# Patient Record
Sex: Female | Born: 1935 | Race: White | Hispanic: No | Marital: Married | Smoking: Never smoker
Health system: Southern US, Community
[De-identification: ages and names within clinical notes are randomized; demographics above are authoritative.]

## PROBLEM LIST (undated history)

## (undated) DIAGNOSIS — F0390 Unspecified dementia without behavioral disturbance: Secondary | ICD-10-CM

## (undated) DIAGNOSIS — I10 Essential (primary) hypertension: Secondary | ICD-10-CM

## (undated) DIAGNOSIS — M1990 Unspecified osteoarthritis, unspecified site: Secondary | ICD-10-CM

## (undated) DIAGNOSIS — C55 Malignant neoplasm of uterus, part unspecified: Secondary | ICD-10-CM

## (undated) DIAGNOSIS — E119 Type 2 diabetes mellitus without complications: Secondary | ICD-10-CM

## (undated) HISTORY — PX: ABDOMINAL HYSTERECTOMY: SHX81

## (undated) HISTORY — PX: FRACTURE SURGERY: SHX138

## (undated) HISTORY — PX: EYE SURGERY: SHX253

---

## 2004-10-23 ENCOUNTER — Ambulatory Visit: Payer: Self-pay | Admitting: Ophthalmology

## 2004-10-29 ENCOUNTER — Ambulatory Visit: Payer: Self-pay | Admitting: Ophthalmology

## 2005-04-26 ENCOUNTER — Emergency Department: Payer: Self-pay | Admitting: Emergency Medicine

## 2005-04-26 ENCOUNTER — Other Ambulatory Visit: Payer: Self-pay

## 2006-01-03 ENCOUNTER — Emergency Department: Payer: Self-pay | Admitting: Emergency Medicine

## 2008-06-13 ENCOUNTER — Emergency Department: Payer: Self-pay | Admitting: Emergency Medicine

## 2008-09-15 ENCOUNTER — Ambulatory Visit: Payer: Self-pay | Admitting: Unknown Physician Specialty

## 2009-12-09 ENCOUNTER — Emergency Department: Payer: Self-pay | Admitting: Emergency Medicine

## 2010-01-15 ENCOUNTER — Ambulatory Visit: Payer: Self-pay | Admitting: Gynecologic Oncology

## 2011-02-11 ENCOUNTER — Ambulatory Visit: Payer: Self-pay | Admitting: Ophthalmology

## 2011-02-11 DIAGNOSIS — Z01810 Encounter for preprocedural cardiovascular examination: Secondary | ICD-10-CM

## 2011-02-18 ENCOUNTER — Ambulatory Visit: Payer: Self-pay | Admitting: Ophthalmology

## 2011-03-30 ENCOUNTER — Emergency Department: Payer: Self-pay | Admitting: Emergency Medicine

## 2011-03-30 LAB — URINALYSIS, COMPLETE
Bilirubin,UR: NEGATIVE
Nitrite: NEGATIVE
Ph: 5 (ref 4.5–8.0)
Protein: NEGATIVE
Squamous Epithelial: 4
WBC UR: 101 /HPF (ref 0–5)

## 2011-03-30 LAB — COMPREHENSIVE METABOLIC PANEL
Alkaline Phosphatase: 64 U/L (ref 50–136)
BUN: 23 mg/dL — ABNORMAL HIGH (ref 7–18)
Bilirubin,Total: 0.4 mg/dL (ref 0.2–1.0)
Calcium, Total: 9.4 mg/dL (ref 8.5–10.1)
Chloride: 101 mmol/L (ref 98–107)
Co2: 29 mmol/L (ref 21–32)
Creatinine: 0.75 mg/dL (ref 0.60–1.30)
EGFR (African American): >60
EGFR (Non-African Amer.): >60
SGOT(AST): 21 U/L (ref 15–37)
SGPT (ALT): 17 U/L

## 2011-03-30 LAB — DRUG SCREEN, URINE
Amphetamines, Ur Screen: NEGATIVE (ref ?–1000)
Barbiturates, Ur Screen: NEGATIVE (ref ?–200)
Cocaine Metabolite,Ur ~~LOC~~: NEGATIVE (ref ?–300)
Opiate, Ur Screen: NEGATIVE (ref ?–300)
Phencyclidine (PCP) Ur S: NEGATIVE (ref ?–25)
Tricyclic, Ur Screen: NEGATIVE (ref ?–1000)

## 2011-03-30 LAB — CBC
HCT: 35.7 % (ref 35.0–47.0)
HGB: 11.9 g/dL — ABNORMAL LOW (ref 12.0–16.0)
MCH: 29.1 pg (ref 26.0–34.0)
MCHC: 33.3 g/dL (ref 32.0–36.0)
MCV: 87 fL (ref 80–100)
Platelet: 277 10*3/uL (ref 150–440)

## 2011-03-30 LAB — TSH: Thyroid Stimulating Horm: 0.52 u[IU]/mL

## 2011-03-30 LAB — ETHANOL: Ethanol: <3 mg/dL

## 2011-04-01 LAB — URINE CULTURE

## 2011-09-06 ENCOUNTER — Emergency Department: Payer: Self-pay | Admitting: Internal Medicine

## 2013-04-14 ENCOUNTER — Inpatient Hospital Stay: Payer: Self-pay | Admitting: Internal Medicine

## 2013-04-14 LAB — COMPREHENSIVE METABOLIC PANEL
ALK PHOS: 84 U/L
AST: 39 U/L — AB (ref 15–37)
Albumin: 4 g/dL (ref 3.4–5.0)
Anion Gap: 5 — ABNORMAL LOW (ref 7–16)
BUN: 13 mg/dL (ref 7–18)
Bilirubin,Total: 1.3 mg/dL — ABNORMAL HIGH (ref 0.2–1.0)
CO2: 30 mmol/L (ref 21–32)
Calcium, Total: 9.3 mg/dL (ref 8.5–10.1)
Chloride: 99 mmol/L (ref 98–107)
Creatinine: 0.75 mg/dL (ref 0.60–1.30)
EGFR (African American): >60
EGFR (Non-African Amer.): >60
Glucose: 147 mg/dL — ABNORMAL HIGH (ref 65–99)
Osmolality: 271 (ref 275–301)
POTASSIUM: 3.4 mmol/L — AB (ref 3.5–5.1)
SGPT (ALT): 29 U/L (ref 12–78)
Sodium: 134 mmol/L — ABNORMAL LOW (ref 136–145)
Total Protein: 7.9 g/dL (ref 6.4–8.2)

## 2013-04-14 LAB — URINALYSIS, COMPLETE
Bilirubin,UR: NEGATIVE
Glucose,UR: 50 mg/dL (ref 0–75)
KETONE: NEGATIVE
Leukocyte Esterase: NEGATIVE
Nitrite: NEGATIVE
Ph: 7 (ref 4.5–8.0)
Protein: NEGATIVE
RBC,UR: 3 /HPF (ref 0–5)
Specific Gravity: 1.013 (ref 1.003–1.030)
Squamous Epithelial: NONE SEEN
WBC UR: 11 /HPF (ref 0–5)

## 2013-04-14 LAB — CBC
HCT: 40.7 % (ref 35.0–47.0)
HGB: 13.4 g/dL (ref 12.0–16.0)
MCH: 29 pg (ref 26.0–34.0)
MCHC: 32.9 g/dL (ref 32.0–36.0)
MCV: 88 fL (ref 80–100)
Platelet: 171 10*3/uL (ref 150–440)
RBC: 4.63 10*6/uL (ref 3.80–5.20)
RDW: 13.2 % (ref 11.5–14.5)
WBC: 8.2 10*3/uL (ref 3.6–11.0)

## 2013-04-14 LAB — PROTIME-INR
INR: 1
PROTHROMBIN TIME: 13.2 s (ref 11.5–14.7)

## 2013-04-15 LAB — CBC WITH DIFFERENTIAL/PLATELET
BASOS PCT: 0.1 %
Basophil #: 0 10*3/uL (ref 0.0–0.1)
EOS ABS: 0 10*3/uL (ref 0.0–0.7)
Eosinophil %: 0 %
HCT: 36.5 % (ref 35.0–47.0)
HGB: 12.6 g/dL (ref 12.0–16.0)
LYMPHS ABS: 0.6 10*3/uL — AB (ref 1.0–3.6)
LYMPHS PCT: 7.2 %
MCH: 30.1 pg (ref 26.0–34.0)
MCHC: 34.5 g/dL (ref 32.0–36.0)
MCV: 87 fL (ref 80–100)
MONO ABS: 0.4 x10 3/mm (ref 0.2–0.9)
Monocyte %: 4.5 %
NEUTROS PCT: 88.2 %
Neutrophil #: 7.2 10*3/uL — ABNORMAL HIGH (ref 1.4–6.5)
Platelet: 164 10*3/uL (ref 150–440)
RBC: 4.17 10*6/uL (ref 3.80–5.20)
RDW: 13.3 % (ref 11.5–14.5)
WBC: 8.1 10*3/uL (ref 3.6–11.0)

## 2013-04-15 LAB — BASIC METABOLIC PANEL
ANION GAP: 7 (ref 7–16)
BUN: 14 mg/dL (ref 7–18)
CHLORIDE: 101 mmol/L (ref 98–107)
CO2: 25 mmol/L (ref 21–32)
Calcium, Total: 9.1 mg/dL (ref 8.5–10.1)
Creatinine: 0.86 mg/dL (ref 0.60–1.30)
EGFR (Non-African Amer.): >60
GLUCOSE: 122 mg/dL — AB (ref 65–99)
OSMOLALITY: 268 (ref 275–301)
Potassium: 3.4 mmol/L — ABNORMAL LOW (ref 3.5–5.1)
Sodium: 133 mmol/L — ABNORMAL LOW (ref 136–145)

## 2013-04-15 LAB — MAGNESIUM: Magnesium: 1.5 mg/dL — ABNORMAL LOW

## 2013-04-16 LAB — BASIC METABOLIC PANEL
ANION GAP: 5 — AB (ref 7–16)
BUN: 13 mg/dL (ref 7–18)
Calcium, Total: 8.4 mg/dL — ABNORMAL LOW (ref 8.5–10.1)
Chloride: 104 mmol/L (ref 98–107)
Co2: 26 mmol/L (ref 21–32)
Creatinine: 0.78 mg/dL (ref 0.60–1.30)
EGFR (African American): >60
EGFR (Non-African Amer.): >60
GLUCOSE: 108 mg/dL — AB (ref 65–99)
Osmolality: 271 (ref 275–301)
Potassium: 3.6 mmol/L (ref 3.5–5.1)
SODIUM: 135 mmol/L — AB (ref 136–145)

## 2013-04-16 LAB — MAGNESIUM: Magnesium: 1.7 mg/dL — ABNORMAL LOW

## 2013-04-16 LAB — URINE CULTURE

## 2013-04-16 LAB — HEMOGLOBIN: HGB: 10.6 g/dL — ABNORMAL LOW (ref 12.0–16.0)

## 2013-04-16 LAB — PLATELET COUNT: PLATELETS: 124 10*3/uL — AB (ref 150–440)

## 2013-04-17 ENCOUNTER — Ambulatory Visit: Payer: Self-pay | Admitting: Neurology

## 2013-04-21 LAB — PATHOLOGY REPORT

## 2014-07-08 NOTE — Op Note (Signed)
PATIENT NAME:  Melinda Hughes, BORRAYO MR#:  [id number] DATE OF BIRTH:  Oct 13, 1931  DATE OF PROCEDURE:  04/15/2013  PREOPERATIVE DIAGNOSES: Left femoral neck fracture and osteoarthritis.   POSTOPERATIVE DIAGNOSES: Left femoral neck fracture and osteoarthritis.   PROCEDURE: Left total hip replacement.   ANESTHESIA: Spinal.   SURGEON: Hessie Knows, M.D.   DESCRIPTION OF PROCEDURE: The patient was brought to the Operating Room and, after adequate anesthesia was obtained, the patient was placed on the operative table with the right leg on a well padded table, left foot in the Medacta attachment. After appropriate positioning, C-arm was brought in, and with traction view, initial landmarks were obtained for subsequent procedure in comparison with implants. The hip was then prepped and draped using the usual sterile fashion. Appropriate patient identification and timeout procedures were completed.   Direct anterior approach was made, centered over the tensor fascia lata muscle and the greater trochanter. The tensor fascia was incised and the muscle retracted laterally. The deep fascia was then incised and the lateral femoral circumflex vessels were identified and ligated. The anterior capsule was then exposed, and an anterior flap was created, based superior laterally, and a deep Charnley retractor placed. Femoral neck cut was carried out just distal to the fracture site, and the head was removed.   There was moderately advanced femoral head osteoarthritis. Acetabulum was significant for moderate synovitis. A portion of the labrum was excised to give adequate exposure of the acetabulum, and sequential reaming was carried out to 50 mm. A 50 mm trial fit well, and the 50 mm Versafit cup DM was impacted into place.   Next, the leg was externally rotated, and the ischiofemoral and pubofemoral ligaments were released. The leg was then dropped into extension with adduction, and sequential broaching was carried out to a #6  AMIS stem, which had a very nice fit proximally. With the #6 stem in place, broach in place, trials were obtained, and comparison to the initial films made, with an M 28 mm head and the appropriate liner for the 50 mm cup. The #6 stem was impacted down the canal, and bipolar dual mobility head was then impacted onto the stem. The hip was reduced, and the leg lengths appeared appropriate. The wound was thoroughly irrigated. The hip was stable to a 90 degree external rotation test. The wound was then closed with a running heavy Quill, 2-0 Quill subcutaneously and skin staples. Xeroform, 4 x 4's, ABD and tape applied.   ESTIMATED BLOOD LOSS: 350 mL.   COMPLICATIONS: None.   SPECIMEN: Removed femoral head.   IMPLANTS: Medacta AMIS size 6 standard stem with a 50 mm dual mobility cup with appropriate liner and an M 28 mm head.    ____________________________ Laurene Footman, MD mjm:cg D: 04/15/2013 20:46:15 ET T: 04/16/2013 06:54:57 ET JOB#: [id number]  cc: Laurene Footman, MD, <Dictator> Laurene Footman MD ELECTRONICALLY SIGNED 04/18/2013 8:24

## 2014-07-08 NOTE — Discharge Summary (Signed)
PATIENT NAME:  Melinda Hughes, Melinda Hughes MR#:  [id number] DATE OF BIRTH:  1932-02-14  DATE OF ADMISSION:  04/14/2013 DATE OF DISCHARGE:  04/18/2013  ADDENDUM TO DISCHARGE SUMMARY   DISCHARGE MEDICATIONS: Correct lisinopril 20 mg daily. Remainder of drugs remain unchanged.    ____________________________ Venetia Maxon Elijio Miles, MD sat:mr D: 04/18/2013 14:19:00 ET T: 04/18/2013 20:01:31 ET JOB#: [id number]  cc: Sheikh A. Elijio Miles, MD, <Dictator> Veverly Fells MD ELECTRONICALLY SIGNED 04/19/2013 13:59

## 2014-07-08 NOTE — Discharge Summary (Signed)
PATIENT NAME:  Melinda Hughes, Melinda Hughes MR#:  [id number] DATE OF BIRTH:  1932-03-03  DATE OF ADMISSION:  04/14/2013 DATE OF DISCHARGE:  04/18/2013  DISCHARGING PHYSICIAN:  Dr. Elijio Miles.   CONSULTATIONS:  Orthopedics.   DIAGNOSIS:  Left hip fracture.   PROCEDURE: Left hip internal fixation.   DISCHARGE MEDICATIONS:  1.  Oxycodone 5 mg 2 tabs every 4 hours p.r.n. for pain.  2.  Tramadol 50 mg 1 to 2 tabs every 4 hours for pain.  3.  Enoxaparin 30 mg subcutaneous daily.  4.  Lisinopril 10 mg daily.   HOSPITAL COURSE: This lady with past medical history of Alzheimer dementia sustained a mechanical fall and fractured her left hip. Please refer to the history and physical for full details. She was diagnosed a left hip fracture. Orthopedic consultation was placed, and the patient underwent a left hip internal fixation on 04/15/2013 by Dr. Marry Guan. The procedure was uncomplicated, notable for blood loss anemia, which did not require blood transfusion.   She had a syncopal episode, was on the commode with tonic-clonic movement of right limbs due to a possible vasovagal event. Neurology consultation was placed. CT scan of the head was negative for acute intracranial abnormality. Neurology has not deemed any further neuroimaging necessary. The patient remained stable subsequently without any further syncope or tonic-clonic movements. The patient'Hoke Baer blood pressure was elevated on admission. The blood pressure medications were adjusted, namely several additional antihypertensives were introduced to her care; however, following her surgery, the patient'Mane Consolo blood pressure normalized. She was taken off her hydrochlorothiazide, as she was hyponatremic at the time of admission and her blood pressure was controlled with lisinopril.   The patient was discharged to a skilled nursing facility in satisfactory condition.   DISCHARGE INSTRUCTIONS:  Diet: Low sodium. Activity: The patient is to continue physical therapy and rehab.    Follow up with orthopedics in the skilled nursing facility and follow up with her primary care physician, Dr. Lamonte Sakai, in 1 to 2 weeks following discharge from skilled nursing.   DISCHARGE PROCESS TIME SPENT: 33 minutes.   ____________________________ Venetia Maxon Elijio Miles, MD sat:dmm D: 04/18/2013 14:10:36 ET T: 04/18/2013 19:41:18 ET JOB#: [id number]  cc: Sheikh A. Elijio Miles, MD, <Dictator> Veverly Fells MD ELECTRONICALLY SIGNED 04/19/2013 13:59

## 2014-07-08 NOTE — Consult Note (Signed)
Brief Consult Note: Diagnosis: displaced left femoral neck fracture.   Orders entered.   Comments: plan left [facility] tomorrow with anterior approach will discuss with family in am.  Electronic Signatures: Laurene Footman (MD)  (Signed 29-Jan-15 20:48)  Authored: Brief Consult Note   Last Updated: 29-Jan-15 20:48 by Laurene Footman (MD)

## 2014-07-08 NOTE — H&P (Signed)
PATIENT NAME:  Melinda Hughes, Melinda Hughes MR#:  [id number] DATE OF BIRTH:  [birth year]  DATE OF ADMISSION:  04/14/2013  ADMITTING PHYSICIAN: Samantha Crimes, M.D.   PRIMARY CARE PHYSICIAN: Lamonte Sakai.   CHIEF COMPLAINT: Fall and left hip pain.   HISTORY OF PRESENT ILLNESS: Melinda Hughes is a 81 year old Caucasian female with past medical history significant for hypertension, diet-controlled diabetes mellitus, dementia, arthritis  and gastroesophageal reflux disease, who lives at home with her husband, was brought in secondary to a fall that she had today. The patient has dementia and also received pain medication and most of the history is obtained from husband at bedside. According to him, the patient is usually well balanced when she walks. Her trouble is only with short-term memory. She recognizes family, able to maintain a conversation. However, she lost her balance and tripped on something and fell. The husband denies any loss of consciousness hitting her head. After the fall she complained of left hip pain and was brought to the ER, and x-ray showed an angulated, impacted left hip intertrochanteric fracture. According to the husband, the patient never complained of any chest pain or shortness of breath. She ambulates well and can also climb steps, at least 1 flight of stairs. She does not have any cardiac history. The patient only has medication for hypertension at home, which has not been changed in the last couple of years. They have been calling PCP's office just to get refill of the medication. Her blood pressure was elevated in the ER at 236/105.   PAST MEDICAL HISTORY: 1.  Hypertension.  2.  Dementia.  3.  Diet-controlled diabetes mellitus.  4.  Gastroesophageal reflux disease.  5.  Osteoarthritis.  6.  Colitis as a child.   PAST SURGICAL HISTORY: 1.  Bilateral cataract surgery.  2.  Hysterectomy done endometrial cancer at [facility]: ASPIRIN, CELEBREX, CODEINE,  MORPHINE, MOTRIN, SULFA DRUGS, VIOXX.  CURRENT MEDICATIONS:  Lisinopril/hydrochlorothiazide 10/12.5 mg 1 tablet p.o. daily.   SOCIAL HISTORY: Lives at home with her husband. Ambulates well at baseline. No history of any smoking or alcohol use.   FAMILY HISTORY: Heart disease runs in the family.   REVIEW OF SYSTEMS: Difficult to be obtained as the patient has dementia and also currently sedated with pain medications.   PHYSICAL EXAMINATION: VITAL SIGNS: Temperature 98.2 degrees Fahrenheit, pulse 68, respirations 18, blood pressure 236/105, pulse ox 98% on room air.  GENERAL: Well-built, well-nourished female lying in bed, not in any acute distress.  HEENT: Normocephalic, atraumatic. Pupils postsurgical, equal, round, reacting to light. Anicteric sclerae. Extraocular movements intact. Oropharynx clear without erythema, mass or exudates.  NECK: Supple. No thyromegaly, JVD or carotid bruits. No lymphadenopathy.  LUNGS: Moving air bilaterally. No wheeze or crackles. No use of accessory muscles for breathing. Decreased bibasilar breath sounds.  CARDIOVASCULAR: S1, S2 regular rate and rhythm. 2/6 systolic murmur heard. No rubs or gallops.  ABDOMEN: Soft, nontender, nondistended. No hepatosplenomegaly. Normal bowel sounds.  EXTREMITIES: Left hip is externally rotated and extended and placed in guarding position. No bruits or ecchymosis noted in the hip area. Pulses are 2+ palpable bilaterally. No clubbing or cyanosis. No pedal edema noted.  SKIN: No acne, rash or lesions.  LYMPHATICS: No cervical lymphadenopathy.  NEUROLOGIC: Cranial nerves II through XII remain intact. Motor strength is 5/5 in all 3 extremities; left leg not tested due to her fracture.  Sensation seems to be intact.  PSYCHOLOGICAL: The patient is alert, oriented x 2 at  this time.   LABORATORY DATA: WBC 8.3, hemoglobin 13.4, hematocrit 40.7, platelet count 171.   Sodium 134, potassium 3.4, chloride 99, bicarbonate 30, BUN 13,  creatinine 0.75, glucose 147 and calcium of 9.3.   ALT 29, AST 39, alk phos 34, total bili 1.3 and albumin of 4.0. INR 1.0. Urinalysis with few WBCs and 1+ bacteria.  CT of the head without contrast showing no acute intracranial process and moderate white matter changes suggesting chronic small vessel ischemic disease with right thalamic lacunar infarct and bilateral temporal encephalomalacia noted, which is chronic. Left hip x-ray showing angulated impacted left femoral subcapital fracture without dislocation, and chest x-ray showing cardiomegaly and hyperinflation. No acute process seen.  EKG showing normal sinus rhythm, heart rate of 69. No acute ST-T wave abnormalities noted.   ASSESSMENT AND PLAN: This is a 81 year old female with past medical history significant for hypertension, dementia, brought in after a fall and noted to have a left hip fracture.  1.  Preop eval for left hip fracture, active at baseline with no cardiac history. Blood pressure is elevated. We will need to optimize prior to surgery. Otherwise, intermediate risk and can  proceed with surgery as benefits outweigh the risks. Ortho has been consulted. Postop pain control and physical therapy after surgery.  2.  Malignant hypertension. Started on IV hydralazine p.r.n. and also continue on lisinopril, added metoprolol and Norvasc. Hydrochlorothiazide held, as the patient appears dehydrated and also hypokalemic.  3.  Severe dementia, appears to be at baseline.  4.  Urinary tract infection. We will give fluids and start her on Levaquin empirically.  5.  Deep vein thrombosis prophylaxis will be started after surgery, as per ortho recommendations.   TOTAL TIME SPENT ON ADMISSION: 50 minutes.     ____________________________ Gladstone Lighter, MD rk:dmm D: 04/14/2013 21:28:13 ET T: 04/14/2013 22:00:52 ET JOB#: [id number]  cc: Gladstone Lighter, MD, <Dictator> Laurene Footman, MD Perrin Maltese, MD Gladstone Lighter  MD ELECTRONICALLY SIGNED 04/21/2013 14:57

## 2014-07-08 NOTE — Consult Note (Signed)
PATIENT NAME:  Melinda Hughes, WISWELL MR#:  [id number] DATE OF BIRTH:  [birth year]  DATE OF CONSULTATION:  04/17/2013  REFERRING PHYSICIAN:   CONSULTING PHYSICIAN:  Leotis Pain, MD  REASON FOR CONSULTATION:  Syncopal event, rule out seizure activity.   HISTORY OF PRESENT ILLNESS:  A pleasant 81 year old Caucasian female with past medical history of hypertension, diabetes, arthritis, gastroesophageal reflux disease, lives at home at baseline with history of dementia, presents status post fall. The patient was found to have left hip fracture, status post total hip replacement. Reason for neurological evaluation is the patient is status post what appears to be a syncopal event. Information is obtained from patient's daughter, who was at bedside. The patient's daughter states that the patient was sitting on the commode bearing down and then she appeared to slump down, slide off the commode, had stiffening event of her bilateral upper extremities. She needed to be assisted to the bed and once she was assisted to the bed the patient was back to her baseline, her normal self. No tongue biting, urinary incontinence or any generalized shaking activity. No prior history of similar event in the past.   REVIEW OF SYSTEMS: Unable to obtain due to the patient's chronic history of dementia.   PAST MEDICAL HISTORY: Hypertension, dementia, diabetes, gastroesophageal reflux disease, osteoarthritis, colitis as a child.   PAST SURGICAL HISTORY: Bilateral cataracts, hysterectomy, recent left total hip replacement due to her fall.   MEDICATIONS: At home lisinopril/hydrochlorothiazide 10/12.5 daily.   SOCIAL HISTORY: Lives at home with her husband. Has history of dementia.   FAMILY HISTORY:  Heart disease runs in the family.   LABORATORY, DIAGNOSTIC AND RADIOLOGICAL DATA:  CAT scan of the head that was done after the fall did not show any acute intracranial abnormality. Glucose 108, BUN 13, creatinine is 78, sodium 135,  potassium 3.6, mag is low at 1.7 and should be replaced.   PHYSICAL EXAMINATION: VITAL SIGNS: Temperature 99, pulse 70, respirations 18, blood pressure 131/72, pulse ox 95%.  NEUROLOGICAL: The patient is sleepy but easily arousable. Can tell me her name, appears to be confused to date, time. Extraocular movements are intact. Visual fields appear intact. Facial sensation intact. Tongue is midline. Shoulder shrug intact. Motor strength is symmetrical bilateral upper extremities. Unable to assess because of left total hip replacement in the left lower extremity and within normal limits, about 4+ out of 5 right lower extremity. Gait not assessed. Coordination intact. Reflexes not assessed.   IMPRESSION: A 81 year old Caucasian female with past medical history of hypertension, diet-controlled diabetes, dementia, arthritis, gastroesophageal reflux disease, presenting status post fall, found to have a left hip fracture, status post total hip replacement. The patient was sitting down on a commode today, bore down and what appears to be a syncopal event. It appears that the patient's history is consistent with possibly convulsive syncope. I agree with discontinuing tramadol as it does lower seizure threshold. She did not have any tongue biting, urinary incontinence. No prior to history of seizure in the past. She probably had preload reduction due to bearing down and the patient came back to her herself very rapidly.   PLAN: No need for EEG monitoring nor antiepileptic medication. No further imaging from a neurological standpoint as, again, believe this is syncope, probably convulsive syncope in nature. This case discussed with the patient's family members who are at bedside.   Thank you. Please call with any questions.    ____________________________ Leotis Pain, MD yz:cs D: 04/17/2013 13:56:27 ET T:  04/17/2013 15:55:02 ET JOB#: [id number]  cc: Leotis Pain, MD, <Dictator> Leotis Pain  MD ELECTRONICALLY SIGNED 05/09/2013 13:12
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
## 2014-07-14 DIAGNOSIS — R21 Rash and other nonspecific skin eruption: Secondary | ICD-10-CM | POA: Diagnosis not present

## 2014-12-14 ENCOUNTER — Encounter: Payer: Self-pay | Admitting: Emergency Medicine

## 2014-12-14 ENCOUNTER — Emergency Department: Payer: Medicare Other

## 2014-12-14 ENCOUNTER — Emergency Department
Admission: EM | Admit: 2014-12-14 | Discharge: 2014-12-14 | Disposition: A | Discharge status: Home / Self Care | Payer: Medicare Other | Attending: Emergency Medicine | Admitting: Emergency Medicine

## 2014-12-14 DIAGNOSIS — I10 Essential (primary) hypertension: Secondary | ICD-10-CM | POA: Insufficient documentation

## 2014-12-14 DIAGNOSIS — S63501A Unspecified sprain of right wrist, initial encounter: Secondary | ICD-10-CM | POA: Diagnosis not present

## 2014-12-14 DIAGNOSIS — Y9289 Other specified places as the place of occurrence of the external cause: Secondary | ICD-10-CM | POA: Insufficient documentation

## 2014-12-14 DIAGNOSIS — Y9389 Activity, other specified: Secondary | ICD-10-CM | POA: Diagnosis not present

## 2014-12-14 DIAGNOSIS — E119 Type 2 diabetes mellitus without complications: Secondary | ICD-10-CM | POA: Insufficient documentation

## 2014-12-14 DIAGNOSIS — S6991XA Unspecified injury of right wrist, hand and finger(s), initial encounter: Secondary | ICD-10-CM | POA: Diagnosis present

## 2014-12-14 DIAGNOSIS — F0390 Unspecified dementia without behavioral disturbance: Secondary | ICD-10-CM | POA: Insufficient documentation

## 2014-12-14 DIAGNOSIS — W010XXA Fall on same level from slipping, tripping and stumbling without subsequent striking against object, initial encounter: Secondary | ICD-10-CM | POA: Diagnosis not present

## 2014-12-14 DIAGNOSIS — Y998 Other external cause status: Secondary | ICD-10-CM | POA: Insufficient documentation

## 2014-12-14 HISTORY — DX: Essential (primary) hypertension: I10

## 2014-12-14 HISTORY — DX: Type 2 diabetes mellitus without complications: E11.9

## 2014-12-14 HISTORY — DX: Unspecified osteoarthritis, unspecified site: M19.90

## 2014-12-14 HISTORY — DX: Unspecified dementia, unspecified severity, without behavioral disturbance, psychotic disturbance, mood disturbance, and anxiety: F03.90

## 2014-12-14 NOTE — ED Provider Notes (Signed)
[facility] Emergency Department Provider Note    ____________________________________________  Time seen: On EMS arrival  I have reviewed the triage vital signs and the nursing notes.   HISTORY  Chief Complaint Fall, right wrist pain  History limited by: Dementia   HPI Melinda Hughes is a 81 y.o. female who presents to the emergency department today via EMS after a fall. The patient does have a history of dementia is unable to give much history. However EMS does state that the patient had a fall after being tripped up by the dog. She fell backwards and put her right hand out to catch herself. She was complaining of right wrist pain since the accident. It is mild. The patient did not have any loss of consciousness or hit her head. Patient herself states that she currently has no pain.  Past Medical History  Diagnosis Date  . Dementia   . Diabetes mellitus without complication   . Hypertension   . Arthritis   . Cancer determined by uterine cervix biopsy     There are no active problems to display for this patient.   Past Surgical History  Procedure Laterality Date  . Abdominal hysterectomy    . Eye surgery    . Fracture surgery      No current outpatient prescriptions on file.  Allergies Morphine and related and Sulfa antibiotics  History reviewed. No pertinent family history.  Social History Social History  Substance Use Topics  . Smoking status: Never Smoker   . Smokeless tobacco: None  . Alcohol Use: No    Review of Systems  Constitutional: Negative for fever. Cardiovascular: Negative for chest pain. Respiratory: Negative for shortness of breath. Gastrointestinal: Negative for abdominal pain, vomiting and diarrhea. Genitourinary: Negative for dysuria. Musculoskeletal: Right wrist pain Skin: Negative for rash. Neurological: Negative for headaches, focal weakness or numbness.  10-point ROS otherwise  negative.  ____________________________________________   PHYSICAL EXAM:  VITAL SIGNS:   97.7 F (36.5 C)  70  --   193/93 mmHg  98 %     Constitutional: Awake and alert, pleasantly demented Eyes: Conjunctivae are normal. PERRL. Normal extraocular movements. ENT   Head: Normocephalic and atraumatic.   Nose: No congestion/rhinnorhea.   Mouth/Throat: Mucous membranes are moist.   Neck: No stridor. No midline tenderness Hematological/Lymphatic/Immunilogical: No cervical lymphadenopathy. Cardiovascular: Normal rate, regular rhythm.  No murmurs, rubs, or gallops. Respiratory: Normal respiratory effort without tachypnea nor retractions. Breath sounds are clear and equal bilaterally. No wheezes/rales/rhonchi. Gastrointestinal: Soft and nontender. No distention.  Genitourinary: Deferred Musculoskeletal: Normal range of motion in all extremities. No joint effusions.  No lower extremity tenderness nor edema. Some tenderness to palpation of the right wrist. No obvious deformity. Skin intact. Pelvis stable Neurologic:  Normal speech and language. No gross focal neurologic deficits are appreciated. Speech is normal.  Skin:  Skin is warm, dry and intact. No rash noted. Psychiatric: Pleasantly demented ____________________________________________    LABS (pertinent positives/negatives)  None  ____________________________________________   EKG  None  ____________________________________________    RADIOLOGY  Right wrist x-ray IMPRESSION: 1. No acute radiographic abnormality of the right wrist. 2. Extensive degenerative changes of osteoarthritis, as above.  I, GOODMAN, GRAYDON, personally viewed and evaluated these images (plain radiographs) as part of my medical decision making. ____________________________________________   PROCEDURES  Procedure(s) performed: None  Critical Care performed: No  ____________________________________________   INITIAL  IMPRESSION / ASSESSMENT AND PLAN / ED COURSE  Pertinent labs & imaging results that were  available during my care of the patient were reviewed by me and considered in my medical decision making (see chart for details).  Patient plan of right wrist pain after a fall. X-rays without any fractures. Think likely this patient suffered sprain  ____________________________________________   FINAL CLINICAL IMPRESSION(S) / ED DIAGNOSES  Final diagnoses:  Wrist sprain, right, initial encounter     Nance Pear, MD 12/14/14 [id number]

## 2014-12-14 NOTE — Discharge Instructions (Signed)
Please seek medical attention for any high fevers, chest pain, shortness of breath, change in behavior, persistent vomiting, bloody stool or any other new or concerning symptoms.    Wrist Pain Wrist injuries are frequent in adults and children. A sprain is an injury to the ligaments that hold your bones together. A strain is an injury to muscle or muscle cord-like structures (tendons) from stretching or pulling. Generally, when wrists are moderately tender to touch following a fall or injury, a break in the bone (fracture) may be present. Most wrist sprains or strains are better in 3 to 5 days, but complete healing may take several weeks. HOME CARE INSTRUCTIONS   Put ice on the injured area.  Put ice in a plastic bag.  Place a towel between your skin and the bag.  Leave the ice on for 15-20 minutes, 3-4 times a day, for the first 2 days, or as directed by your health care provider.  Keep your arm raised above the level of your heart whenever possible to reduce swelling and pain.  Rest the injured area for at least 48 hours or as directed by your health care provider.  If a splint or elastic bandage has been applied, use it for as long as directed by your health care provider or until seen by a health care provider for a follow-up exam.  Only take over-the-counter or prescription medicines for pain, discomfort, or fever as directed by your health care provider.  Keep all follow-up appointments. You may need to follow up with a specialist or have follow-up X-rays. Improvement in pain level is not a guarantee that you did not fracture a bone in your wrist. The only way to determine whether or not you have a broken bone is by X-ray. SEEK IMMEDIATE MEDICAL CARE IF:   Your fingers are swollen, very red, white, or cold and blue.  Your fingers are numb or tingling.  You have increasing pain.  You have difficulty moving your fingers. MAKE SURE YOU:   Understand these instructions.  Will  watch your condition.  Will get help right away if you are not doing well or get worse. Document Released: 12/11/2004 Document Revised: 03/08/2013 Document Reviewed: 04/24/2010 [facility] Patient Information 2015 [place], [place]. This information is not intended to replace advice given to you by your health care provider. Make sure you discuss any questions you have with your health care provider.

## 2014-12-14 NOTE — ED Notes (Signed)
Ems from home s/p fall. Pt with dementia, poor historian. Fall was unwitnessed per husband. Pt c/o right wrist pain.

## 2015-05-31 ENCOUNTER — Emergency Department: Payer: Medicare Other

## 2015-05-31 ENCOUNTER — Emergency Department
Admission: EM | Admit: 2015-05-31 | Discharge: 2015-05-31 | Disposition: A | Discharge status: Home / Self Care | Payer: Medicare Other | Attending: Emergency Medicine | Admitting: Emergency Medicine

## 2015-05-31 DIAGNOSIS — S0591XA Unspecified injury of right eye and orbit, initial encounter: Secondary | ICD-10-CM | POA: Insufficient documentation

## 2015-05-31 DIAGNOSIS — F0390 Unspecified dementia without behavioral disturbance: Secondary | ICD-10-CM | POA: Diagnosis not present

## 2015-05-31 DIAGNOSIS — R410 Disorientation, unspecified: Secondary | ICD-10-CM | POA: Diagnosis not present

## 2015-05-31 DIAGNOSIS — R531 Weakness: Secondary | ICD-10-CM | POA: Diagnosis not present

## 2015-05-31 DIAGNOSIS — E119 Type 2 diabetes mellitus without complications: Secondary | ICD-10-CM | POA: Diagnosis not present

## 2015-05-31 DIAGNOSIS — Y9389 Activity, other specified: Secondary | ICD-10-CM | POA: Diagnosis not present

## 2015-05-31 DIAGNOSIS — Y9289 Other specified places as the place of occurrence of the external cause: Secondary | ICD-10-CM | POA: Diagnosis not present

## 2015-05-31 DIAGNOSIS — Y998 Other external cause status: Secondary | ICD-10-CM | POA: Insufficient documentation

## 2015-05-31 DIAGNOSIS — I10 Essential (primary) hypertension: Secondary | ICD-10-CM | POA: Diagnosis not present

## 2015-05-31 DIAGNOSIS — W1839XA Other fall on same level, initial encounter: Secondary | ICD-10-CM | POA: Diagnosis not present

## 2015-05-31 DIAGNOSIS — W19XXXA Unspecified fall, initial encounter: Secondary | ICD-10-CM

## 2015-05-31 LAB — COMPREHENSIVE METABOLIC PANEL
ALBUMIN: 3.9 g/dL (ref 3.5–5.0)
ALT: 13 U/L — ABNORMAL LOW (ref 14–54)
AST: 22 U/L (ref 15–41)
Alkaline Phosphatase: 75 U/L (ref 38–126)
Anion gap: 7 (ref 5–15)
BUN: 33 mg/dL — ABNORMAL HIGH (ref 6–20)
CHLORIDE: 106 mmol/L (ref 101–111)
CO2: 25 mmol/L (ref 22–32)
Calcium: 9.3 mg/dL (ref 8.9–10.3)
Creatinine, Ser: 0.87 mg/dL (ref 0.44–1.00)
GFR calc Af Amer: >60 mL/min (ref >60)
GFR calc non Af Amer: >60 mL/min (ref >60)
GLUCOSE: 128 mg/dL — AB (ref 65–99)
POTASSIUM: 4.1 mmol/L (ref 3.5–5.1)
SODIUM: 138 mmol/L (ref 135–145)
TOTAL PROTEIN: 7.2 g/dL (ref 6.5–8.1)
Total Bilirubin: 0.8 mg/dL (ref 0.3–1.2)

## 2015-05-31 LAB — URINALYSIS COMPLETE WITH MICROSCOPIC (ARMC ONLY)
Bacteria, UA: NONE SEEN
Bilirubin Urine: NEGATIVE
GLUCOSE, UA: NEGATIVE mg/dL
Ketones, ur: NEGATIVE mg/dL
LEUKOCYTES UA: NEGATIVE
Nitrite: NEGATIVE
PH: 5 (ref 5.0–8.0)
Protein, ur: NEGATIVE mg/dL
SPECIFIC GRAVITY, URINE: 1.024 (ref 1.005–1.030)

## 2015-05-31 LAB — CBC WITH DIFFERENTIAL/PLATELET
BASOS ABS: 0 10*3/uL (ref 0–0.1)
BASOS PCT: 0 %
EOS ABS: 0 10*3/uL (ref 0–0.7)
Eosinophils Relative: 0 %
HEMATOCRIT: 37 % (ref 35.0–47.0)
Hemoglobin: 12.6 g/dL (ref 12.0–16.0)
LYMPHS PCT: 9 %
Lymphs Abs: 0.6 10*3/uL — ABNORMAL LOW (ref 1.0–3.6)
MCH: 28.9 pg (ref 26.0–34.0)
MCHC: 34.2 g/dL (ref 32.0–36.0)
MCV: 84.7 fL (ref 80.0–100.0)
MONO ABS: 0.3 10*3/uL (ref 0.2–0.9)
Monocytes Relative: 4 %
NEUTROS ABS: 6 10*3/uL (ref 1.4–6.5)
Neutrophils Relative %: 87 %
PLATELETS: 202 10*3/uL (ref 150–440)
RBC: 4.37 MIL/uL (ref 3.80–5.20)
RDW: 14.2 % (ref 11.5–14.5)
WBC: 7 10*3/uL (ref 3.6–11.0)

## 2015-05-31 NOTE — ED Provider Notes (Signed)
[facility] Emergency Department Provider Note     Time seen: ----------------------------------------- 9:04 AM on 05/31/2015 -----------------------------------------  L5 caveat: Review of systems and history is limited by dementia  I have reviewed the triage vital signs and the nursing notes.   HISTORY  Chief Complaint Fall    HPI Carrin Bartz is a 82 y.o. female who presents to ER for a fall that was unwitnessed. She comes from home, reportedly she has dementia. On arrival she was alert and talkative but prior had been responding only to painful stimuli. She has a healing wound above her right eye from a fall 10 days ago that she was not seen for. Patient is confused but denies complaints currently.   Past Medical History  Diagnosis Date  . Dementia   . Diabetes mellitus without complication ([place])   . Hypertension   . Arthritis   . Cancer determined by uterine cervix biopsy ([place])     There are no active problems to display for this patient.   Past Surgical History  Procedure Laterality Date  . Abdominal hysterectomy    . Eye surgery    . Fracture surgery      Allergies Morphine and related and Sulfa antibiotics  Social History Social History  Substance Use Topics  . Smoking status: Never Smoker   . Smokeless tobacco: None  . Alcohol Use: No    Review of Systems Unknown at this time  ____________________________________________   PHYSICAL EXAM:  VITAL SIGNS: ED Triage Vitals  Enc Vitals Group     BP 05/31/15 0902 137/82 mmHg     Pulse Rate 05/31/15 0900 84     Resp 05/31/15 0900 17     Temp 05/31/15 0900 97.9 F (36.6 C)     Temp Source 05/31/15 0900 Oral     SpO2 05/31/15 0900 100 %     Weight 05/31/15 0900 160 lb (72.576 kg)     Height 05/31/15 0900 5\' 4"  (1.626 m)     Head Cir --      Peak Flow --      Pain Score --      Pain Loc --      Pain Edu? --      Excl. in [place]? --     Constitutional: Drowsy and  disoriented. No distress. Eyes: Conjunctivae are normal. PERRL. Normal extraocular movements. ENT   Head: Healing laceration as noted superior to the right periorbital area   Nose: No congestion/rhinnorhea.   Mouth/Throat: Mucous membranes are moist.   Neck: No stridor. Cardiovascular: Normal rate, regular rhythm. Normal and symmetric distal pulses are present in all extremities. No murmurs, rubs, or gallops. Respiratory: Normal respiratory effort without tachypnea nor retractions. Breath sounds are clear and equal bilaterally. No wheezes/rales/rhonchi. Gastrointestinal: Soft and nontender.  Musculoskeletal: Limited range of motion of the extremities, no focal tenderness is noted. Neurologic:  Normal speech and language. No gross focal neurologic deficits are appreciated.  Skin:  Erythema is noted bilaterally on the face. Psychiatric: Mood and affect are normal. Speech and behavior are normal. Patient exhibits appropriate insight and judgment. ____________________________________________  ED COURSE:  Pertinent labs & imaging results that were available during my care of the patient were reviewed by me and considered in my medical decision making (see chart for details). Patient with unwitnessed fall, will check basic labs, CT head and urinalysis. ____________________________________________    LABS (pertinent positives/negatives)  Labs Reviewed  CBC WITH DIFFERENTIAL/PLATELET - Abnormal; Notable for the following:  Lymphs Abs 0.6 (*)    All other components within normal limits  COMPREHENSIVE METABOLIC PANEL - Abnormal; Notable for the following:    Glucose, Bld 128 (*)    BUN 33 (*)    ALT 13 (*)    All other components within normal limits  URINALYSIS COMPLETEWITH MICROSCOPIC ([facility] ONLY) - Abnormal; Notable for the following:    Color, Urine YELLOW (*)    APPearance CLEAR (*)    Hgb urine dipstick 1+ (*)    Squamous Epithelial / LPF 0-5 (*)    All other  components within normal limits    RADIOLOGY Images were viewed by me  CT head IMPRESSION: Mild atrophy with patchy periventricular small vessel disease. 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 No acute infarct evident. No intracranial mass, hemorrhage, or extra-axial fluid collection. Air-fluid level is noted in the left sphenoid sinus Region.  IMPRESSION: No acute abnormality seen.  ____________________________________________  FINAL ASSESSMENT AND PLAN  Fall, lethargy  Plan: Patient with labs and imaging as dictated above. Family states the patient did not sleep all night and that is why she is more drowsy than normal. They feel like she is acting at her baseline she just fatigued. Her CT and labs are all normal. I advise them to return for worsening or worrisome symptoms.   Earleen Newport, MD   Earleen Newport, MD 05/31/15 [phone]

## 2015-05-31 NOTE — ED Notes (Signed)
Pt comes into the ED via EMS from home, was found this morning in the living room face down by husband.. Reports pt has dementia, EMS reports pt has been snoring during transport only responding to painful stimuli.. On arrival pt is alert. Pt has healing wound above right eye from a fall 10 days ago that she was not seen for.

## 2015-05-31 NOTE — Discharge Instructions (Signed)
Fatigue Fatigue is feeling tired all of the time, a lack of energy, or a lack of motivation. Occasional or mild fatigue is often a normal response to activity or life in general. However, long-lasting (chronic) or extreme fatigue may indicate an underlying medical condition. HOME CARE INSTRUCTIONS  Watch your fatigue for any changes. The following actions may help to lessen any discomfort you are feeling:  Talk to your health care provider about how much sleep you need each night. Try to get the required amount every night.  Take medicines only as directed by your health care provider.  Eat a healthy and nutritious diet. Ask your health care provider if you need help changing your diet.  Drink enough fluid to keep your urine clear or pale yellow.  Practice ways of relaxing, such as yoga, meditation, massage therapy, or acupuncture.  Exercise regularly.   Change situations that cause you stress. Try to keep your work and personal routine reasonable.  Do not abuse illegal drugs.  Limit alcohol intake to no more than 1 drink per day for nonpregnant women and 2 drinks per day for men. One drink equals 12 ounces of beer, 5 ounces of wine, or 1 ounces of hard liquor.  Take a multivitamin, if directed by your health care provider. SEEK MEDICAL CARE IF:   Your fatigue does not get better.  You have a fever.   You have unintentional weight loss or gain.  You have headaches.   You have difficulty:   Falling asleep.  Sleeping throughout the night.  You feel angry, guilty, anxious, or sad.   You are unable to have a bowel movement (constipation).   You skin is dry.   Your legs or another part of your body is swollen.  SEEK IMMEDIATE MEDICAL CARE IF:   You feel confused.   Your vision is blurry.  You feel faint or pass out.   You have a severe headache.   You have severe abdominal, pelvic, or back pain.   You have chest pain, shortness of breath, or an  irregular or fast heartbeat.   You are unable to urinate or you urinate less than normal.   You develop abnormal bleeding, such as bleeding from the rectum, vagina, nose, lungs, or nipples.  You vomit blood.   You have thoughts about harming yourself or committing suicide.   You are worried that you might harm someone else.    This information is not intended to replace advice given to you by your health care provider. Make sure you discuss any questions you have with your health care provider.   Document Released: 12/29/2006 Document Revised: 03/24/2014 Document Reviewed: 07/05/2013 Elsevier Interactive Patient Education 2016 [place] Injury, Adult You have received a head injury. It does not appear serious at this time. Headaches and vomiting are common following head injury. It should be easy to awaken from sleeping. Sometimes it is necessary for you to stay in the emergency department for a while for observation. Sometimes admission to the hospital may be needed. After injuries such as yours, most problems occur within the first 24 hours, but side effects may occur up to 7-10 days after the injury. It is important for you to carefully monitor your condition and contact your health care provider or seek immediate medical care if there is a change in your condition. WHAT ARE THE TYPES OF HEAD INJURIES? Head injuries can be as minor as a bump. Some head injuries can be more severe.  More severe head injuries include:  A jarring injury to the brain (concussion).  A bruise of the brain (contusion). This mean there is bleeding in the brain that can cause swelling.  A cracked skull (skull fracture).  Bleeding in the brain that collects, clots, and forms a bump (hematoma). WHAT CAUSES A HEAD INJURY? A serious head injury is most likely to happen to someone who is in a car wreck and is not wearing a seat belt. Other causes of major head injuries include bicycle or motorcycle  accidents, sports injuries, and falls. HOW ARE HEAD INJURIES DIAGNOSED? A complete history of the event leading to the injury and your current symptoms will be helpful in diagnosing head injuries. Many times, pictures of the brain, such as CT or MRI are needed to see the extent of the injury. Often, an overnight hospital stay is necessary for observation.  WHEN SHOULD I SEEK IMMEDIATE MEDICAL CARE?  You should get help right away if:  You have confusion or drowsiness.  You feel sick to your stomach (nauseous) or have continued, forceful vomiting.  You have dizziness or unsteadiness that is getting worse.  You have severe, continued headaches not relieved by medicine. Only take over-the-counter or prescription medicines for pain, fever, or discomfort as directed by your health care provider.  You do not have normal function of the arms or legs or are unable to walk.  You notice changes in the black spots in the center of the colored part of your eye (pupil).  You have a clear or bloody fluid coming from your nose or ears.  You have a loss of vision. During the next 24 hours after the injury, you must stay with someone who can watch you for the warning signs. This person should contact local emergency services (911 in the [place] if you have seizures, you become unconscious, or you are unable to wake up. HOW CAN I PREVENT A HEAD INJURY IN THE FUTURE? The most important factor for preventing major head injuries is avoiding motor vehicle accidents. To minimize the potential for damage to your head, it is crucial to wear seat belts while riding in motor vehicles. Wearing helmets while bike riding and playing collision sports (like football) is also helpful. Also, avoiding dangerous activities around the house will further help reduce your risk of head injury.  WHEN CAN I RETURN TO NORMAL ACTIVITIES AND ATHLETICS? You should be reevaluated by your health care provider before returning to these  activities. If you have any of the following symptoms, you should not return to activities or contact sports until 1 week after the symptoms have stopped:  Persistent headache.  Dizziness or vertigo.  Poor attention and concentration.  Confusion.  Memory problems.  Nausea or vomiting.  Fatigue or tire easily.  Irritability.  Intolerant of bright lights or loud noises.  Anxiety or depression.  Disturbed sleep. MAKE SURE YOU:   Understand these instructions.  Will watch your condition.  Will get help right away if you are not doing well or get worse.   This information is not intended to replace advice given to you by your health care provider. Make sure you discuss any questions you have with your health care provider.   Document Released: 03/03/2005 Document Revised: 03/24/2014 Document Reviewed: 11/08/2012 Elsevier Interactive Patient Education [employer].

## 2015-10-17 ENCOUNTER — Inpatient Hospital Stay
Admission: EM | Admit: 2015-10-17 | Discharge: 2015-10-19 | DRG: 880 | Disposition: A | Discharge status: Nursing Home (Includes ICF) | Payer: Medicare Other | Attending: Internal Medicine | Admitting: Internal Medicine

## 2015-10-17 ENCOUNTER — Emergency Department: Payer: Medicare Other

## 2015-10-17 ENCOUNTER — Encounter: Payer: Self-pay | Admitting: Emergency Medicine

## 2015-10-17 DIAGNOSIS — Z885 Allergy status to narcotic agent status: Secondary | ICD-10-CM | POA: Diagnosis not present

## 2015-10-17 DIAGNOSIS — Z882 Allergy status to sulfonamides status: Secondary | ICD-10-CM

## 2015-10-17 DIAGNOSIS — G9340 Encephalopathy, unspecified: Secondary | ICD-10-CM | POA: Diagnosis present

## 2015-10-17 DIAGNOSIS — Z8249 Family history of ischemic heart disease and other diseases of the circulatory system: Secondary | ICD-10-CM | POA: Diagnosis not present

## 2015-10-17 DIAGNOSIS — F05 Delirium due to known physiological condition: Secondary | ICD-10-CM | POA: Diagnosis not present

## 2015-10-17 DIAGNOSIS — Z9181 History of falling: Secondary | ICD-10-CM

## 2015-10-17 DIAGNOSIS — E876 Hypokalemia: Secondary | ICD-10-CM | POA: Diagnosis present

## 2015-10-17 DIAGNOSIS — W07XXXA Fall from chair, initial encounter: Secondary | ICD-10-CM | POA: Diagnosis present

## 2015-10-17 DIAGNOSIS — I10 Essential (primary) hypertension: Secondary | ICD-10-CM | POA: Diagnosis present

## 2015-10-17 DIAGNOSIS — R059 Cough, unspecified: Secondary | ICD-10-CM

## 2015-10-17 DIAGNOSIS — N390 Urinary tract infection, site not specified: Secondary | ICD-10-CM

## 2015-10-17 DIAGNOSIS — L89152 Pressure ulcer of sacral region, stage 2: Secondary | ICD-10-CM | POA: Diagnosis present

## 2015-10-17 DIAGNOSIS — B9620 Unspecified Escherichia coli [E. coli] as the cause of diseases classified elsewhere: Secondary | ICD-10-CM | POA: Diagnosis present

## 2015-10-17 DIAGNOSIS — Z66 Do not resuscitate: Secondary | ICD-10-CM | POA: Diagnosis present

## 2015-10-17 DIAGNOSIS — R05 Cough: Secondary | ICD-10-CM

## 2015-10-17 DIAGNOSIS — R410 Disorientation, unspecified: Secondary | ICD-10-CM | POA: Diagnosis not present

## 2015-10-17 DIAGNOSIS — W19XXXA Unspecified fall, initial encounter: Secondary | ICD-10-CM

## 2015-10-17 DIAGNOSIS — L8990 Pressure ulcer of unspecified site, unspecified stage: Secondary | ICD-10-CM | POA: Insufficient documentation

## 2015-10-17 DIAGNOSIS — Y92009 Unspecified place in unspecified non-institutional (private) residence as the place of occurrence of the external cause: Secondary | ICD-10-CM

## 2015-10-17 DIAGNOSIS — N3090 Cystitis, unspecified without hematuria: Secondary | ICD-10-CM | POA: Diagnosis present

## 2015-10-17 HISTORY — DX: Malignant neoplasm of uterus, part unspecified: C55

## 2015-10-17 LAB — COMPREHENSIVE METABOLIC PANEL
ALBUMIN: 4 g/dL (ref 3.5–5.0)
ALK PHOS: 65 U/L (ref 38–126)
ALT: 15 U/L (ref 14–54)
AST: 31 U/L (ref 15–41)
Anion gap: 12 (ref 5–15)
BILIRUBIN TOTAL: 0.6 mg/dL (ref 0.3–1.2)
BUN: 21 mg/dL — AB (ref 6–20)
CALCIUM: 9.7 mg/dL (ref 8.9–10.3)
CO2: 26 mmol/L (ref 22–32)
Chloride: 104 mmol/L (ref 101–111)
Creatinine, Ser: 0.71 mg/dL (ref 0.44–1.00)
GFR calc Af Amer: >60 mL/min (ref >60)
GFR calc non Af Amer: >60 mL/min (ref >60)
GLUCOSE: 110 mg/dL — AB (ref 65–99)
Potassium: 3.7 mmol/L (ref 3.5–5.1)
SODIUM: 142 mmol/L (ref 135–145)
TOTAL PROTEIN: 7.3 g/dL (ref 6.5–8.1)

## 2015-10-17 LAB — URINALYSIS COMPLETE WITH MICROSCOPIC (ARMC ONLY)
Bilirubin Urine: NEGATIVE
GLUCOSE, UA: NEGATIVE mg/dL
Ketones, ur: NEGATIVE mg/dL
Nitrite: NEGATIVE
PROTEIN: NEGATIVE mg/dL
Specific Gravity, Urine: 1.019 (ref 1.005–1.030)
pH: 5 (ref 5.0–8.0)

## 2015-10-17 LAB — CBC WITH DIFFERENTIAL/PLATELET
BASOS ABS: 0 10*3/uL (ref 0–0.1)
BASOS PCT: 1 %
Eosinophils Absolute: 0.1 10*3/uL (ref 0–0.7)
Eosinophils Relative: 1 %
HEMATOCRIT: 37.3 % (ref 35.0–47.0)
HEMOGLOBIN: 12.9 g/dL (ref 12.0–16.0)
Lymphocytes Relative: 23 %
Lymphs Abs: 1.2 10*3/uL (ref 1.0–3.6)
MCH: 29.5 pg (ref 26.0–34.0)
MCHC: 34.7 g/dL (ref 32.0–36.0)
MCV: 85 fL (ref 80.0–100.0)
MONOS PCT: 6 %
Monocytes Absolute: 0.3 10*3/uL (ref 0.2–0.9)
NEUTROS ABS: 3.6 10*3/uL (ref 1.4–6.5)
Neutrophils Relative %: 69 %
Platelets: 171 10*3/uL (ref 150–440)
RBC: 4.39 MIL/uL (ref 3.80–5.20)
RDW: 14.5 % (ref 11.5–14.5)
WBC: 5.2 10*3/uL (ref 3.6–11.0)

## 2015-10-17 LAB — TROPONIN I: Troponin I: <0.03 ng/mL (ref <0.03)

## 2015-10-17 LAB — LACTIC ACID, PLASMA
LACTIC ACID, VENOUS: 1.5 mmol/L (ref 0.5–1.9)
LACTIC ACID, VENOUS: 1.1 mmol/L (ref 0.5–1.9)

## 2015-10-17 MED ORDER — ZIPRASIDONE MESYLATE 20 MG IM SOLR
10.0000 mg | Freq: Two times a day (BID) | INTRAMUSCULAR | Status: DC | PRN
  Filled 2015-10-17: qty 20

## 2015-10-17 MED ORDER — QUETIAPINE FUMARATE 25 MG PO TABS
25.0000 mg | ORAL_TABLET | Freq: Every day | ORAL | Status: DC
  Administered 2015-10-17 – 2015-10-18 (×2): 25 mg via ORAL
  Filled 2015-10-17 (×2): qty 1

## 2015-10-17 MED ORDER — DOCUSATE SODIUM 100 MG PO CAPS
100.0000 mg | ORAL_CAPSULE | Freq: Every day | ORAL | Status: DC
  Administered 2015-10-17 – 2015-10-18 (×2): 100 mg via ORAL
  Filled 2015-10-17 (×2): qty 1

## 2015-10-17 MED ORDER — LISINOPRIL-HYDROCHLOROTHIAZIDE 10-12.5 MG PO TABS: 1.0000 | ORAL_TABLET | Freq: Every day | ORAL | Status: DC

## 2015-10-17 MED ORDER — NYSTATIN 100000 UNIT/GM EX POWD
Freq: Three times a day (TID) | CUTANEOUS | Status: DC
  Administered 2015-10-17 – 2015-10-19 (×5): via TOPICAL
  Filled 2015-10-17: qty 15

## 2015-10-17 MED ORDER — ACETAMINOPHEN 650 MG RE SUPP: 650.0000 mg | Freq: Four times a day (QID) | RECTAL | Status: DC | PRN

## 2015-10-17 MED ORDER — ENOXAPARIN SODIUM 40 MG/0.4ML ~~LOC~~ SOLN
40.0000 mg | SUBCUTANEOUS | Status: DC
  Administered 2015-10-18: 18:00:00 40 mg via SUBCUTANEOUS
  Filled 2015-10-17: qty 0.4

## 2015-10-17 MED ORDER — DEXTROSE 5 % IV SOLN
1.0000 g | Freq: Once | INTRAVENOUS | Status: AC
  Administered 2015-10-17: 1 g via INTRAVENOUS
  Filled 2015-10-17: qty 10

## 2015-10-17 MED ORDER — LORAZEPAM 2 MG/ML IJ SOLN
INTRAMUSCULAR | Status: AC
  Administered 2015-10-17: 0.5 mg via INTRAVENOUS
  Filled 2015-10-17: qty 1

## 2015-10-17 MED ORDER — POLYETHYLENE GLYCOL 3350 17 G PO PACK
17.0000 g | PACK | Freq: Every day | ORAL | Status: DC
  Administered 2015-10-19: 17 g via ORAL
  Filled 2015-10-17 (×2): qty 1

## 2015-10-17 MED ORDER — LORAZEPAM 2 MG/ML IJ SOLN
0.5000 mg | Freq: Once | INTRAMUSCULAR | Status: AC
  Administered 2015-10-17: 0.5 mg via INTRAVENOUS

## 2015-10-17 MED ORDER — LISINOPRIL 10 MG PO TABS
10.0000 mg | ORAL_TABLET | Freq: Every day | ORAL | Status: DC
  Administered 2015-10-17 – 2015-10-19 (×3): 10 mg via ORAL
  Filled 2015-10-17 (×3): qty 1

## 2015-10-17 MED ORDER — HYDROCHLOROTHIAZIDE 12.5 MG PO CAPS
12.5000 mg | ORAL_CAPSULE | Freq: Every day | ORAL | Status: DC
  Administered 2015-10-17 – 2015-10-19 (×3): 12.5 mg via ORAL
  Filled 2015-10-17 (×3): qty 1

## 2015-10-17 MED ORDER — ACETAMINOPHEN 325 MG PO TABS: 650.0000 mg | ORAL_TABLET | Freq: Four times a day (QID) | ORAL | Status: DC | PRN

## 2015-10-17 MED ORDER — DEXTROSE 5 % IV SOLN
1.0000 g | INTRAVENOUS | Status: DC
  Administered 2015-10-18 – 2015-10-19 (×2): 1 g via INTRAVENOUS
  Filled 2015-10-17 (×2): qty 10

## 2015-10-17 NOTE — H&P (Signed)
[facility] NAME: Melinda Hughes    MR#:  [id number]  DATE OF BIRTH:  [birth year]  DATE OF ADMISSION:  10/17/2015  PRIMARY CARE PHYSICIAN: Kirk Ruths., MD   REQUESTING/REFERRING PHYSICIAN: Dr Evern Bio  CHIEF COMPLAINT:   Chief Complaint  Patient presents with  . Fall    HISTORY OF PRESENT ILLNESS:  Melinda Hughes  is a 82 y.o. female with a known history of Dementia and unable to give any history. Family able to give some history in the history obtained from old chart. Patient's been falling a lot at home. She slipped out of the chair and fell down and landed on her hip. She has been very disoriented. She is been worse then usual. In the ER she was found to have a urinary tract infection. Patient does have a plastic baby that she was talking to more than me was in the room.  PAST MEDICAL HISTORY:   Past Medical History:  Diagnosis Date  . Arthritis   . Cancer determined by uterine cervix biopsy ([place])   . Dementia   . Diabetes mellitus without complication ([place])   . Hypertension     PAST SURGICAL HISTORY:   Past Surgical History:  Procedure Laterality Date  . ABDOMINAL HYSTERECTOMY    . EYE SURGERY    . FRACTURE SURGERY      SOCIAL HISTORY:   Social History  Substance Use Topics  . Smoking status: Never Smoker  . Smokeless tobacco: Never Used  . Alcohol use No    FAMILY HISTORY:   Family History  Problem Relation Age of Onset  . Hypertension Father     DRUG ALLERGIES:   Allergies  Allergen Reactions  . Codeine Itching  . Morphine And Related Itching  . Sulfa Antibiotics Itching    REVIEW OF SYSTEMS:  Unable to perform review of systems secondary to dementia and worsening mental status.  MEDICATIONS AT HOME:   Prior to Admission medications   Medication Sig Start Date End Date Taking? Authorizing Provider  docusate sodium (COLACE) 100 MG capsule Take 100 mg by mouth daily.   Yes  Historical Provider, MD  lisinopril-hydrochlorothiazide (PRINZIDE,ZESTORETIC) 10-12.5 MG tablet Take 1 tablet by mouth daily.   Yes Historical Provider, MD  polyethylene glycol (MIRALAX / GLYCOLAX) packet Take 17 g by mouth daily.   Yes Historical Provider, MD   Medication reconciliation process still undergoing.  VITAL SIGNS:  Blood pressure (!) 151/91, pulse 68, temperature 98.5 F (36.9 C), temperature source Rectal, resp. rate 13, height 5\' 3"  (1.6 m), weight 75.2 kg (165 lb 12.8 oz), SpO2 99 %.  PHYSICAL EXAMINATION:  GENERAL:  82 y.o.-year-old patient lying in the bed with no acute distress.  EYES: Pupils equal, round, reactive to light and accommodation. No scleral icterus. HEENT: Head atraumatic, normocephalic. Oropharynx and nasopharynx clear.  NECK:  Supple, no jugular venous distention. No thyroid enlargement, no tenderness.  LUNGS: Normal breath sounds bilaterally, no wheezing, rales,rhonchi or crepitation. No use of accessory muscles of respiration.  CARDIOVASCULAR: S1, S2 normal. No murmurs, rubs, or gallops.  ABDOMEN: Soft, nontender, nondistended. Bowel sounds present. No organomegaly or mass.  EXTREMITIES: 2+ edema. No cyanosis, or clubbing.  NEUROLOGIC: Patient seen moving her arms and legs on her own. Gait not checked.  PSYCHIATRIC: The patient is alert. Does not answer my questions. SKIN: Bilateral groin erythema worse on the right.   LABORATORY PANEL:   CBC  Recent Labs Lab 10/17/15  1554  WBC 5.2  HGB 12.9  HCT 37.3  PLT 171   ------------------------------------------------------------------------------------------------------------------  Chemistries   Recent Labs Lab 10/17/15 1554  NA 142  K 3.7  CL 104  CO2 26  GLUCOSE 110*  BUN 21*  CREATININE 0.71  CALCIUM 9.7  AST 31  ALT 15  ALKPHOS 65  BILITOT 0.6   ------------------------------------------------------------------------------------------------------------------  Cardiac  Enzymes  Recent Labs Lab 10/17/15 1554  TROPONINI <0.03   ------------------------------------------------------------------------------------------------------------------  RADIOLOGY:  Ct Head Wo Contrast  Result Date: 10/17/2015 CLINICAL DATA:  Altered mental status, unwitnessed fall EXAM: CT HEAD WITHOUT CONTRAST TECHNIQUE: Contiguous axial images were obtained from the base of the skull through the vertex without intravenous contrast. COMPARISON:  05/31/2015 FINDINGS: No evidence of parenchymal hemorrhage or extra-axial fluid collection. No mass lesion, mass effect, or midline shift. No CT evidence of acute infarction. Subcortical white matter and periventricular small vessel ischemic changes. Atrophy, most prominent in the bilateral medial temporal lobes. No ventriculomegaly. The visualized paranasal sinuses are essentially clear. The mastoid air cells are unopacified. No evidence of calvarial fracture. IMPRESSION: No evidence of acute intracranial abnormality. Atrophy with small vessel ischemic changes. Electronically Signed   By: Julian Hy M.D.   On: 10/17/2015 16:55   Dg Hip Unilat W Or Wo Pelvis 2-3 Views Left  Result Date: 10/17/2015 CLINICAL DATA:  Unwitnessed fall, found on floor at home soiled with urine and feces, disorientation, altered mental status, history dementia, hypertension, diabetes mellitus EXAM: DG HIP (WITH OR WITHOUT PELVIS) 2-3V LEFT COMPARISON:  None FINDINGS: Diffuse osseous demineralization. LEFT hip prosthesis. Degenerative changes of RIGHT hip joint. SI joints symmetric and preserved. No acute fracture, dislocation, or bone destruction. Degenerative disc disease changes at visualized portion of lower lumbar spine. IMPRESSION: Osseous demineralization with LEFT hip prosthesis and degenerative changes of the lower lumbar spine. No definite acute bony abnormalities. Electronically Signed   By: Lavonia Dana M.D.   On: 10/17/2015 16:51    EKG:   Normal sinus  rhythm 80 bpm with left anterior fascicular block.  IMPRESSION AND PLAN:   1. Acute encephalopathy with underlying dementia. Likely this is acute delirium secondary to acute cystitis. Give Seroquel at night. In the ER she received an IV dose of Ativan. 2. Acute cystitis with hematuria. IV Rocephin and follow up urine culture. 3. Weakness and frequent falls. Physical therapy consultation. Social worker for placement. 4. Essential hypertension. Once med rec is then order her usual medication. 5. History of diabetes in the past but this has resolved after tremendous weight loss.  All the records are reviewed and case discussed with ED provider. Management plans discussed with the patient, family and they are in agreement.  CODE STATUS: DO NOT RESUSCITATE  TOTAL TIME TAKING CARE OF THIS PATIENT: 50 minutes.    Loletha Grayer M.D on 10/17/2015 at 6:10 PM  Between 7am to 6pm - Pager - [phone]  After 6pm call admission pager [phone]  [facility]  [phone]  CC: Primary care physician; Kirk Ruths., MD

## 2015-10-17 NOTE — ED Triage Notes (Signed)
Per [facility], patient comes from home. Patient had an unwitnessed fall. Unsure if her head was hit. EMS found patient on the floor soiled in urine and feces. EMS had to use a scoop stretcher to get patient off the floor. Patient has advanced dementia. Disoriented x4. Screaming, "Daddy you come in here and kill them". Soiled clothing changed. Clean brief applied. VSS.

## 2015-10-17 NOTE — ED Notes (Signed)
Attempted to call report x 1  

## 2015-10-17 NOTE — ED Notes (Signed)
Patient attempting to pull out IV. Patient ripped off all cardiac leads. Patient reoriented. Soiled brief and linen changed.

## 2015-10-17 NOTE — ED Notes (Signed)
Soiled brief and linen changed at this time. 

## 2015-10-17 NOTE — ED Notes (Signed)
Attempted to call report x2

## 2015-10-17 NOTE — ED Provider Notes (Signed)
[facility] Emergency Department Provider Note    ____________________________________________   I have reviewed the triage vital signs and the nursing notes.   HISTORY  Chief Complaint Fall   History limited by: dementia, history obtained from husband   HPI Melinda Hughes is a 82 y.o. female with history of dementia who comes from home today after a fall. Per husband patient at baseline can normally say her name and birthday. He has noticed for the past couple of weeks she seems to be slipping out of chairs more frequently. Today the husband was in a different room when he heard a thud. When he came into the room he found the patient lying on her right hip. He does have concern that she might of hit her head and injured her left hip, which she had replaced a couple of years ago. Family is working on trying to get the patient to a nursing facility.    Past Medical History:  Diagnosis Date  . Arthritis   . Cancer determined by uterine cervix biopsy ([place])   . Dementia   . Diabetes mellitus without complication ([place])   . Hypertension     There are no active problems to display for this patient.   Past Surgical History:  Procedure Laterality Date  . ABDOMINAL HYSTERECTOMY    . EYE SURGERY    . FRACTURE SURGERY      Prior to Admission medications   Not on File    Allergies Codeine; Morphine and related; and Sulfa antibiotics  No family history on file.  Social History Social History  Substance Use Topics  . Smoking status: Never Smoker  . Smokeless tobacco: Not on file  . Alcohol use No    Review of Systems Unable to obtain secondary to dementia.  ____________________________________________   PHYSICAL EXAM:  VITAL SIGNS: ED Triage Vitals  Enc Vitals Group     BP 10/17/15 1534 (!) 120/108     Pulse Rate 10/17/15 1534 80     Resp 10/17/15 1534 (!) 21     Temp 10/17/15 1542 98.5 F (36.9 C)     Temp Source 10/17/15 1542 Rectal      SpO2 10/17/15 1534 95 %     Weight 10/17/15 1534 165 lb 12.8 oz (75.2 kg)     Height 10/17/15 1534 5\' 3"  (1.6 m)     Head Circumference --    Constitutional: Awake and alert, not oriented to name, birthday or event. Eyes: Conjunctivae are normal. PERRL. Normal extraocular movements. ENT   Head: Normocephalic and atraumatic.   Nose: No congestion/rhinnorhea.   Mouth/Throat: Mucous membranes are moist.   Neck: No stridor. Hematological/Lymphatic/Immunilogical: No cervical lymphadenopathy. Cardiovascular: Normal rate, regular rhythm.  No murmurs, rubs, or gallops. Respiratory: Normal respiratory effort without tachypnea nor retractions. Breath sounds are clear and equal bilaterally. No wheezes/rales/rhonchi. Gastrointestinal: Soft and nontender. No distention.  Genitourinary: Deferred Musculoskeletal: Normal range of motion in all extremities. No joint effusions.  No lower extremity tenderness nor edema. Neurologic:  Awake and alert, not oriented. Appears to move all extremities. Skin:  Skin is warm, dry. Rash consistent with candidal infection present in many areas including groin and under breast.  Psychiatric: Mood and affect are normal. Speech and behavior are normal. Patient exhibits appropriate insight and judgment.  ____________________________________________    LABS (pertinent positives/negatives)  Labs Reviewed  COMPREHENSIVE METABOLIC PANEL - Abnormal; Notable for the following:       Result Value   Glucose, Bld  110 (*)    BUN 21 (*)    All other components within normal limits  URINALYSIS COMPLETEWITH MICROSCOPIC ([facility] ONLY) - Abnormal; Notable for the following:    Color, Urine YELLOW (*)    APPearance CLOUDY (*)    Hgb urine dipstick 1+ (*)    Leukocytes, UA 3+ (*)    Bacteria, UA FEW (*)    Squamous Epithelial / LPF 0-5 (*)    All other components within normal limits  URINE CULTURE  CBC WITH DIFFERENTIAL/PLATELET  TROPONIN I  LACTIC ACID, PLASMA   LACTIC ACID, PLASMA     ____________________________________________   EKG  I, Nance Pear, attending physician, personally viewed and interpreted this EKG  EKG Time: 1533 Rate: 80 Rhythm: sinus rhythm Axis: left axis deviation Intervals: qtc 477 QRS: LAFB ST changes: no st elevation Impression: abnormal ekg   ____________________________________________    RADIOLOGY  CT head IMPRESSION: No evidence of acute intracranial abnormality.  Atrophy with small vessel ischemic changes.   Left hip IMPRESSION: Osseous demineralization with LEFT hip prosthesis and degenerative changes of the lower lumbar spine.  No definite acute bony abnormalities.   ____________________________________________   PROCEDURES  Procedures  ____________________________________________   INITIAL IMPRESSION / ASSESSMENT AND PLAN / ED COURSE  Pertinent labs & imaging results that were available during my care of the patient were reviewed by me and considered in my medical decision making (see chart for details).  She with history of dementia who presents to the emergency department today after a fall. Concern for head injury and left hip injury given pain. Additionally considered for infection causing increased weakness over the past couple weeks.  Clinical Course   Patient's urine did return positive for urinary tract infection. Imaging negative for any acute fractures. Given  patient baseline dementia and urinary tract infection. Will plan on admission to the hospital service for further workup and management. ____________________________________________   FINAL CLINICAL IMPRESSION(S) / ED DIAGNOSES  Final diagnoses:  Fall, initial encounter  UTI (lower urinary tract infection)     Note: This dictation was prepared with Dragon dictation. Any transcriptional errors that result from this process are unintentional    Nance Pear, MD 10/17/15 1732

## 2015-10-17 NOTE — ED Notes (Signed)
Patient is resting much more comfortably. Family at bedside. No further needs at this time. Will continue to monitor.

## 2015-10-17 NOTE — ED Notes (Signed)
Attempted to call report x3.Charge nurse notified.

## 2015-10-18 ENCOUNTER — Inpatient Hospital Stay: Payer: Medicare Other

## 2015-10-18 DIAGNOSIS — L8990 Pressure ulcer of unspecified site, unspecified stage: Secondary | ICD-10-CM | POA: Insufficient documentation

## 2015-10-18 LAB — BASIC METABOLIC PANEL
ANION GAP: 5 (ref 5–15)
BUN: 18 mg/dL (ref 6–20)
CALCIUM: 9.1 mg/dL (ref 8.9–10.3)
CO2: 29 mmol/L (ref 22–32)
CREATININE: 0.76 mg/dL (ref 0.44–1.00)
Chloride: 105 mmol/L (ref 101–111)
Glucose, Bld: 93 mg/dL (ref 65–99)
Potassium: 3.2 mmol/L — ABNORMAL LOW (ref 3.5–5.1)
SODIUM: 139 mmol/L (ref 135–145)

## 2015-10-18 LAB — CBC
HCT: 37 % (ref 35.0–47.0)
HEMOGLOBIN: 12.8 g/dL (ref 12.0–16.0)
MCH: 29.4 pg (ref 26.0–34.0)
MCHC: 34.6 g/dL (ref 32.0–36.0)
MCV: 85 fL (ref 80.0–100.0)
PLATELETS: 171 10*3/uL (ref 150–440)
RBC: 4.35 MIL/uL (ref 3.80–5.20)
RDW: 14.6 % — ABNORMAL HIGH (ref 11.5–14.5)
WBC: 5 10*3/uL (ref 3.6–11.0)

## 2015-10-18 MED ORDER — POTASSIUM CHLORIDE CRYS ER 20 MEQ PO TBCR
40.0000 meq | EXTENDED_RELEASE_TABLET | Freq: Once | ORAL | Status: AC
  Administered 2015-10-18: 16:00:00 40 meq via ORAL
  Filled 2015-10-18 (×2): qty 2

## 2015-10-18 NOTE — Clinical Social Work Note (Addendum)
Clinical Social Work Assessment  Patient Details  Name: Melinda Hughes MRN: [id number] Date of Birth: Dec 09, 1931  Date of referral:  10/18/15               Reason for consult:  Facility Placement                Permission sought to share information with:  Family Supports Permission granted to share information::   (Patient has dementia. The husband is the primary POA.)  Name::     Melinda Hughes [phone]  Agency::     Relationship::     Contact Information:     Housing/Transportation Living arrangements for the past 2 months:  Single Family Home Source of Information:  Spouse Patient Interpreter Needed:    Criminal Activity/Legal Involvement Pertinent to Current Situation/Hospitalization:  No - Comment as needed Significant Relationships:  Spouse, Adult Children Lives with:  Spouse Do you feel safe going back to the place where you live?  No Need for family participation in patient care:  Yes (Comment)  Care giving concerns:  Patient's spouse is no longer able to safely care for the patient's needs in the home due to his age and own caregiver needs.   Social Worker assessment / plan:  Patient has dementia and is unable to give a history. CSW telephoned patient's spouse for collateral information. The patient's spouse was pleasant and involved in his wife's care.  According to the patient's husband, the patient had a 6 week stay at [facility] Unit post-fall April 9. Patient d/c'd from [place] to home in her husband's care. In the past two months, patient's dementia related behaviors have escalated to the point that Mr. Orrick does not feel safe providing care in the home. Patient is not wandering, but she is getting out of bed at night and often only speaking to a plastic babydoll. Patient's husband indicates that patient is not aggressive in any way.  Patient's family requested return to [facility] and proactively began the process. Ramond Marrow at [facility] requested a chest  x-ray ruling out TB and typical referral information, and she has offered a bed to Melinda Hughes.  CSW will remain available to the family for any questions and for d/c planning.  Employment status:  Retired Forensic scientist:  Medicare PT Recommendations:  Not assessed at this time Information / Referral to community resources: ALF  Patient/Family's Response to care:  Patient unable to respond/Patient's family is proactive and realistic about goals of care.  Patient/Family's Understanding of and Emotional Response to Diagnosis, Current Treatment, and Prognosis: Patient's spouse is able to explain in his own terms his wife's disposition and care needs. He was appreciative of CSW emotional support and care.  Emotional Assessment Appearance:  Appears stated age Attitude/Demeanor/Rapport:   (Patient's spouse was appropriate and pleasant) Affect (typically observed):   (Telephone contact.) Orientation:   (Patient has dementia) Alcohol / Substance use:  Never Used Psych involvement (Current and /or in the community):  No (Comment)  Discharge Needs  Concerns to be addressed:  Cognitive Concerns Readmission within the last 30 days:  No Current discharge risk:  Cognitively Impaired (Patient has dementia.) Barriers to Discharge:   None   Melinda Pho, LCSW 10/18/2015, 3:22 PM

## 2015-10-18 NOTE — Clinical Social Work Note (Signed)
CSW spoke with Melinda Hughes at [facility] and she stated that they would have to come and evaluate patient again for admission consideration as her level of care changed from being at home to being in the hospital. PT has assessed patient and stated patient has no skilled needs. Shela Leff MSW,LCSW [phone]

## 2015-10-18 NOTE — Progress Notes (Signed)
Physical Therapy Evaluation Patient Details Name: Melinda Hughes MRN: [id number] DOB: 1931/10/27 Today's Date: 10/18/2015   History of Present Illness  Pt is an 82 y/o female who is admitted with acute encephalopathy. Per MD notes, pt had an unwitnessed fall at home and has been falling frequently at home. Pt has dementia at baseline, per notes her husband states this is worse than her baseline. Pt has been combative during her hospital stay. PMh includes recurrent UTIs, HTN, and DM.  Clinical Impression  Pt is an 82 y/o female who presents with altered mental status and a history of dementia at baseline. PLOF: Per pt notes, pt lived at home with husband who assists with bathing and dressing. Pt ambulates with no AD. Pt has a baby doll with her and believes it is real, speaking to it incomprehensibly. Pt requires min assist with attempted bed mobility. Pt able to scoot B LE close to EOB but at last minute decides she wants to lay in bed. Pt does not follow simple, one-step commands. Pt is not appropriate for physical therapy due to cognitive impairments and inability to follow commands. Will discharge current order, please re consult if necessary. At this time, pt is most appropriate for a memory care facility where she would have 24 hour supervision and assistance.     Follow Up Recommendations Supervision/Assistance - 24 hour (Memory Care)    Equipment Recommendations       Recommendations for Other Services       Precautions / Restrictions Precautions Precautions: Fall Restrictions Weight Bearing Restrictions: No      Mobility  Bed Mobility Overal bed mobility: Needs Assistance Bed Mobility: Supine to Sit     Supine to sit: Min assist     General bed mobility comments: Pt requires verbal cueing to attempt sitting on EOB. Pt able to move LE close to EOB but at last minute decides she does not want to sit up.   Transfers                 General transfer comment: Not attempted  due to impaired cognition  Ambulation/Gait             General Gait Details: Not attempted due to impaired cognition  Stairs            Wheelchair Mobility    Modified Rankin (Stroke Patients Only)       Balance                                             Pertinent Vitals/Pain Pain Assessment: Faces Faces Pain Scale: Hurts a little bit    Home Living Family/patient expects to be discharged to:: Assisted living Passenger transport manager ([place])) Living Arrangements: Spouse/significant other             Home Equipment: Walker - 2 wheels (per pt notes) Additional Comments: Pt is a poor historian due to dementia.    Prior Function Level of Independence: Needs assistance   Gait / Transfers Assistance Needed: Pt ambulates without AD  ADL's / Homemaking Assistance Needed: Husband assists with dressing and baths  Comments: Per pt notes in chart     Hand Dominance        Extremity/Trunk Assessment   Upper Extremity Assessment: Difficult to assess due to impaired cognition (UE at least 3/5)  Lower Extremity Assessment: Difficult to assess due to impaired cognition (LE at least 3/5)         Communication   Communication: No difficulties;Other (comment) (Altered mental status)  Cognition Arousal/Alertness: Awake/alert Behavior During Therapy: Agitated Overall Cognitive Status: History of cognitive impairments - at baseline                      General Comments      Exercises        Assessment/Plan    PT Assessment [place] does not need any further PT services  PT Diagnosis Difficulty walking;Altered mental status   PT Problem List    PT Treatment Interventions     PT Goals (Current goals can be found in the Care Plan section) Acute Rehab PT Goals Patient Stated Goal: N/A PT Goal Formulation: Patient unable to participate in goal setting Time For Goal Achievement: 11/01/15 Potential to Achieve  Goals: Fair    Frequency     Barriers to discharge        Co-evaluation               End of Session   Activity Tolerance: Other (comment) (pt limited by altered mental status) Patient left: in bed;with call bell/phone within reach;with bed alarm set Nurse Communication: Mobility status;Other (comment) (RN notified that pt had voided in bed)         Time: 1450-1509 PT Time Calculation (min) (ACUTE ONLY): 19 min   Charges:         PT G Codes:        Georgina Pillion 11-14-15, 3:35 PM  Georgina Pillion, SPT [phone]

## 2015-10-18 NOTE — Progress Notes (Signed)
[place] [facility] [place] NAME: Marveline Klostermann    MR#:  [id number]  DATE OF BIRTH:  28-Aug-1931  SUBJECTIVE:  CHIEF COMPLAINT:   Chief Complaint  Patient presents with  . Fall   - severe dementia, lying in bed, confused - no agitation today  REVIEW OF SYSTEMS:  Review of Systems  Unable to perform ROS: Dementia    DRUG ALLERGIES:   Allergies  Allergen Reactions  . Codeine Itching  . Morphine And Related Itching  . Sulfa Antibiotics Itching    VITALS:  Blood pressure 110/84, pulse (!) 51, temperature 97.5 F (36.4 C), temperature source Oral, resp. rate 18, height 5\' 3"  (1.6 m), weight 71.9 kg (158 lb 9.6 oz), SpO2 97 %.  PHYSICAL EXAMINATION:  Physical Exam  GENERAL:  82 y.o.-year-old patient lying in the bed with no acute distress. Holding her baby doll. EYES: Pupils equal, round, reactive to light and accommodation. No scleral icterus. Extraocular muscles intact.  HEENT: Head atraumatic, normocephalic. Oropharynx and nasopharynx clear.  NECK:  Supple, no jugular venous distention. No thyroid enlargement, no tenderness.  LUNGS: Normal breath sounds bilaterally, no wheezing, rales,rhonchi or crepitation. No use of accessory muscles of respiration. Decreased bibasilar breath sounds. CARDIOVASCULAR: S1, S2 normal. No murmurs, rubs, or gallops.  ABDOMEN: Soft, nontender, nondistended. Bowel sounds present. No organomegaly or mass.  EXTREMITIES: No cyanosis, or clubbing. 1+ ankle edema NEUROLOGIC: no facial assymmetry, not cooperating for neuro exam. Able to move all extremities. PSYCHIATRIC: The patient is alert and confused- dementia SKIN: No obvious rash, lesion, or ulcer.    LABORATORY PANEL:   CBC  Recent Labs Lab 10/18/15 0541  WBC 5.0  HGB 12.8  HCT 37.0  PLT 171   ------------------------------------------------------------------------------------------------------------------  Chemistries   Recent Labs Lab  10/17/15 1554 10/18/15 0541  NA 142 139  K 3.7 3.2*  CL 104 105  CO2 26 29  GLUCOSE 110* 93  BUN 21* 18  CREATININE 0.71 0.76  CALCIUM 9.7 9.1  AST 31  --   ALT 15  --   ALKPHOS 65  --   BILITOT 0.6  --    ------------------------------------------------------------------------------------------------------------------  Cardiac Enzymes  Recent Labs Lab 10/17/15 1554  TROPONINI <0.03   ------------------------------------------------------------------------------------------------------------------  RADIOLOGY:  Ct Head Wo Contrast  Result Date: 10/17/2015 CLINICAL DATA:  Altered mental status, unwitnessed fall EXAM: CT HEAD WITHOUT CONTRAST TECHNIQUE: Contiguous axial images were obtained from the base of the skull through the vertex without intravenous contrast. COMPARISON:  05/31/2015 FINDINGS: No evidence of parenchymal hemorrhage or extra-axial fluid collection. No mass lesion, mass effect, or midline shift. No CT evidence of acute infarction. Subcortical white matter and periventricular small vessel ischemic changes. Atrophy, most prominent in the bilateral medial temporal lobes. No ventriculomegaly. The visualized paranasal sinuses are essentially clear. The mastoid air cells are unopacified. No evidence of calvarial fracture. IMPRESSION: No evidence of acute intracranial abnormality. Atrophy with small vessel ischemic changes. Electronically Signed   By: Julian Hy M.D.   On: 10/17/2015 16:55   Dg Hip Unilat W Or Wo Pelvis 2-3 Views Left  Result Date: 10/17/2015 CLINICAL DATA:  Unwitnessed fall, found on floor at home soiled with urine and feces, disorientation, altered mental status, history dementia, hypertension, diabetes mellitus EXAM: DG HIP (WITH OR WITHOUT PELVIS) 2-3V LEFT COMPARISON:  None FINDINGS: Diffuse osseous demineralization. LEFT hip prosthesis. Degenerative changes of RIGHT hip joint. SI joints symmetric and preserved. No acute fracture, dislocation, or  bone destruction. Degenerative disc disease changes at visualized portion of lower lumbar spine. IMPRESSION: Osseous demineralization with LEFT hip prosthesis and degenerative changes of the lower lumbar spine. No definite acute bony abnormalities. Electronically Signed   By: Lavonia Dana M.D.   On: 10/17/2015 16:51    EKG:   Orders placed or performed in visit on 04/14/13  . EKG 12-Lead    ASSESSMENT AND PLAN:   82y/o F with PMH of severe dementia, DM, HTN admitted for worsened confusion and noted to have UTI.  #1 Acute encephalopathy- delirium on dementia- has periods of agitation - agree with seroquel and prn geodon - CT head negative for any acute findings  #2 Acute cystitis- f/u urine cultures - on rocephin  #3 HTN- on lisinopril, HCTZ  #4 Hypokalemia- being replaced  #5 Weakness, Falls- physical Therapy consult and possibly discharge to ALF tomorrow  #6 DVT Prophylaxis- lovenox    All the records are reviewed and case discussed with Care Management/Social Workerr. Management plans discussed with the patient, family and they are in agreement.  CODE STATUS: DNR  TOTAL TIME TAKING CARE OF THIS PATIENT: 37 minutes.   POSSIBLE D/C TOMORROW, DEPENDING ON CLINICAL CONDITION.   Alka Falwell M.D on 10/18/2015 at 12:29 PM  Between 7am to 6pm - Pager - [phone]  After 6pm go to [email or web address] - password [facility]  Office  [phone]  CC: Primary care physician; Kirk Ruths., MD

## 2015-10-18 NOTE — Care Management (Signed)
Admitted to [facility] with the diagnosis of acute encephalopathy  (UTI). Lives with husband, [name], [phone]). Daughter is Graceann Congress [phone] or [phone]).  Discharged from [facility] May 31st 2017 to home. A resident at [facility] weeks. Spoke with husband. States he has spoke with Nanine Means about taking his wife back into the Memory Care unit. Would like her to go from the hospital to [facility], if possible. Will need TB test.  States she hasn't seen Dr. Ouida Sills for a long time due to her dementia and management issues. No home health in the past. No skilled facility. No home oxygen. Walks without the use of any aids. Rolling walker in the home, if needed.  Husband helps with dressing and baths. Fell at home prior to this admission. Prescriptions are filled at [facility] in [place] MSN [place] Management [phone]

## 2015-10-18 NOTE — Plan of Care (Addendum)
Problem: Education: Goal: Knowledge of [place] [facility] Education information/materials will improve Outcome: Not Progressing Pt disoriented x4. Advanced dementia.  Problem: Skin Integrity: Goal: Risk for impaired skin integrity will decrease Outcome: Progressing Repositioned pt qx2hrs. Sacral dressing in place.  Problem: Nutrition: Goal: Adequate nutrition will be maintained Outcome: Not Progressing Poor intake. Pt encouraged to eat and drink. Pt need assistance with meals. Pt likes vanilla ice cream.

## 2015-10-18 NOTE — Care Management Important Message (Signed)
Important Message  Patient Details  Name: Chasitie Ritchie MRN: [id number] Date of Birth: [birth year]   Medicare Important Message Given:  Yes    Shelbie Ammons, RN 10/18/2015, 8:50 AM

## 2015-10-18 NOTE — NC FL2 (Signed)
  [place] LEVEL OF CARE SCREENING TOOL     IDENTIFICATION  Patient Name: Melinda Hughes Birthdate: 1932-02-21 Sex: female Admission Date (Current Location): 10/17/2015  [place] and [place] Number:  Engineering geologist and Address:  [facility], [address] [place], [place] [address]      Provider Number: [id number]  Attending Physician Name and Address:  Gladstone Lighter, MD  Relative Name and Phone Number:       Current Level of Care: Hospital Recommended Level of Care: [place] Prior Approval Number:    Date Approved/Denied: 10/18/15 PASRR Number: [id number]  Discharge Plan: Domiciliary (Rest home)-Memory Care    Current Diagnoses: Primary Diagnosis: Dementia   Patient Active Problem List   Diagnosis Date Noted  . Pressure ulcer 10/18/2015  . Acute encephalopathy 10/17/2015    Orientation RESPIRATION BLADDER Height & Weight    Disoriented X4    Normal Continent Weight: 158 lb 9.6 oz (71.9 kg) Height:  5\' 3"  (160 cm)  BEHAVIORAL SYMPTOMS/MOOD NEUROLOGICAL BOWEL NUTRITION STATUS   None   Continent    AMBULATORY STATUS COMMUNICATION OF NEEDS Skin   Independent Verbally Bruising (Bruising from recent fall)                       Personal Care Assistance Level of Assistance  Bathing, Dressing Bathing Assistance: Limited assistance Feeding assistance: Independent Dressing Assistance: Limited assistance     Functional Limitations Info             SPECIAL CARE FACTORS FREQUENCY                       Contractures Contractures Info: Present    Additional Factors Info  Code Status (DNR) Code Status Info: DNR              Discharge Medications: Medication List    TAKE these medications   acetaminophen 325 MG tablet Commonly known as:  TYLENOL Take 2 tablets (650 mg total) by mouth every 6 (six) hours as needed for mild pain, fever or headache (or Fever >/= 100.5).   bisacodyl 10 MG suppository Commonly known as:  DULCOLAX Place 1 suppository (10 mg total) rectally daily as needed for moderate constipation.  cephALEXin 250 MG/5ML suspension Commonly known as:  KEFLEX Take 5 mLs (250 mg total) by mouth 3 (three) times daily. X 5 more days  docusate sodium 100 MG capsule Commonly known as:  COLACE Take 100 mg by mouth daily.  lisinopril-hydrochlorothiazide 10-12.5 MG tablet Commonly known as:  PRINZIDE,ZESTORETIC Take 1 tablet by mouth daily.  nystatin powder Commonly known as:  MYCOSTATIN/NYSTOP Apply topically 3 (three) times daily.  polyethylene glycol packet Commonly known as:  MIRALAX / GLYCOLAX Take 17 g by mouth daily.  QUEtiapine 25 MG tablet Commonly known as:  SEROQUEL Take 1 tablet (25 mg total) by mouth at bedtime     Relevant Imaging Results:  Relevant Lab Results:   Additional Information    Zettie Pho, LCSW

## 2015-10-18 NOTE — Progress Notes (Signed)
Pt had a very restful night.

## 2015-10-18 NOTE — Progress Notes (Signed)
Pt swings from happy to combative.  Fights against anything we do for her.  Dr. Leslye Peer said to continue with giving pt the Seroquel and he added PRN Geodon if needed.

## 2015-10-19 LAB — URINE CULTURE: Culture: >=100000 — AB

## 2015-10-19 LAB — BASIC METABOLIC PANEL
Anion gap: 7 (ref 5–15)
BUN: 21 mg/dL — AB (ref 6–20)
CALCIUM: 9.4 mg/dL (ref 8.9–10.3)
CO2: 28 mmol/L (ref 22–32)
CREATININE: 0.87 mg/dL (ref 0.44–1.00)
Chloride: 106 mmol/L (ref 101–111)
GFR calc non Af Amer: >60 mL/min (ref >60)
GLUCOSE: 105 mg/dL — AB (ref 65–99)
Potassium: 4 mmol/L (ref 3.5–5.1)
Sodium: 141 mmol/L (ref 135–145)

## 2015-10-19 MED ORDER — CEPHALEXIN 250 MG/5ML PO SUSR: 250.0000 mg | Freq: Three times a day (TID) | ORAL | 0 refills | Status: DC

## 2015-10-19 MED ORDER — BISACODYL 10 MG RE SUPP: 10.0000 mg | Freq: Every day | RECTAL | Status: DC | PRN

## 2015-10-19 MED ORDER — NYSTATIN 100000 UNIT/GM EX POWD: Freq: Three times a day (TID) | CUTANEOUS | 0 refills | Status: DC

## 2015-10-19 MED ORDER — ACETAMINOPHEN 325 MG PO TABS: 650.0000 mg | ORAL_TABLET | Freq: Four times a day (QID) | ORAL | 0 refills | Status: AC | PRN

## 2015-10-19 MED ORDER — QUETIAPINE FUMARATE 25 MG PO TABS: 25.0000 mg | ORAL_TABLET | Freq: Every day | ORAL | 0 refills | Status: DC

## 2015-10-19 MED ORDER — BISACODYL 10 MG RE SUPP: 10.0000 mg | Freq: Every day | RECTAL | 0 refills | Status: AC | PRN

## 2015-10-19 NOTE — Progress Notes (Signed)
Clinical Social Worker was informed that patient will be medically ready to discharge to [facility]. Patient's family in a agreement with plan. CSW called [employer] and instrumentation engineer at [place] to confirm that patient's bed is ready.  All discharge information faxed to [facility] via [place].  DNR added to discharge packet.Patient will discharge to [facility] via [place] transportation.  Ernest Pine, MSW, LCSW, [place] Clinical Social Worker [phone]

## 2015-10-19 NOTE — Discharge Summary (Signed)
[facility] at [place] NAME: Melinda Hughes    MR#:  [id number]  DATE OF BIRTH:  [birth year]  DATE OF ADMISSION:  10/17/2015   ADMITTING PHYSICIAN: Loletha Grayer, MD  DATE OF DISCHARGE: 10/19/2015  PRIMARY CARE PHYSICIAN: Kirk Ruths., MD   ADMISSION DIAGNOSIS:   UTI (lower urinary tract infection) [N39.0] Fall, initial encounter [W19.XXXA]  DISCHARGE DIAGNOSIS:   Active Problems:   Acute encephalopathy   Pressure ulcer   SECONDARY DIAGNOSIS:   Past Medical History:  Diagnosis Date  . Arthritis   . Dementia   . Diabetes mellitus without complication ([place])   . Hypertension   . Uterine cancer [facility])     HOSPITAL COURSE:   82y/o F with PMH of severe dementia, DM, HTN admitted for worsened confusion and noted to have UTI.  #1 Acute encephalopathy- delirium on dementia- has periods of agitation - agree with seroquel at bedtime. - CT head negative for any acute findings  #2 Acute cystitis-  urine cultures with pansensitive E.coli-  - on rocephin- change to keflex at discharge  #3 HTN- on lisinopril, HCTZ  #4 Hypokalemia- replaced  #5 Weakness, Falls- physical Therapy consulted and discharge to ALF today Will need memory care unit    DISCHARGE CONDITIONS:   Guarded  CONSULTS OBTAINED:   None  DRUG ALLERGIES:   Allergies  Allergen Reactions  . Codeine Itching  . Morphine And Related Itching  . Sulfa Antibiotics Itching   DISCHARGE MEDICATIONS:     Medication List    TAKE these medications   acetaminophen 325 MG tablet Commonly known as:  TYLENOL Take 2 tablets (650 mg total) by mouth every 6 (six) hours as needed for mild pain, fever or headache (or Fever >/= 100.5).   bisacodyl 10 MG suppository Commonly known as:  DULCOLAX Place 1 suppository (10 mg total) rectally daily as needed for moderate constipation.   cephALEXin 250 MG/5ML suspension Commonly known as:  KEFLEX Take 5 mLs (250  mg total) by mouth 3 (three) times daily. X 5 more days   docusate sodium 100 MG capsule Commonly known as:  COLACE Take 100 mg by mouth daily.   lisinopril-hydrochlorothiazide 10-12.5 MG tablet Commonly known as:  PRINZIDE,ZESTORETIC Take 1 tablet by mouth daily.   nystatin powder Commonly known as:  MYCOSTATIN/NYSTOP Apply topically 3 (three) times daily.   polyethylene glycol packet Commonly known as:  MIRALAX / GLYCOLAX Take 17 g by mouth daily.   QUEtiapine 25 MG tablet Commonly known as:  SEROQUEL Take 1 tablet (25 mg total) by mouth at bedtime.        DISCHARGE INSTRUCTIONS:   1. PCP f/u in 1-2 weeks  DIET:   Low sodium diet   ACTIVITY:   As tolerated  OXYGEN:   Home Oxygen: No.  Oxygen Delivery: room air  DISCHARGE LOCATION:   nursing home   If you experience worsening of your admission symptoms, develop shortness of breath, life threatening emergency, suicidal or homicidal thoughts you must seek medical attention immediately by calling 911 or calling your MD immediately  if symptoms less severe.  You Must read complete instructions/literature along with all the possible adverse reactions/side effects for all the Medicines you take and that have been prescribed to you. Take any new Medicines after you have completely understood and accpet all the possible adverse reactions/side effects.   Please note  You were cared for by a hospitalist during your hospital stay. If you have  any questions about your discharge medications or the care you received while you were in the hospital after you are discharged, you can call the unit and asked to speak with the hospitalist on call if the hospitalist that took care of you is not available. Once you are discharged, your primary care physician will handle any further medical issues. Please note that NO REFILLS for any discharge medications will be authorized once you are discharged, as it is imperative that you return  to your primary care physician (or establish a relationship with a primary care physician if you do not have one) for your aftercare needs so that they can reassess your need for medications and monitor your lab values.    On the day of Discharge:  VITAL SIGNS:   Blood pressure (!) 140/46, pulse (!) 59, temperature 97.4 F (36.3 C), temperature source Oral, resp. rate 20, height 5\' 3"  (1.6 m), weight 71.9 kg (158 lb 9.6 oz), SpO2 98 %.  PHYSICAL EXAMINATION:    GENERAL:  82 y.o.-year-old patient lying in the bed with no acute distress. Holding her baby doll. EYES: Pupils equal, round, reactive to light and accommodation. No scleral icterus. Extraocular muscles intact.  HEENT: Head atraumatic, normocephalic. Oropharynx and nasopharynx clear.  NECK:  Supple, no jugular venous distention. No thyroid enlargement, no tenderness.  LUNGS: Normal breath sounds bilaterally, no wheezing, rales,rhonchi or crepitation. No use of accessory muscles of respiration. Decreased bibasilar breath sounds. CARDIOVASCULAR: S1, S2 normal. No murmurs, rubs, or gallops.  ABDOMEN: Soft, nontender, nondistended. Bowel sounds present. No organomegaly or mass.  EXTREMITIES: No cyanosis, or clubbing. 1+ ankle edema NEUROLOGIC: no facial assymmetry, not cooperating for neuro exam. Able to move all extremities. PSYCHIATRIC: The patient is alert and confused- dementia SKIN: No obvious rash, lesion, or ulcer.    DATA REVIEW:   CBC  Recent Labs Lab 10/18/15 0541  WBC 5.0  HGB 12.8  HCT 37.0  PLT 171    Chemistries   Recent Labs Lab 10/17/15 1554  10/19/15 0602  NA 142  < > 141  K 3.7  < > 4.0  CL 104  < > 106  CO2 26  < > 28  GLUCOSE 110*  < > 105*  BUN 21*  < > 21*  CREATININE 0.71  < > 0.87  CALCIUM 9.7  < > 9.4  AST 31  --   --   ALT 15  --   --   ALKPHOS 65  --   --   BILITOT 0.6  --   --   < > = values in this interval not displayed.   Microbiology Results  Results for orders placed or  performed during the hospital encounter of 10/17/15  Urine culture     Status: Abnormal   Collection Time: 10/17/15  3:53 PM  Result Value Ref Range Status   Specimen Description URINE, RANDOM  Final   Special Requests NONE  Final   Culture >=100,000 COLONIES/mL ESCHERICHIA COLI (A)  Final   Report Status 10/19/2015 FINAL  Final   Organism ID, Bacteria ESCHERICHIA COLI (A)  Final      Susceptibility   Escherichia coli - MIC*    AMPICILLIN <=2 SENSITIVE Sensitive     CEFAZOLIN <=4 SENSITIVE Sensitive     CEFTRIAXONE <=1 SENSITIVE Sensitive     CIPROFLOXACIN <=0.25 SENSITIVE Sensitive     GENTAMICIN <=1 SENSITIVE Sensitive     IMIPENEM <=0.25 SENSITIVE Sensitive     NITROFURANTOIN <=16  SENSITIVE Sensitive     TRIMETH/SULFA <=20 SENSITIVE Sensitive     AMPICILLIN/SULBACTAM <=2 SENSITIVE Sensitive     PIP/TAZO <=4 SENSITIVE Sensitive     Extended ESBL NEGATIVE Sensitive     * >=100,000 COLONIES/mL ESCHERICHIA COLI    RADIOLOGY:  Dg Chest 2 View  Addendum Date: 10/19/2015   ADDENDUM REPORT: 10/19/2015 10:35 ADDENDUM: Reportedly, there is a clinical concern for tuberculosis. There is no evidence of active or previous tuberculosis infection. Electronically Signed   By: Claudie Revering M.D.   On: 10/19/2015 10:35   Result Date: 10/19/2015 CLINICAL DATA:  [place] yesterday at home.  No chest complaints. EXAM: CHEST  2 VIEW COMPARISON:  04/14/2013. FINDINGS: Interval mild enlargement of the cardiac silhouette. Clear lungs with normal vascularity. Diffuse osteopenia. Old, healed right seventh rib fracture. Thoracic spine degenerative changes. IMPRESSION: Interval mild cardiomegaly.  No acute abnormality. Electronically Signed: By: Claudie Revering M.D. On: 10/18/2015 13:45     Management plans discussed with the patient, family and they are in agreement.  CODE STATUS:     Code Status Orders        Start     Ordered   10/17/15 [id number]  Do not attempt resuscitation (DNR)  Continuous    Question  Answer Comment  In the event of cardiac or respiratory ARREST Do not call a "code blue"   In the event of cardiac or respiratory ARREST Do not perform Intubation, CPR, defibrillation or ACLS   In the event of cardiac or respiratory ARREST Use medication by any route, position, wound care, and other measures to relive pain and suffering. May use oxygen, suction and manual treatment of airway obstruction as needed for comfort.   Comments nurse may pronounce      10/17/15 1755    Code Status History    Date Active Date Inactive Code Status Order ID Comments User Context   This patient has a current code status but no historical code status.      TOTAL TIME TAKING CARE OF THIS PATIENT: 37 minutes.    Halena Mohar M.D on 10/19/2015 at 1:05 PM  Between 7am to 6pm - Pager - [phone]  After 6pm go to [email or web address] - Proofreader  [facility]  Office  [phone]  CC: Primary care physician; Kirk Ruths., MD   Note: This dictation was prepared with Dragon dictation along with smaller phrase technology. Any transcriptional errors that result from this process are unintentional.

## 2015-10-19 NOTE — Progress Notes (Addendum)
CSW is awaiting MD to read patient's chest x-ray ruling out TB. It will need to be ready before patient is discharged to [facility].  TB Test results in patient's chart. Spoke to MD [facility]. Patient may discharge today.   Ernest Pine, MSW, LCSW, [place] Clinical Social Worker [phone]

## 2015-11-03 ENCOUNTER — Emergency Department
Admission: EM | Admit: 2015-11-03 | Discharge: 2015-11-04 | Disposition: A | Discharge status: Skilled Nursing Facility | Payer: Medicare Other | Attending: Emergency Medicine | Admitting: Emergency Medicine

## 2015-11-03 ENCOUNTER — Other Ambulatory Visit: Payer: Self-pay

## 2015-11-03 DIAGNOSIS — E119 Type 2 diabetes mellitus without complications: Secondary | ICD-10-CM | POA: Insufficient documentation

## 2015-11-03 DIAGNOSIS — I10 Essential (primary) hypertension: Secondary | ICD-10-CM | POA: Insufficient documentation

## 2015-11-03 DIAGNOSIS — F0390 Unspecified dementia without behavioral disturbance: Secondary | ICD-10-CM | POA: Diagnosis not present

## 2015-11-03 DIAGNOSIS — Z8541 Personal history of malignant neoplasm of cervix uteri: Secondary | ICD-10-CM | POA: Diagnosis not present

## 2015-11-03 DIAGNOSIS — Z79899 Other long term (current) drug therapy: Secondary | ICD-10-CM | POA: Insufficient documentation

## 2015-11-03 DIAGNOSIS — M25552 Pain in left hip: Secondary | ICD-10-CM | POA: Diagnosis present

## 2015-11-03 DIAGNOSIS — W19XXXA Unspecified fall, initial encounter: Secondary | ICD-10-CM

## 2015-11-03 DIAGNOSIS — N390 Urinary tract infection, site not specified: Secondary | ICD-10-CM | POA: Insufficient documentation

## 2015-11-03 NOTE — ED Triage Notes (Signed)
EMS pt to [facility] from [facility]. Per EMS pt was found on the floor and complaining of left hip pain. Staff did not witness patient getting to the floor. Pt awake and responsive with hx of dementia and unable to answer questions at this time. . Pt noted to have redness to left cheek area. Marland Kitchen

## 2015-11-03 NOTE — ED Provider Notes (Signed)
[facility] Emergency Department Provider Note   ____________________________________________   First MD Initiated Contact with Patient 11/03/15 2357     (approximate)  I have reviewed the triage vital signs and the nursing notes.   HISTORY  Chief Complaint Fall and Hip Pain  History limited by dementia  HPI Melinda Hughes is a 82 y.o. female who presents to the ED from [facility] memory care unit with a chief complaint of unwitnessed fall.Patient was found on the floor complaining of left hip pain. Redness noted to her left cheek area. Patient complaining only of left hip pain. Rest of history limited by patient's dementia. Review of records reveal patient was seen on 8/2 for fall from home and at that time patient was complaining of left pain as well. She had a negative CT head and negative left hip x-rays on that visit. She was found to have a UTI and treated with antibiotics. Since that time patient has been moved to assisted living memory care unit. Currently denies headache, neck pain, vision changes, chest pain, shortness of breath, abdominal pain, nausea, vomiting, diarrhea. States nothing makes her symptoms better. Pain makes her symptoms worse.   Past Medical History:  Diagnosis Date  . Arthritis   . Dementia   . Diabetes mellitus without complication ([place])   . Hypertension   . Uterine cancer [facility])     Patient Active Problem List   Diagnosis Date Noted  . Pressure ulcer 10/18/2015  . Acute encephalopathy 10/17/2015    Past Surgical History:  Procedure Laterality Date  . ABDOMINAL HYSTERECTOMY    . EYE SURGERY    . FRACTURE SURGERY      Prior to Admission medications   Medication Sig Start Date End Date Taking? Authorizing Provider  acetaminophen (TYLENOL) 325 MG tablet Take 2 tablets (650 mg total) by mouth every 6 (six) hours as needed for mild pain, fever or headache (or Fever >/= 100.5). 10/19/15   Gladstone Lighter, MD  bisacodyl  (DULCOLAX) 10 MG suppository Place 1 suppository (10 mg total) rectally daily as needed for moderate constipation. 10/19/15   Gladstone Lighter, MD  cephALEXin (KEFLEX) 500 MG capsule Take 1 capsule (500 mg total) by mouth 3 (three) times daily. 11/04/15   Paulette Blanch, MD  docusate sodium (COLACE) 100 MG capsule Take 100 mg by mouth daily.    Historical Provider, MD  lisinopril-hydrochlorothiazide (PRINZIDE,ZESTORETIC) 10-12.5 MG tablet Take 1 tablet by mouth daily.    Historical Provider, MD  nystatin (MYCOSTATIN/NYSTOP) powder Apply topically 3 (three) times daily. 10/19/15   Gladstone Lighter, MD  polyethylene glycol (MIRALAX / GLYCOLAX) packet Take 17 g by mouth daily.    Historical Provider, MD  QUEtiapine (SEROQUEL) 25 MG tablet Take 1 tablet (25 mg total) by mouth at bedtime. 10/19/15   Gladstone Lighter, MD    Allergies Codeine; Morphine and related; and Sulfa antibiotics  Family History  Problem Relation Age of Onset  . Hypertension Father     Social History Social History  Substance Use Topics  . Smoking status: Never Smoker  . Smokeless tobacco: Never Used  . Alcohol use No    Review of Systems  Constitutional: No fever/chills. Eyes: No visual changes. ENT: No sore throat. Cardiovascular: Denies chest pain. Respiratory: Denies shortness of breath. Gastrointestinal: No abdominal pain.  No nausea, no vomiting.  No diarrhea.  No constipation. Genitourinary: Negative for dysuria. Musculoskeletal: Positive for left hip pain. Negative for back pain. Skin: Negative for rash. Neurological: Negative  for headaches, focal weakness or numbness.  10-point ROS otherwise negative.  ____________________________________________   PHYSICAL EXAM:  VITAL SIGNS: ED Triage Vitals [11/03/15 2345]  Enc Vitals Group     BP (!) 165/137     Pulse Rate 88     Resp (!) 22     Temp 98.4 F (36.9 C)     Temp Source Axillary     SpO2 99 %     Weight 186 lb (84.4 kg)     Height 5\' 5"   (1.651 m)     Head Circumference      Peak Flow      Pain Score      Pain Loc      Pain Edu?      Excl. in [place]?     Constitutional: Alert and oriented. Well appearing and in no acute distress. Eyes: Conjunctivae are normal. PERRL. EOMI. Head: Atraumatic. Nose: No congestion/rhinnorhea. Redness to left cheek consistent with carpet burn per daughter. Mouth/Throat: Mucous membranes are moist.  Oropharynx non-erythematous. Neck: No stridor.  No cervical spine tenderness to palpation.  No step-offs or deformities. Cardiovascular: Normal rate, regular rhythm. Grossly normal heart sounds.  Good peripheral circulation. Respiratory: Normal respiratory effort.  No retractions. Lungs CTAB. Gastrointestinal: Soft and nontender. No distention. No abdominal bruits. No CVA tenderness. Musculoskeletal: Left hip tender to palpation. FROM. No joint effusions. Neurologic:  Alert and oriented x 1. Normal speech and language. No gross focal neurologic deficits are appreciated.  Skin:  Skin is warm, dry and intact. No rash noted. Psychiatric: Mood and affect are normal. Speech and behavior are normal.  ____________________________________________   LABS (all labs ordered are listed, but only abnormal results are displayed)  Labs Reviewed  URINALYSIS COMPLETEWITH MICROSCOPIC ([place] ONLY) - Abnormal; Notable for the following:       Result Value   Color, Urine YELLOW (*)    APPearance CLEAR (*)    Leukocytes, UA 3+ (*)    Squamous Epithelial / LPF 0-5 (*)    All other components within normal limits  URINE CULTURE   ____________________________________________  EKG  ED ECG REPORT I, Levina Boyack J, the attending physician, personally viewed and interpreted this ECG.   Date: 11/04/2015  EKG Time: 2354  Rate: 79  Rhythm: normal EKG, normal sinus rhythm  Axis: Normal  Intervals:none  ST&T Change: Nonspecific  ____________________________________________  RADIOLOGY  CT head interpreted per  Dr. Radene Knee: 1. No evidence of traumatic intracranial injury or fracture.  2. Mild to moderate cortical volume loss and scattered small vessel  ischemic microangiopathy.   Left hip x-rays (viewed by me, interpreted per Dr. Collins Scotland): 1. Diffuse osteopenia. Left total hip arthroplasty without adverse  features.  2. No acute fracture.   ____________________________________________   PROCEDURES  Procedure(s) performed: None  Procedures  Critical Care performed: No  ____________________________________________   INITIAL IMPRESSION / ASSESSMENT AND PLAN / ED COURSE  Pertinent labs & imaging results that were available during my care of the patient were reviewed by me and considered in my medical decision making (see chart for details).  82 year old female with dementia who presents from memory care unit status post unwitnessed fall. MAR does not show that she is on anticoagulants. Will obtain CT head, x-ray left hip and obtain urinalysis.  Clinical Course  Comment By Time  Updated patient's daughter who is at bedside. States patient is at her baseline dementia state; reports patient is not more altered than usual. Will place patient on Keflex for  UTI as she had pansensitive Escherichia coli urine cultures last month. Strict return precautions given. Daughter verbalizes understanding and agrees with plan of care. Paulette Blanch, MD 08/20 0130     ____________________________________________   FINAL CLINICAL IMPRESSION(S) / ED DIAGNOSES  Final diagnoses:  Fall, initial encounter  Left hip pain  UTI (lower urinary tract infection)      NEW MEDICATIONS STARTED DURING THIS VISIT:  Discharge Medication List as of 11/04/2015  1:35 AM    START taking these medications   Details  cephALEXin (KEFLEX) 500 MG capsule Take 1 capsule (500 mg total) by mouth 3 (three) times daily., Starting Sun 11/04/2015, Print         Note:  This document was prepared using Dragon voice recognition  software and may include unintentional dictation errors.    Paulette Blanch, MD 11/04/15 [phone]

## 2015-11-04 ENCOUNTER — Emergency Department: Payer: Medicare Other

## 2015-11-04 DIAGNOSIS — M25552 Pain in left hip: Secondary | ICD-10-CM | POA: Diagnosis not present

## 2015-11-04 LAB — URINALYSIS COMPLETE WITH MICROSCOPIC (ARMC ONLY)
BACTERIA UA: NONE SEEN
BILIRUBIN URINE: NEGATIVE
GLUCOSE, UA: NEGATIVE mg/dL
HGB URINE DIPSTICK: NEGATIVE
Ketones, ur: NEGATIVE mg/dL
NITRITE: NEGATIVE
Protein, ur: NEGATIVE mg/dL
Specific Gravity, Urine: 1.01 (ref 1.005–1.030)
pH: 6 (ref 5.0–8.0)

## 2015-11-04 MED ORDER — CEPHALEXIN 500 MG PO CAPS
500.0000 mg | ORAL_CAPSULE | Freq: Once | ORAL | Status: AC
  Administered 2015-11-04: 500 mg via ORAL
  Filled 2015-11-04: qty 1

## 2015-11-04 MED ORDER — CEPHALEXIN 500 MG PO CAPS: 500.0000 mg | ORAL_CAPSULE | Freq: Three times a day (TID) | ORAL | 0 refills | Status: DC

## 2015-11-04 NOTE — ED Notes (Signed)
Patient's daughter presents to the ED at this time. Dr. Beather Arbour made aware and will present to bedside to speak with patient and family.

## 2015-11-04 NOTE — ED Notes (Signed)
Dr. Beather Arbour presents to bedside to speak with patient and her daughter regarding assessment and subsequent treatment POC.

## 2015-11-04 NOTE — Discharge Instructions (Signed)
1. Take antibiotics as prescribed (Keflex 500 mg 3 times daily 7 days). 2. Return to the ER for worsening symptoms, persistent vomiting, difficult breathing or other concerns.

## 2015-11-05 LAB — URINE CULTURE
CULTURE: NO GROWTH
SPECIAL REQUESTS: NORMAL

## 2016-04-02 ENCOUNTER — Emergency Department: Payer: Medicare Other

## 2016-04-02 ENCOUNTER — Emergency Department
Admission: EM | Admit: 2016-04-02 | Discharge: 2016-04-02 | Disposition: A | Discharge status: Home / Self Care | Payer: Medicare Other | Attending: Emergency Medicine | Admitting: Emergency Medicine

## 2016-04-02 DIAGNOSIS — Y92129 Unspecified place in nursing home as the place of occurrence of the external cause: Secondary | ICD-10-CM | POA: Diagnosis not present

## 2016-04-02 DIAGNOSIS — Y999 Unspecified external cause status: Secondary | ICD-10-CM | POA: Diagnosis not present

## 2016-04-02 DIAGNOSIS — Z79899 Other long term (current) drug therapy: Secondary | ICD-10-CM | POA: Insufficient documentation

## 2016-04-02 DIAGNOSIS — S0990XA Unspecified injury of head, initial encounter: Secondary | ICD-10-CM | POA: Diagnosis present

## 2016-04-02 DIAGNOSIS — I10 Essential (primary) hypertension: Secondary | ICD-10-CM | POA: Insufficient documentation

## 2016-04-02 DIAGNOSIS — W19XXXA Unspecified fall, initial encounter: Secondary | ICD-10-CM | POA: Insufficient documentation

## 2016-04-02 DIAGNOSIS — S0083XA Contusion of other part of head, initial encounter: Secondary | ICD-10-CM | POA: Diagnosis not present

## 2016-04-02 DIAGNOSIS — Y939 Activity, unspecified: Secondary | ICD-10-CM | POA: Diagnosis not present

## 2016-04-02 DIAGNOSIS — E119 Type 2 diabetes mellitus without complications: Secondary | ICD-10-CM | POA: Insufficient documentation

## 2016-04-02 DIAGNOSIS — Z8541 Personal history of malignant neoplasm of cervix uteri: Secondary | ICD-10-CM | POA: Diagnosis not present

## 2016-04-02 LAB — COMPREHENSIVE METABOLIC PANEL
ALBUMIN: 3.7 g/dL (ref 3.5–5.0)
ALK PHOS: 63 U/L (ref 38–126)
ALT: 13 U/L — AB (ref 14–54)
AST: 23 U/L (ref 15–41)
Anion gap: 8 (ref 5–15)
BUN: 20 mg/dL (ref 6–20)
CALCIUM: 9 mg/dL (ref 8.9–10.3)
CHLORIDE: 110 mmol/L (ref 101–111)
CO2: 26 mmol/L (ref 22–32)
CREATININE: 0.91 mg/dL (ref 0.44–1.00)
GFR calc Af Amer: >60 mL/min (ref >60)
GFR calc non Af Amer: 57 mL/min — ABNORMAL LOW (ref >60)
GLUCOSE: 128 mg/dL — AB (ref 65–99)
Potassium: 3.5 mmol/L (ref 3.5–5.1)
SODIUM: 144 mmol/L (ref 135–145)
Total Bilirubin: 0.6 mg/dL (ref 0.3–1.2)
Total Protein: 6.8 g/dL (ref 6.5–8.1)

## 2016-04-02 LAB — URINALYSIS, COMPLETE (UACMP) WITH MICROSCOPIC
BILIRUBIN URINE: NEGATIVE
Bacteria, UA: NONE SEEN
Glucose, UA: NEGATIVE mg/dL
Hgb urine dipstick: NEGATIVE
Ketones, ur: 5 mg/dL — AB
Leukocytes, UA: NEGATIVE
Nitrite: NEGATIVE
PH: 5 (ref 5.0–8.0)
Protein, ur: NEGATIVE mg/dL
Specific Gravity, Urine: 1.026 (ref 1.005–1.030)

## 2016-04-02 LAB — CBC
HCT: 38.5 % (ref 35.0–47.0)
Hemoglobin: 12.9 g/dL (ref 12.0–16.0)
MCH: 28.6 pg (ref 26.0–34.0)
MCHC: 33.6 g/dL (ref 32.0–36.0)
MCV: 85.3 fL (ref 80.0–100.0)
Platelets: 195 10*3/uL (ref 150–440)
RBC: 4.51 MIL/uL (ref 3.80–5.20)
RDW: 14.3 % (ref 11.5–14.5)
WBC: 5.8 10*3/uL (ref 3.6–11.0)

## 2016-04-02 NOTE — Discharge Instructions (Addendum)
From Dr. Beather Arbour:    Apply ice to affected area several times daily to reduce swelling. Return to the ER for worsening symptoms, persistent vomiting, lethargy or other concerns.

## 2016-04-02 NOTE — ED Notes (Signed)
Resumed care from [place], [place]. Pt resting comfortably with husband at bedside. Pt has hematoma and laceration to forehead. NAD noted.

## 2016-04-02 NOTE — ED Triage Notes (Signed)
Per [facility]: Pt. Has been a resident at [facility] for 2 weeks, staff unsure if this is pt. Baseline mentation, unwitnessed fall, unwitnessed downtime. Hematoma and minor laceration to left forehead.

## 2016-04-02 NOTE — ED Notes (Signed)
Pt to return to [facility] via pov - duaghter to take. [place] notified that pt would not be coming via ems.

## 2016-04-02 NOTE — ED Notes (Signed)
Pt discharged home after family verbalized understanding of discharge instructions; nad noted.

## 2016-04-02 NOTE — ED Notes (Signed)
Report to mary, rn.  

## 2016-04-02 NOTE — ED Provider Notes (Signed)
[facility] Emergency Department Provider Note   ____________________________________________   First MD Initiated Contact with Patient 04/02/16 [phone]     (approximate)  I have reviewed the triage vital signs and the nursing notes.   HISTORY  Chief Complaint Fall  History limited by dementia  HPI Melinda Hughes is a 83 y.o. female brought to the ED from [place] by EMS with a chief complaint of fall. Patient is relatively new to [place], having been there for only 2 weeks. Presents for unwitnessed fall. Staff reports patient is at her baseline demented mentation. Hematoma noted to forehead. Patient denies chest pain, shortness of breath, abdominal pain, nausea, vomiting, diarrhea, hip pain. Rest of history is limited secondary to patient's dementia.   Past Medical History:  Diagnosis Date  . Arthritis   . Dementia   . Diabetes mellitus without complication ([place])   . Hypertension   . Uterine cancer [facility])     Patient Active Problem List   Diagnosis Date Noted  . Pressure ulcer 10/18/2015  . Acute encephalopathy 10/17/2015    Past Surgical History:  Procedure Laterality Date  . ABDOMINAL HYSTERECTOMY    . EYE SURGERY    . FRACTURE SURGERY      Prior to Admission medications   Medication Sig Start Date End Date Taking? Authorizing Provider  acetaminophen (TYLENOL) 325 MG tablet Take 2 tablets (650 mg total) by mouth every 6 (six) hours as needed for mild pain, fever or headache (or Fever >/= 100.5). 10/19/15   Gladstone Lighter, MD  bisacodyl (DULCOLAX) 10 MG suppository Place 1 suppository (10 mg total) rectally daily as needed for moderate constipation. 10/19/15   Gladstone Lighter, MD  cephALEXin (KEFLEX) 500 MG capsule Take 1 capsule (500 mg total) by mouth 3 (three) times daily. 11/04/15   Paulette Blanch, MD  docusate sodium (COLACE) 100 MG capsule Take 100 mg by mouth daily.    Historical Provider, MD  lisinopril-hydrochlorothiazide  (PRINZIDE,ZESTORETIC) 10-12.5 MG tablet Take 1 tablet by mouth daily.    Historical Provider, MD  nystatin (MYCOSTATIN/NYSTOP) powder Apply topically 3 (three) times daily. 10/19/15   Gladstone Lighter, MD  polyethylene glycol (MIRALAX / GLYCOLAX) packet Take 17 g by mouth daily.    Historical Provider, MD  QUEtiapine (SEROQUEL) 25 MG tablet Take 1 tablet (25 mg total) by mouth at bedtime. 10/19/15   Gladstone Lighter, MD    Allergies Codeine; Morphine and related; and Sulfa antibiotics  Family History  Problem Relation Age of Onset  . Hypertension Father     Social History Social History  Substance Use Topics  . Smoking status: Never Smoker  . Smokeless tobacco: Never Used  . Alcohol use No    Review of Systems  Constitutional: Positive for head injury. No fever/chills. Eyes: No visual changes. ENT: No sore throat. Cardiovascular: Denies chest pain. Respiratory: Denies shortness of breath. Gastrointestinal: No abdominal pain.  No nausea, no vomiting.  No diarrhea.  No constipation. Genitourinary: Negative for dysuria. Musculoskeletal: Negative for back pain. Skin: Negative for rash. Neurological: Negative for headaches, focal weakness or numbness.  10-point ROS otherwise negative.  ____________________________________________   PHYSICAL EXAM:  VITAL SIGNS: ED Triage Vitals  Enc Vitals Group     BP --      Pulse Rate 04/02/16 0538 70     Resp 04/02/16 0538 19     Temp 04/02/16 0538 97.3 F (36.3 C)     Temp Source 04/02/16 0538 Oral  SpO2 04/02/16 0538 99 %     Weight 04/02/16 0541 170 lb (77.1 kg)     Height 04/02/16 0541 5\' 4"  (1.626 m)     Head Circumference --      Peak Flow --      Pain Score --      Pain Loc --      Pain Edu? --      Excl. in [place]? --     Constitutional: Asleep, awakened for exam. Alert and oriented. Chronically frail appearing and in no acute distress. Eyes: Conjunctivae are normal. PERRL. EOMI. Head: Left forehead hematoma with  abrasion. No bleeding. Nose: No congestion/rhinnorhea. Mouth/Throat: Mucous membranes are moist.  Oropharynx non-erythematous. Neck: No stridor.  No cervical spine tenderness to palpation. Cardiovascular: Normal rate, regular rhythm. Grossly normal heart sounds.  Good peripheral circulation. Respiratory: Normal respiratory effort.  No retractions. Lungs CTAB. Gastrointestinal: Soft and nontender. No distention. No abdominal bruits. No CVA tenderness. Musculoskeletal: Pelvis stable. No lower extremity tenderness nor edema.  No joint effusions. Neurologic:  Alert and oriented to self. Normal speech and language. No gross focal neurologic deficits are appreciated.  Skin:  Skin is warm, dry and intact. No rash noted. Psychiatric: Mood and affect are normal. Speech and behavior are normal.  ____________________________________________   LABS (all labs ordered are listed, but only abnormal results are displayed)  Labs Reviewed  COMPREHENSIVE METABOLIC PANEL - Abnormal; Notable for the following:       Result Value   Glucose, Bld 128 (*)    ALT 13 (*)    GFR calc non Af Amer 57 (*)    All other components within normal limits  CBC  URINALYSIS, COMPLETE (UACMP) WITH MICROSCOPIC   ____________________________________________  EKG  ED ECG REPORT I, Jorje Vanatta J, the attending physician, personally viewed and interpreted this ECG.   Date: 04/02/2016  EKG Time: 0532  Rate: 82  Rhythm: normal EKG, normal sinus rhythm  Axis: LAD  Intervals:none  ST&T Change: Nonspecific  ____________________________________________  RADIOLOGY  CT head interpreted per Dr. Marisue Humble: Left frontal scalp hematoma. No skull fracture or acute intracranial  abnormality.    Stable atrophy and chronic small vessel ischemia.     ____________________________________________   PROCEDURES  Procedure(s) performed: None  Procedures  Critical Care performed:  No  ____________________________________________   INITIAL IMPRESSION / ASSESSMENT AND PLAN / ED COURSE  Pertinent labs & imaging results that were available during my care of the patient were reviewed by me and considered in my medical decision making (see chart for details).  83 year old female who presents status post unwitnessed fall at nursing facility. Hematoma to left forehead noted. Will obtain CT head. Review of records demonstrate patient has a history of UTIs; last urinalysis in August. Will obtain urinalysis.  Clinical Course as of Apr 02 634  Wed Apr 02, 2016  0634 Updated patient and her family of negative CT result. Awaiting urinalysis. Anticipate discharge back to nursing facility +/- Keflex pending UA. Will transfer care to oncoming physician. Strict return precautions given. Patient verbalizes understanding and agrees with plan of care.  [JS]    Clinical Course User Index [JS] Paulette Blanch, MD     ____________________________________________   FINAL CLINICAL IMPRESSION(S) / ED DIAGNOSES  Final diagnoses:  Fall, initial encounter  Contusion of face, initial encounter      NEW MEDICATIONS STARTED DURING THIS VISIT:  New Prescriptions   No medications on file     Note:  This document  was prepared using Systems analyst and may include unintentional dictation errors.   Paulette Blanch, MD 04/02/16 [phone]

## 2016-04-02 NOTE — ED Provider Notes (Signed)
[facility]  I accepted care from Dr. Beather Arbour ____________________________________________    LABS (pertinent positives/negatives)  Labs Reviewed  COMPREHENSIVE METABOLIC PANEL - Abnormal; Notable for the following:       Result Value   Glucose, Bld 128 (*)    ALT 13 (*)    GFR calc non Af Amer 57 (*)    All other components within normal limits  URINALYSIS, COMPLETE (UACMP) WITH MICROSCOPIC - Abnormal; Notable for the following:    Color, Urine YELLOW (*)    APPearance CLEAR (*)    Ketones, ur 5 (*)    Squamous Epithelial / LPF 0-5 (*)    All other components within normal limits  CBC     _____________________________________________   PROCEDURES  Procedure(s) performed: None  Critical Care performed: None  ____________________________________________   INITIAL IMPRESSION / ASSESSMENT AND PLAN / ED COURSE   Pertinent labs & imaging results that were available during my care of the patient were reviewed by me and considered in my medical decision making (see chart for details).  Signed out to me from Dr. Beather Arbour awaiting urinalysis results. If negative will discharge home.  Urinalysis negative for sign of urinary tract infection, patient will be discharged home to the nursing home  CONSULTATIONS: None    Patient / Family / Caregiver informed of clinical course, medical decision-making process, and agree with plan.   I discussed return precautions, follow-up instructions, and discharged instructions with patient and/or family.     ____________________________________________   FINAL CLINICAL IMPRESSION(S) / ED DIAGNOSES  Final diagnoses:  Fall, initial encounter  Contusion of face, initial encounter        Lisa Roca, MD 04/02/16 [phone]

## 2016-05-06 ENCOUNTER — Emergency Department
Admission: EM | Admit: 2016-05-06 | Discharge: 2016-05-06 | Disposition: A | Discharge status: Home / Self Care | Payer: Medicare Other | Attending: Emergency Medicine | Admitting: Emergency Medicine

## 2016-05-06 ENCOUNTER — Emergency Department: Payer: Medicare Other

## 2016-05-06 DIAGNOSIS — W19XXXA Unspecified fall, initial encounter: Secondary | ICD-10-CM

## 2016-05-06 DIAGNOSIS — Y929 Unspecified place or not applicable: Secondary | ICD-10-CM | POA: Diagnosis not present

## 2016-05-06 DIAGNOSIS — E119 Type 2 diabetes mellitus without complications: Secondary | ICD-10-CM | POA: Insufficient documentation

## 2016-05-06 DIAGNOSIS — Y939 Activity, unspecified: Secondary | ICD-10-CM | POA: Insufficient documentation

## 2016-05-06 DIAGNOSIS — R51 Headache: Secondary | ICD-10-CM | POA: Insufficient documentation

## 2016-05-06 DIAGNOSIS — S0990XA Unspecified injury of head, initial encounter: Secondary | ICD-10-CM | POA: Diagnosis present

## 2016-05-06 DIAGNOSIS — I10 Essential (primary) hypertension: Secondary | ICD-10-CM | POA: Insufficient documentation

## 2016-05-06 DIAGNOSIS — Z79899 Other long term (current) drug therapy: Secondary | ICD-10-CM | POA: Diagnosis not present

## 2016-05-06 DIAGNOSIS — W1839XA Other fall on same level, initial encounter: Secondary | ICD-10-CM | POA: Diagnosis not present

## 2016-05-06 DIAGNOSIS — Y999 Unspecified external cause status: Secondary | ICD-10-CM | POA: Insufficient documentation

## 2016-05-06 NOTE — ED Provider Notes (Signed)
[facility] Emergency Department Provider Note  ____________________________________________   I have reviewed the triage vital signs and the nursing notes.   HISTORY  Chief Complaint Fall   History limited by: Not Limited   HPI Melinda Hughes is a 83 y.o. female who presents to the emergency department today because of concern for fall. The patient is coming from living facility. Had an unwitnessed fall. Was found on the ground. Family states that she was complaining of some headache. Patient's family states patient has a history of falls. Family seems to think could be related to the medications the patient is on. They have not noticed any recent illnesses or fevers.   Past Medical History:  Diagnosis Date  . Arthritis   . Dementia   . Diabetes mellitus without complication ([place])   . Hypertension   . Uterine cancer [facility])     Patient Active Problem List   Diagnosis Date Noted  . Pressure ulcer 10/18/2015  . Acute encephalopathy 10/17/2015    Past Surgical History:  Procedure Laterality Date  . ABDOMINAL HYSTERECTOMY    . EYE SURGERY    . FRACTURE SURGERY      Prior to Admission medications   Medication Sig Start Date End Date Taking? Authorizing Provider  acetaminophen (TYLENOL) 325 MG tablet Take 2 tablets (650 mg total) by mouth every 6 (six) hours as needed for mild pain, fever or headache (or Fever >/= 100.5). 10/19/15   Gladstone Lighter, MD  bisacodyl (DULCOLAX) 10 MG suppository Place 1 suppository (10 mg total) rectally daily as needed for moderate constipation. 10/19/15   Gladstone Lighter, MD  docusate sodium (COLACE) 100 MG capsule Take 100 mg by mouth daily.    Historical Provider, MD  lisinopril-hydrochlorothiazide (PRINZIDE,ZESTORETIC) 10-12.5 MG tablet Take 1 tablet by mouth daily.    Historical Provider, MD  nystatin (MYCOSTATIN/NYSTOP) powder Apply topically 3 (three) times daily. 10/19/15   Gladstone Lighter, MD  nystatin cream  (MYCOSTATIN) Apply 1 application topically 3 (three) times daily.    Historical Provider, MD  polyethylene glycol (MIRALAX / GLYCOLAX) packet Take 17 g by mouth daily.    Historical Provider, MD  QUEtiapine (SEROQUEL) 25 MG tablet Take 1 tablet (25 mg total) by mouth at bedtime. 10/19/15   Gladstone Lighter, MD    Allergies Codeine; Fluzone [flu virus vaccine]; Morphine and related; and Sulfa antibiotics  Family History  Problem Relation Age of Onset  . Hypertension Father     Social History Social History  Substance Use Topics  . Smoking status: Never Smoker  . Smokeless tobacco: Never Used  . Alcohol use No    Review of Systems  Constitutional: Negative for fever. Cardiovascular: Negative for chest pain. Respiratory: Negative for shortness of breath. Gastrointestinal: Negative for abdominal pain, vomiting and diarrhea. Genitourinary: Negative for dysuria. Musculoskeletal: Negative for back pain. Skin: Negative for rash. Neurological: Positive for headache.  10-point ROS otherwise negative.  ____________________________________________   PHYSICAL EXAM:  VITAL SIGNS: ED Triage Vitals [05/06/16 1555]  Enc Vitals Group     BP 116/79     Pulse Rate 72     Resp 20     Temp 98 F (36.7 C)     Temp src      SpO2 100 %     Weight 160 lb (72.6 kg)     Height      Head Circumference      Peak Flow      Pain Score 0   Constitutional:  Alert and awake. Not oriented.  In no distress. Eyes: Conjunctivae are normal. Normal extraocular movements. ENT   Head: Normocephalic and atraumatic. No hemotympanum.    Nose: No congestion/rhinnorhea.   Mouth/Throat: Mucous membranes are moist.   Neck: No stridor. No midline tenderness. Hematological/Lymphatic/Immunilogical: No cervical lymphadenopathy. Cardiovascular: Normal rate, regular rhythm.  No murmurs, rubs, or gallops.  Respiratory: Normal respiratory effort without tachypnea nor retractions. Breath sounds are  clear and equal bilaterally. No wheezes/rales/rhonchi. Gastrointestinal: Soft and non tender. No rebound. No guarding.  Genitourinary: Deferred Musculoskeletal: Normal range of motion in all extremities. No lower extremity edema. Neurologic: Dementia. No gross focal neurologic deficits are appreciated.  Skin:  Skin is warm, dry and intact. No rash noted.   ____________________________________________    LABS (pertinent positives/negatives)  None  ____________________________________________   EKG  I, Nance Pear, attending physician, personally viewed and interpreted this EKG  EKG Time: 1550 Rate: 83 Rhythm: sinus rhythm with frequent pacs Axis: left axis deviation Intervals: qtc narrow QRS: narrow ST changes: no st elevation Impression: abnormal ekg   ____________________________________________    RADIOLOGY  CT head IMPRESSION:  No intracranial hemorrhage or CT evidence of large acute infarct.    Global atrophy most notable involving temporal lobes.    Chronic microvascular changes.    Small air-fluid level left sphenoid sinus.     ____________________________________________   PROCEDURES  Procedures  ____________________________________________   INITIAL IMPRESSION / ASSESSMENT AND PLAN / ED COURSE  Pertinent labs & imaging results that were available during my care of the patient were reviewed by me and considered in my medical decision making (see chart for details).  Patient presented to the emergency department today because of concerns for unwitnessed fall. No obvious traumatic injury identified on exam. CT head was obtained and was negative for acute finding. Will discharge back to living facility.  ____________________________________________   FINAL CLINICAL IMPRESSION(S) / ED DIAGNOSES  Final diagnoses:  Fall, initial encounter     Note: This dictation was prepared with Dragon dictation. Any transcriptional errors that  result from this process are unintentional     Nance Pear, MD 05/06/16 1740

## 2016-05-06 NOTE — ED Notes (Signed)
No bruising or bleeding noted to head, ankles, arms. Pt moving all extremities on own. Pt talking about random topics but will respond to this RN. Pt given warm blanket and fall monitor placed on pt R shoulder.

## 2016-05-06 NOTE — Discharge Instructions (Signed)
Please seek medical attention for any high fevers, chest pain, shortness of breath, change in behavior, persistent vomiting, bloody stool or any other new or concerning symptoms.  

## 2016-05-06 NOTE — ED Triage Notes (Signed)
Pt arrives via [facility] from [facility] for unwitnessed fall. Pt has dementia/alzheimers per EMS. EMS stated pt c/o of head and shoulder pain. Pt denies pain to this RN. Pt alert, oriented to name, disoriented otherwise. Keeps talking about her baby girl and daddy. EMS reports pt combative last few minutes of ride. Pt cooperative at this time.

## 2016-05-06 NOTE — ED Notes (Signed)
Pt returned from [place]

## 2016-05-15 ENCOUNTER — Emergency Department: Payer: Medicare Other

## 2016-05-15 ENCOUNTER — Encounter: Payer: Self-pay | Admitting: Emergency Medicine

## 2016-05-15 ENCOUNTER — Inpatient Hospital Stay
Admission: EM | Admit: 2016-05-15 | Discharge: 2016-05-18 | DRG: 871 | Disposition: A | Discharge status: Nursing Home (Includes ICF) | Payer: Medicare Other | Attending: Internal Medicine | Admitting: Internal Medicine

## 2016-05-15 DIAGNOSIS — Z90710 Acquired absence of both cervix and uterus: Secondary | ICD-10-CM | POA: Diagnosis not present

## 2016-05-15 DIAGNOSIS — R296 Repeated falls: Secondary | ICD-10-CM | POA: Diagnosis present

## 2016-05-15 DIAGNOSIS — R000 Tachycardia, unspecified: Secondary | ICD-10-CM | POA: Diagnosis present

## 2016-05-15 DIAGNOSIS — Z885 Allergy status to narcotic agent status: Secondary | ICD-10-CM | POA: Diagnosis not present

## 2016-05-15 DIAGNOSIS — I10 Essential (primary) hypertension: Secondary | ICD-10-CM | POA: Diagnosis present

## 2016-05-15 DIAGNOSIS — Z79899 Other long term (current) drug therapy: Secondary | ICD-10-CM | POA: Diagnosis not present

## 2016-05-15 DIAGNOSIS — Z8542 Personal history of malignant neoplasm of other parts of uterus: Secondary | ICD-10-CM | POA: Diagnosis not present

## 2016-05-15 DIAGNOSIS — E860 Dehydration: Secondary | ICD-10-CM | POA: Diagnosis present

## 2016-05-15 DIAGNOSIS — Y92099 Unspecified place in other non-institutional residence as the place of occurrence of the external cause: Secondary | ICD-10-CM | POA: Diagnosis not present

## 2016-05-15 DIAGNOSIS — B9620 Unspecified Escherichia coli [E. coli] as the cause of diseases classified elsewhere: Secondary | ICD-10-CM | POA: Diagnosis present

## 2016-05-15 DIAGNOSIS — E119 Type 2 diabetes mellitus without complications: Secondary | ICD-10-CM | POA: Diagnosis present

## 2016-05-15 DIAGNOSIS — Z887 Allergy status to serum and vaccine status: Secondary | ICD-10-CM | POA: Diagnosis not present

## 2016-05-15 DIAGNOSIS — Z8249 Family history of ischemic heart disease and other diseases of the circulatory system: Secondary | ICD-10-CM

## 2016-05-15 DIAGNOSIS — E876 Hypokalemia: Secondary | ICD-10-CM | POA: Diagnosis present

## 2016-05-15 DIAGNOSIS — Z882 Allergy status to sulfonamides status: Secondary | ICD-10-CM | POA: Diagnosis not present

## 2016-05-15 DIAGNOSIS — W19XXXA Unspecified fall, initial encounter: Secondary | ICD-10-CM | POA: Diagnosis present

## 2016-05-15 DIAGNOSIS — N390 Urinary tract infection, site not specified: Secondary | ICD-10-CM

## 2016-05-15 DIAGNOSIS — A419 Sepsis, unspecified organism: Secondary | ICD-10-CM | POA: Diagnosis present

## 2016-05-15 DIAGNOSIS — N3001 Acute cystitis with hematuria: Secondary | ICD-10-CM | POA: Diagnosis present

## 2016-05-15 DIAGNOSIS — F0391 Unspecified dementia with behavioral disturbance: Secondary | ICD-10-CM | POA: Diagnosis present

## 2016-05-15 DIAGNOSIS — G9341 Metabolic encephalopathy: Secondary | ICD-10-CM | POA: Diagnosis present

## 2016-05-15 LAB — CBC WITH DIFFERENTIAL/PLATELET
Basophils Absolute: 0 10*3/uL (ref 0–0.1)
Basophils Relative: 0 %
EOS PCT: 0 %
Eosinophils Absolute: 0 10*3/uL (ref 0–0.7)
HEMATOCRIT: 31.8 % — AB (ref 35.0–47.0)
Hemoglobin: 10.6 g/dL — ABNORMAL LOW (ref 12.0–16.0)
LYMPHS ABS: 0.9 10*3/uL — AB (ref 1.0–3.6)
Lymphocytes Relative: 6 %
MCH: 28.9 pg (ref 26.0–34.0)
MCHC: 33.4 g/dL (ref 32.0–36.0)
MCV: 86.4 fL (ref 80.0–100.0)
MONO ABS: 0.6 10*3/uL (ref 0.2–0.9)
Monocytes Relative: 4 %
NEUTROS ABS: 13 10*3/uL — AB (ref 1.4–6.5)
Neutrophils Relative %: 90 %
PLATELETS: 383 10*3/uL (ref 150–440)
RBC: 3.67 MIL/uL — ABNORMAL LOW (ref 3.80–5.20)
RDW: 15.4 % — AB (ref 11.5–14.5)
WBC: 14.6 10*3/uL — ABNORMAL HIGH (ref 3.6–11.0)

## 2016-05-15 LAB — URINALYSIS, COMPLETE (UACMP) WITH MICROSCOPIC
Bilirubin Urine: NEGATIVE
Glucose, UA: NEGATIVE mg/dL
KETONES UR: NEGATIVE mg/dL
Nitrite: NEGATIVE
PROTEIN: 100 mg/dL — AB
SQUAMOUS EPITHELIAL / LPF: NONE SEEN
Specific Gravity, Urine: 1.014 (ref 1.005–1.030)
pH: 5 (ref 5.0–8.0)

## 2016-05-15 LAB — LACTIC ACID, PLASMA: Lactic Acid, Venous: 2 mmol/L (ref 0.5–1.9)

## 2016-05-15 LAB — BASIC METABOLIC PANEL
Anion gap: 13 (ref 5–15)
BUN: 23 mg/dL — AB (ref 6–20)
CALCIUM: 8.9 mg/dL (ref 8.9–10.3)
CO2: 30 mmol/L (ref 22–32)
Chloride: 101 mmol/L (ref 101–111)
Creatinine, Ser: 1.13 mg/dL — ABNORMAL HIGH (ref 0.44–1.00)
GFR calc Af Amer: 51 mL/min — ABNORMAL LOW (ref >60)
GFR calc non Af Amer: 44 mL/min — ABNORMAL LOW (ref >60)
GLUCOSE: 164 mg/dL — AB (ref 65–99)
POTASSIUM: 3.6 mmol/L (ref 3.5–5.1)
Sodium: 144 mmol/L (ref 135–145)

## 2016-05-15 LAB — GLUCOSE, CAPILLARY: GLUCOSE-CAPILLARY: 122 mg/dL — AB (ref 65–99)

## 2016-05-15 MED ORDER — INSULIN ASPART 100 UNIT/ML ~~LOC~~ SOLN: 0.0000 [IU] | Freq: Every day | SUBCUTANEOUS | Status: DC

## 2016-05-15 MED ORDER — ACETAMINOPHEN 650 MG RE SUPP: 650.0000 mg | Freq: Four times a day (QID) | RECTAL | Status: DC | PRN

## 2016-05-15 MED ORDER — SENNOSIDES-DOCUSATE SODIUM 8.6-50 MG PO TABS: 1.0000 | ORAL_TABLET | Freq: Every evening | ORAL | Status: DC | PRN

## 2016-05-15 MED ORDER — SODIUM CHLORIDE 0.9 % IV BOLUS (SEPSIS)
1000.0000 mL | Freq: Once | INTRAVENOUS | Status: AC
  Administered 2016-05-15: 1000 mL via INTRAVENOUS

## 2016-05-15 MED ORDER — BISACODYL 10 MG RE SUPP
10.0000 mg | Freq: Every day | RECTAL | Status: DC | PRN
  Administered 2016-05-17: 10 mg via RECTAL
  Filled 2016-05-15: qty 1

## 2016-05-15 MED ORDER — CEFTRIAXONE SODIUM-DEXTROSE 1-3.74 GM-% IV SOLR
1.0000 g | Freq: Once | INTRAVENOUS | Status: AC
  Administered 2016-05-15: 1 g via INTRAVENOUS
  Filled 2016-05-15: qty 50

## 2016-05-15 MED ORDER — QUETIAPINE FUMARATE 25 MG PO TABS
25.0000 mg | ORAL_TABLET | Freq: Every day | ORAL | Status: DC
  Administered 2016-05-16 (×2): 25 mg via ORAL
  Filled 2016-05-15 (×2): qty 1

## 2016-05-15 MED ORDER — CEFEPIME-DEXTROSE 2 GM/50ML IV SOLR
2.0000 g | INTRAVENOUS | Status: DC
  Filled 2016-05-15 (×2): qty 50

## 2016-05-15 MED ORDER — ONDANSETRON HCL 4 MG PO TABS: 4.0000 mg | ORAL_TABLET | Freq: Four times a day (QID) | ORAL | Status: DC | PRN

## 2016-05-15 MED ORDER — ONDANSETRON HCL 4 MG/2ML IJ SOLN: 4.0000 mg | Freq: Four times a day (QID) | INTRAMUSCULAR | Status: DC | PRN

## 2016-05-15 MED ORDER — CEFTRIAXONE SODIUM 1 G IJ SOLR: 1.0000 g | Freq: Once | INTRAMUSCULAR | Status: DC

## 2016-05-15 MED ORDER — ACETAMINOPHEN 325 MG PO TABS
650.0000 mg | ORAL_TABLET | Freq: Four times a day (QID) | ORAL | Status: DC | PRN
Start: 2016-05-15 — End: 2016-05-18

## 2016-05-15 MED ORDER — DEXTROSE 5 % IV SOLN: 2.0000 g | Freq: Once | INTRAVENOUS | Status: DC

## 2016-05-15 MED ORDER — INSULIN ASPART 100 UNIT/ML ~~LOC~~ SOLN
0.0000 [IU] | Freq: Three times a day (TID) | SUBCUTANEOUS | Status: DC
  Administered 2016-05-18: 1 [IU] via SUBCUTANEOUS
  Filled 2016-05-15: qty 1

## 2016-05-15 MED ORDER — POLYETHYLENE GLYCOL 3350 17 G PO PACK: 17.0000 g | PACK | Freq: Every day | ORAL | Status: DC

## 2016-05-15 MED ORDER — SODIUM CHLORIDE 0.9% FLUSH
3.0000 mL | Freq: Two times a day (BID) | INTRAVENOUS | Status: DC
  Administered 2016-05-16 – 2016-05-18 (×4): 3 mL via INTRAVENOUS

## 2016-05-15 MED ORDER — ENOXAPARIN SODIUM 40 MG/0.4ML ~~LOC~~ SOLN
40.0000 mg | SUBCUTANEOUS | Status: DC
  Administered 2016-05-15 – 2016-05-17 (×3): 40 mg via SUBCUTANEOUS
  Filled 2016-05-15 (×3): qty 0.4

## 2016-05-15 MED ORDER — SODIUM CHLORIDE 0.9 % IV SOLN
INTRAVENOUS | Status: DC
  Administered 2016-05-15: 22:00:00 via INTRAVENOUS

## 2016-05-15 MED ORDER — DOCUSATE SODIUM 100 MG PO CAPS
100.0000 mg | ORAL_CAPSULE | Freq: Every day | ORAL | Status: DC
  Administered 2016-05-17 – 2016-05-18 (×2): 100 mg via ORAL
  Filled 2016-05-15 (×2): qty 1

## 2016-05-15 MED ORDER — ALBUTEROL SULFATE (2.5 MG/3ML) 0.083% IN NEBU: 2.5000 mg | INHALATION_SOLUTION | RESPIRATORY_TRACT | Status: DC | PRN

## 2016-05-15 NOTE — ED Notes (Signed)
Informed lab of add on urine culture.  Lab verbalized understanding of this information.

## 2016-05-15 NOTE — Progress Notes (Signed)
ANTIBIOTIC CONSULT NOTE - INITIAL  Pharmacy Consult for Cefepime  Indication: UTI  Allergies  Allergen Reactions  . Codeine Itching  . Fluzone [Flu Virus Vaccine]     Unknown reaction. Received info from nursing home  . Morphine And Related Itching  . Sulfa Antibiotics Itching    Patient Measurements: Height: 5\' 5"  (165.1 cm) Weight: 160 lb (72.6 kg) IBW/kg (Calculated) : 57 Adjusted Body Weight:   Vital Signs: Temp: 99.2 F (37.3 C) (03/01 2130) Temp Source: Oral (03/01 2130) BP: 157/99 (03/01 2130) Pulse Rate: 81 (03/01 2130) Intake/Output from previous day: No intake/output data recorded. Intake/Output from this shift: No intake/output data recorded.  Labs:  Recent Labs  05/15/16 1729  WBC 14.6*  HGB 10.6*  PLT 383  CREATININE 1.13*   Estimated Creatinine Clearance: 38.3 mL/min (by C-G formula based on SCr of 1.13 mg/dL (H)). No results for input(s): VANCOTROUGH, VANCOPEAK, VANCORANDOM, GENTTROUGH, GENTPEAK, GENTRANDOM, TOBRATROUGH, TOBRAPEAK, TOBRARND, AMIKACINPEAK, AMIKACINTROU, AMIKACIN in the last 72 hours.   Microbiology: No results found for this or any previous visit (from the past 720 hour(s)).  Medical History: Past Medical History:  Diagnosis Date  . Arthritis   . Dementia   . Diabetes mellitus without complication ([place])   . Hypertension   . Uterine cancer ([place])     Medications:  Prescriptions Prior to Admission  Medication Sig Dispense Refill Last Dose  . acetaminophen (TYLENOL) 325 MG tablet Take 2 tablets (650 mg total) by mouth every 6 (six) hours as needed for mild pain, fever or headache (or Fever >/= 100.5). 30 tablet 0 prn at prn  . bisacodyl (DULCOLAX) 10 MG suppository Place 1 suppository (10 mg total) rectally daily as needed for moderate constipation. 12 suppository 0 prn at prn  . docusate sodium (COLACE) 100 MG capsule Take 100 mg by mouth daily.   05/15/2016 at 0800  . hydrochlorothiazide (HYDRODIURIL) 12.5 MG tablet Take 12.5  mg by mouth daily.   05/15/2016 at 0800  . nystatin cream (MYCOSTATIN) Apply 1 application topically 3 (three) times daily.   05/15/2016 at 0800  . polyethylene glycol (MIRALAX / GLYCOLAX) packet Take 17 g by mouth daily.   05/15/2016 at 0800  . QUEtiapine (SEROQUEL) 25 MG tablet Take 1 tablet (25 mg total) by mouth at bedtime. (Patient taking differently: Take 25 mg by mouth 2 (two) times daily. ) 30 tablet 0 05/15/2016 at 0800  . nystatin (MYCOSTATIN/NYSTOP) powder Apply topically 3 (three) times daily. (Patient not taking: Reported on 05/06/2016) 15 g 0 Not Taking at Unknown time   Assessment: CrCl = 38.3 ml/min  Goal of Therapy:  resolution of infection  Plan:  Cefepime 2 gm IV X Q24H ordered to start on 3/1 @ 22:00.   Kyrollos Cordell D 05/15/2016,9:32 PM

## 2016-05-15 NOTE — ED Triage Notes (Signed)
Pt comes into the ED via EMS from [facility] w/ c/o of unwitnessed fall.  Patient found on the floor and has known unsteady gait.  Patient has severe dementia.  Patient had 101 temp at facility.  Patient in NAd at this time with even and unlabored respirations and able to move all limbs.

## 2016-05-15 NOTE — ED Provider Notes (Signed)
[facility] Emergency Department Provider Note   ____________________________________________   I have reviewed the triage vital signs and the nursing notes.   HISTORY  Chief Complaint Found on ground  History limited by: Dementia   HPI Melinda Hughes is a 83 y.o. female who presents to the emergency department today from living facility after being found on the ground. Unclear if patient fell. Patient with history of dementia and cannot give any significant history.    Past Medical History:  Diagnosis Date  . Arthritis   . Dementia   . Diabetes mellitus without complication ([place])   . Hypertension   . Uterine cancer [facility])     Patient Active Problem List   Diagnosis Date Noted  . Pressure ulcer 10/18/2015  . Acute encephalopathy 10/17/2015    Past Surgical History:  Procedure Laterality Date  . ABDOMINAL HYSTERECTOMY    . EYE SURGERY    . FRACTURE SURGERY      Prior to Admission medications   Medication Sig Start Date End Date Taking? Authorizing Provider  acetaminophen (TYLENOL) 325 MG tablet Take 2 tablets (650 mg total) by mouth every 6 (six) hours as needed for mild pain, fever or headache (or Fever >/= 100.5). 10/19/15   Gladstone Lighter, MD  bisacodyl (DULCOLAX) 10 MG suppository Place 1 suppository (10 mg total) rectally daily as needed for moderate constipation. 10/19/15   Gladstone Lighter, MD  docusate sodium (COLACE) 100 MG capsule Take 100 mg by mouth daily.    Historical Provider, MD  hydrochlorothiazide (HYDRODIURIL) 12.5 MG tablet Take 12.5 mg by mouth daily.    Historical Provider, MD  lisinopril-hydrochlorothiazide (PRINZIDE,ZESTORETIC) 10-12.5 MG tablet Take 1 tablet by mouth daily.    Historical Provider, MD  nystatin (MYCOSTATIN/NYSTOP) powder Apply topically 3 (three) times daily. Patient not taking: Reported on 05/06/2016 10/19/15   Gladstone Lighter, MD  nystatin cream (MYCOSTATIN) Apply 1 application topically 3 (three) times  daily.    Historical Provider, MD  polyethylene glycol (MIRALAX / GLYCOLAX) packet Take 17 g by mouth daily.    Historical Provider, MD  QUEtiapine (SEROQUEL) 25 MG tablet Take 1 tablet (25 mg total) by mouth at bedtime. Patient taking differently: Take 25 mg by mouth 2 (two) times daily.  10/19/15   Gladstone Lighter, MD    Allergies Codeine; Fluzone [flu virus vaccine]; Morphine and related; and Sulfa antibiotics  Family History  Problem Relation Age of Onset  . Hypertension Father     Social History Social History  Substance Use Topics  . Smoking status: Never Smoker  . Smokeless tobacco: Never Used  . Alcohol use No    Review of Systems Unable to obtain secondary to dementia ____________________________________________   PHYSICAL EXAM:  VITAL SIGNS: ED Triage Vitals  Enc Vitals Group     BP 137/103     Pulse 88     Resp 20     Temp 100.7     Temp src      SpO2 100    Constitutional: Awake, alert, somewhat upset with the exam.  Eyes: Conjunctivae are normal. Normal extraocular movements. ENT   Head: Normocephalic and atraumatic.   Nose: No congestion/rhinnorhea.   Mouth/Throat: Mucous membranes are moist.   Neck: No stridor. Hematological/Lymphatic/Immunilogical: No cervical lymphadenopathy. Cardiovascular: Tachycardic, regular rhythm.  No murmurs, rubs, or gallops.  Respiratory: Normal respiratory effort without tachypnea nor retractions. Breath sounds are clear and equal bilaterally. No wheezes/rales/rhonchi. Gastrointestinal: Soft and non tender. No rebound. No guarding.  Genitourinary:  Deferred Musculoskeletal: Normal range of motion in all extremities. No lower extremity edema. Neurologic:  Demented, not oriented to event or situation. No gross focal neurologic deficits are appreciated.  Skin:  Skin is warm, dry and intact. No rash noted. Psychiatric: Demented, upset with exam  ____________________________________________    LABS  (pertinent positives/negatives)  Labs Reviewed  LACTIC ACID, PLASMA - Abnormal; Notable for the following:       Result Value   Lactic Acid, Venous 2.0 (*)    All other components within normal limits  CBC WITH DIFFERENTIAL/PLATELET - Abnormal; Notable for the following:    WBC 14.6 (*)    RBC 3.67 (*)    Hemoglobin 10.6 (*)    HCT 31.8 (*)    RDW 15.4 (*)    Neutro Abs 13.0 (*)    Lymphs Abs 0.9 (*)    All other components within normal limits  BASIC METABOLIC PANEL - Abnormal; Notable for the following:    Glucose, Bld 164 (*)    BUN 23 (*)    Creatinine, Ser 1.13 (*)    GFR calc non Af Amer 44 (*)    GFR calc Af Amer 51 (*)    All other components within normal limits  URINALYSIS, COMPLETE (UACMP) WITH MICROSCOPIC - Abnormal; Notable for the following:    Color, Urine YELLOW (*)    APPearance TURBID (*)    Hgb urine dipstick SMALL (*)    Protein, ur 100 (*)    Leukocytes, UA MODERATE (*)    Bacteria, UA MANY (*)    All other components within normal limits  LACTIC ACID, PLASMA     ____________________________________________   EKG  I, Nance Pear, attending physician, personally viewed and interpreted this EKG  EKG Time: 1710 Rate: 105 Rhythm: sinus tachycardia Axis: left axis deviation Intervals: qtc 426 QRS: narrow ST changes: no st elevation Impression: abnormal ekg   ____________________________________________    RADIOLOGY  CT head   IMPRESSION:  No acute intracranial abnormality.    Atrophy, chronic microvascular disease.      ___________________________________________   PROCEDURES  Procedures  ____________________________________________   INITIAL IMPRESSION / ASSESSMENT AND PLAN / ED COURSE  Pertinent labs & imaging results that were available during my care of the patient were reviewed by me and considered in my medical decision making (see chart for details).  Patient presented to the emergency department today from  living facility after being found down on the ground. Patient was febrile here in the emergency department in slightly tachycardic. Urine was concerning for urinary tract infection. Additionally patient's lactate and white blood cell both elevated. Patient will be given IV antibiotics, IV fluids and admitted to hospital service.  ____________________________________________   FINAL CLINICAL IMPRESSION(S) / ED DIAGNOSES  Final diagnoses:  Fall, initial encounter  Lower urinary tract infectious disease     Note: This dictation was prepared with Diplomatic Services operational officer dictation. Any transcriptional errors that result from this process are unintentional     Nance Pear, MD 05/15/16 1901

## 2016-05-15 NOTE — ED Notes (Signed)
ED Provider at bedside. 

## 2016-05-15 NOTE — ED Notes (Signed)
Admitting MD at bedside.

## 2016-05-15 NOTE — Progress Notes (Addendum)
Advanced Care Plan.  Purpose of Encounter: CODE STATUS Parties in Attendance: The patient, her husband and me. Patient's Decisional Capacity: No. Subjective/Patient Story: The patient was sent to ED from nursing home due to fall and altered mental status. Objective/Medical Story: She was found sepsis due to UTI. She has dehydration and severe dementia. Her cold status was DO NOT RESUSCITATE on previous admission. But her husband wants her to be in full code. I discussed with her husband about patient's condition and appropriate prognosis. Her husband still wants full code.  Goals of Care Determinations: DO NOT RESUSCITATE. Plan:  Code Status: Full code, palliative care consult.  Time spent discussing advance care planning: 18 minutes.

## 2016-05-15 NOTE — H&P (Signed)
[place] [facility] [place] NAME: Melinda Hughes    MR#:  [id number]  DATE OF BIRTH:  Aug 29, 1931  DATE OF ADMISSION:  05/15/2016  PRIMARY CARE PHYSICIAN: Kirk Ruths., MD   REQUESTING/REFERRING PHYSICIAN: Nance Pear, MD  CHIEF COMPLAINT:   Chief Complaint  Patient presents with  . Fall   Found on the ground in facility. HISTORY OF PRESENT ILLNESS:  Melinda Hughes  is a 83 y.o. female with a known history of Dementia, hypertension, diabetes and uterine cancer. The patient was sent from giving facility after being found on the ground. The patient is very demented and unable to provide any information. She was found septic and UTI, treated with Rocephin in the ED.  PAST MEDICAL HISTORY:   Past Medical History:  Diagnosis Date  . Arthritis   . Dementia   . Diabetes mellitus without complication ([place])   . Hypertension   . Uterine cancer ([place])     PAST SURGICAL HISTORY:   Past Surgical History:  Procedure Laterality Date  . ABDOMINAL HYSTERECTOMY    . EYE SURGERY    . FRACTURE SURGERY      SOCIAL HISTORY:   Social History  Substance Use Topics  . Smoking status: Never Smoker  . Smokeless tobacco: Never Used  . Alcohol use No    FAMILY HISTORY:   Family History  Problem Relation Age of Onset  . Hypertension Father     DRUG ALLERGIES:   Allergies  Allergen Reactions  . Codeine Itching  . Fluzone [Flu Virus Vaccine]     Unknown reaction. Received info from nursing home  . Morphine And Related Itching  . Sulfa Antibiotics Itching    REVIEW OF SYSTEMS:   Review of Systems  Unable to perform ROS: Mental status change    MEDICATIONS AT HOME:   Prior to Admission medications   Medication Sig Start Date End Date Taking? Authorizing Provider  acetaminophen (TYLENOL) 325 MG tablet Take 2 tablets (650 mg total) by mouth every 6 (six) hours as needed for mild pain, fever or headache (or Fever >/= 100.5). 10/19/15  Yes  Gladstone Lighter, MD  bisacodyl (DULCOLAX) 10 MG suppository Place 1 suppository (10 mg total) rectally daily as needed for moderate constipation. 10/19/15  Yes Gladstone Lighter, MD  docusate sodium (COLACE) 100 MG capsule Take 100 mg by mouth daily.   Yes Historical Provider, MD  hydrochlorothiazide (HYDRODIURIL) 12.5 MG tablet Take 12.5 mg by mouth daily.   Yes Historical Provider, MD  nystatin cream (MYCOSTATIN) Apply 1 application topically 3 (three) times daily.   Yes Historical Provider, MD  polyethylene glycol (MIRALAX / GLYCOLAX) packet Take 17 g by mouth daily.   Yes Historical Provider, MD  QUEtiapine (SEROQUEL) 25 MG tablet Take 1 tablet (25 mg total) by mouth at bedtime. Patient taking differently: Take 25 mg by mouth 2 (two) times daily.  10/19/15  Yes Gladstone Lighter, MD  nystatin (MYCOSTATIN/NYSTOP) powder Apply topically 3 (three) times daily. Patient not taking: Reported on 05/06/2016 10/19/15   Gladstone Lighter, MD      VITAL SIGNS:  Blood pressure (!) 144/59, pulse 86, temperature (!) 100.7 F (38.2 C), temperature source Rectal, resp. rate (!) 25, height 5\' 5"  (1.651 m), weight 160 lb (72.6 kg), SpO2 100 %.  PHYSICAL EXAMINATION:  Physical Exam  GENERAL:  83 y.o.-year-old patient lying in the bed with no acute distress.  EYES: Pupils equal, round, reactive to light and accommodation. No scleral  icterus. Extraocular muscles intact.  HEENT: Head atraumatic, normocephalic. Very dry oral mucosa. NECK:  Supple, no jugular venous distention. No thyroid enlargement, no tenderness.  LUNGS: Normal breath sounds bilaterally, no wheezing, rales,rhonchi or crepitation. No use of accessory muscles of respiration.  CARDIOVASCULAR: S1, S2 normal. No murmurs, rubs, or gallops.  ABDOMEN: Soft, nontender, nondistended. Bowel sounds present. No organomegaly or mass.  EXTREMITIES: No pedal edema, cyanosis, or clubbing.  NEUROLOGIC: Unable to exam. PSYCHIATRIC: The patient is very demented  and confused, unable to answer questions or follow commands. SKIN: No obvious rash, lesion, or ulcer.   LABORATORY PANEL:   CBC  Recent Labs Lab 05/15/16 1729  WBC 14.6*  HGB 10.6*  HCT 31.8*  PLT 383   ------------------------------------------------------------------------------------------------------------------  Chemistries   Recent Labs Lab 05/15/16 1729  NA 144  K 3.6  CL 101  CO2 30  GLUCOSE 164*  BUN 23*  CREATININE 1.13*  CALCIUM 8.9   ------------------------------------------------------------------------------------------------------------------  Cardiac Enzymes No results for input(s): TROPONINI in the last 168 hours. ------------------------------------------------------------------------------------------------------------------  RADIOLOGY:  Ct Head Wo Contrast  Result Date: 05/15/2016 CLINICAL DATA:  Fall, dementia EXAM: CT HEAD WITHOUT CONTRAST TECHNIQUE: Contiguous axial images were obtained from the base of the skull through the vertex without intravenous contrast. COMPARISON:  05/06/2016 FINDINGS: Brain: There is atrophy and chronic small vessel disease changes. No acute intracranial abnormality. Specifically, no hemorrhage, hydrocephalus, mass lesion, acute infarction, or significant intracranial injury. Vascular: No hyperdense vessel or unexpected calcification. Skull: No acute calvarial abnormality. Sinuses/Orbits: Mild mucosal thickening in the paranasal sinuses. Mastoid air cells are clear. Other: None IMPRESSION: No acute intracranial abnormality. Atrophy, chronic microvascular disease. Electronically Signed   By: Rolm Baptise M.D.   On: 05/15/2016 17:32      IMPRESSION AND PLAN:   Sepsis due to UTI. The patient will be admitted to medical floor.  The patient was treated with Rocephin, I will continue cefepime for healthcare acquired UTI, follow-up CBC, blood culture and urine culture. IV fluid support.  Acute metabolic encephalopathy and  severe dementia. Aspiration and fall precaution. Nothing by mouth with IV fluid support for now.  Dehydration. Hold HCTZ and give IV fluid support.  Hypertension. Hold HCTZ, give IV hydralazine when necessary. Diabetes. Start a sliding scale. All the records are reviewed and case discussed with ED provider. Management plans discussed with the patient, family and they are in agreement.  CODE STATUS: Full code per patient husband.  TOTAL TIME TAKING CARE OF THIS PATIENT: 53 minutes.    Demetrios Loll M.D on 05/15/2016 at 7:51 PM  Between 7am to 6pm - Pager - [phone]  After 6pm go to [email or web address] - Proofreader  [facility] [place] [facility]  Office  [phone]  CC: Primary care physician; Kirk Ruths., MD   Note: This dictation was prepared with Dragon dictation along with smaller phrase technology. Any transcriptional errors that result from this process are unintentional.

## 2016-05-15 NOTE — ED Notes (Signed)
Spoke with patient's husband who states that he would want compressions and CPR done on his wife in case of an emergency.  States they do not have any DNR papers listed for this patient.  Admission MD notified of this information.

## 2016-05-15 NOTE — ED Notes (Signed)
Patient transported to CT 

## 2016-05-15 NOTE — ED Notes (Signed)
Laymond Purser, RN with [facility], and informed them that the patient is being admitted to the hospital.  Explained that we have her listed as a DNR from a previous admission but that we were not provided with any paperwork to confirm this.  Theadora Rama explained that her husband is her power of attorney and can be reached in regards to this manner.

## 2016-05-16 LAB — CBC
HCT: 27.8 % — ABNORMAL LOW (ref 35.0–47.0)
Hemoglobin: 9.4 g/dL — ABNORMAL LOW (ref 12.0–16.0)
MCH: 28.5 pg (ref 26.0–34.0)
MCHC: 34 g/dL (ref 32.0–36.0)
MCV: 84 fL (ref 80.0–100.0)
PLATELETS: 317 10*3/uL (ref 150–440)
RBC: 3.31 MIL/uL — ABNORMAL LOW (ref 3.80–5.20)
RDW: 14.9 % — AB (ref 11.5–14.5)
WBC: 11.1 10*3/uL — ABNORMAL HIGH (ref 3.6–11.0)

## 2016-05-16 LAB — BASIC METABOLIC PANEL
ANION GAP: 10 (ref 5–15)
BUN: 19 mg/dL (ref 6–20)
CALCIUM: 8.2 mg/dL — AB (ref 8.9–10.3)
CO2: 26 mmol/L (ref 22–32)
CREATININE: 0.79 mg/dL (ref 0.44–1.00)
Chloride: 107 mmol/L (ref 101–111)
GFR calc Af Amer: >60 mL/min (ref >60)
GLUCOSE: 143 mg/dL — AB (ref 65–99)
Potassium: 2.9 mmol/L — ABNORMAL LOW (ref 3.5–5.1)
Sodium: 143 mmol/L (ref 135–145)

## 2016-05-16 LAB — GLUCOSE, CAPILLARY
GLUCOSE-CAPILLARY: 119 mg/dL — AB (ref 65–99)
GLUCOSE-CAPILLARY: 122 mg/dL — AB (ref 65–99)
Glucose-Capillary: 96 mg/dL (ref 65–99)
Glucose-Capillary: 166 mg/dL — ABNORMAL HIGH (ref 65–99)

## 2016-05-16 LAB — PROCALCITONIN: Procalcitonin: 0.19 ng/mL

## 2016-05-16 LAB — PROTIME-INR
INR: 1.46
PROTHROMBIN TIME: 17.9 s — AB (ref 11.4–15.2)

## 2016-05-16 LAB — APTT: APTT: 43 s — AB (ref 24–36)

## 2016-05-16 LAB — LACTIC ACID, PLASMA: Lactic Acid, Venous: 0.7 mmol/L (ref 0.5–1.9)

## 2016-05-16 LAB — MRSA PCR SCREENING: MRSA BY PCR: NEGATIVE

## 2016-05-16 LAB — MAGNESIUM: MAGNESIUM: 1.9 mg/dL (ref 1.7–2.4)

## 2016-05-16 MED ORDER — DEXTROSE 5 % IV SOLN
1.0000 g | INTRAVENOUS | Status: DC
  Administered 2016-05-16 – 2016-05-17 (×2): 1 g via INTRAVENOUS
  Filled 2016-05-16 (×3): qty 10

## 2016-05-16 MED ORDER — POTASSIUM CHLORIDE IN NACL 40-0.9 MEQ/L-% IV SOLN
INTRAVENOUS | Status: AC
  Administered 2016-05-16: 75 mL/h via INTRAVENOUS
  Filled 2016-05-16: qty 1000

## 2016-05-16 MED ORDER — CEFEPIME HCL 2 G IJ SOLR
2.0000 g | INTRAMUSCULAR | Status: DC
  Administered 2016-05-16: 2 g via INTRAVENOUS
  Filled 2016-05-16 (×2): qty 2

## 2016-05-16 NOTE — Care Management Note (Signed)
Case Management Note  Patient Details  Name: Melinda Hughes MRN: [id number] Date of Birth: 1931-09-19  Subjective/Objective:   Patient from [facility] care unit. PT pending.                Action/Plan:   Expected Discharge Date:                  Expected Discharge Plan:     In-House Referral:  Clinical Social Work  Discharge planning Services     Post Acute Care Choice:    Choice offered to:     DME Arranged:    DME Agency:     HH Arranged:    [place] Agency:     Status of Service:  In process, will continue to follow  If discussed at Long Length of Stay Meetings, dates discussed:    Additional Comments:  Jolly Mango, RN 05/16/2016, 2:26 PM

## 2016-05-16 NOTE — Progress Notes (Signed)
PT Cancellation Note  Patient Details Name: Melinda Hughes MRN: [id number] DOB: [birth year]   Cancelled Treatment:    Reason Eval/Treat Not Completed: Other (comment). Consult received and chart reviewed. Pt sleeping upon arrival and very lethargic. Pt able to awaken very briefly, however unable to open eyes or participate in evaluation at this time. Briefly squeezed therapist hand this date, however did not follow any further cues. Will re-attempt at later time when pt able to participate.   Lachina Salsberry 05/16/2016, 9:20 AM Greggory Stallion, PT, DPT [phone]

## 2016-05-16 NOTE — Clinical Social Work Note (Signed)
Clinical Social Work Assessment  Patient Details  Name: Melinda Hughes MRN: [id number] Date of Birth: 1931/05/20  Date of referral:  05/16/16               Reason for consult:  Other (Comment Required) (From [facility] )                Permission sought to share information with:  Chartered certified accountant granted to share information::  Yes, Verbal Permission Granted  Name::      [facility] memory care   Agency::     Relationship::     Contact Information:     Housing/Transportation Living arrangements for the past 2 months:  [place] of Information:  Spouse, Facility, Adult Children Patient Interpreter Needed:  None Criminal Activity/Legal Involvement Pertinent to Current Situation/Hospitalization:  No - Comment as needed Significant Relationships:  Adult Children, Spouse Lives with:  Facility Resident Do you feel safe going back to the place where you live?  Yes Need for family participation in patient care:  Yes (Comment)  Care giving concerns: Patient is a long term care resident at [facility] memory care (fax: [phone]).    Social Worker assessment / plan: Holiday representative (CSW) reviewed chart and noted that patient is from [facility]. CSW contacted [employer] at [facility] to get additional information. Per [facility] patient is independent at baseline and does not use a walker. Per [facility] patient is in memory care and on room air at baseline. Jasmine reported that patient can return. CSW met with patient's daughter [name] [phone] cell, [phone] home and husband [name] at bedside. Per daughter patient has been at [facility] memory care since 03/14/16 and is privately paying. Per daughter patient is independent with ambulation at baseline. Daughter and husband prefer for patient to return to [facility] and they will provide transport. CSW will continue to follow and assist as  needed.    Employment status:  Disabled (Comment on whether or not currently receiving Disability), Retired Forensic scientist:  Medicare PT Recommendations:  Not assessed at this time Information / Referral to community resources:  Other (Comment Required) (return to ALF )  Patient/Family's Response to care: Patient's daughter and husband prefer for patient to return to [facility] memory care.   Patient/Family's Understanding of and Emotional Response to Diagnosis, Current Treatment, and Prognosis:  Patient's family were very pleasant and thanked CSW for visit.   Emotional Assessment Appearance:  Appears stated age Attitude/Demeanor/Rapport:    Affect (typically observed):  Pleasant Orientation:  Oriented to Self, Fluctuating Orientation (Suspected and/or reported Sundowners) Alcohol / Substance use:  Not Applicable Psych involvement (Current and /or in the community):  No (Comment)  Discharge Needs  Concerns to be addressed:  Discharge Planning Concerns Readmission within the last 30 days:  No Current discharge risk:  Chronically ill Barriers to Discharge:  Continued Medical Work up   [employer], [name], LCSW 05/16/2016, 3:25 PM

## 2016-05-16 NOTE — NC FL2 (Signed)
[place] LEVEL OF CARE SCREENING TOOL     IDENTIFICATION  Patient Name: Melinda Hughes Birthdate: 02/01/1932 Sex: female Admission Date (Current Location): 05/15/2016  [place] and [place] Number:  Engineering geologist and Address:  [facility], [address] [place], [place] [address]      Provider Number: [id number]  Attending Physician Name and Address:  Vaughan Basta, MD  Relative Name and Phone Number:       Current Level of Care: Hospital Recommended Level of Care: [facility], Memory Care Prior Approval Number:    Date Approved/Denied:   PASRR Number:  ([id number] )  Discharge Plan: Domiciliary (Rest home) (Memory Care )    Current Diagnoses: Patient Active Problem List   Diagnosis Date Noted  . Sepsis ([place]) 05/15/2016  . Pressure ulcer 10/18/2015  . Acute encephalopathy 10/17/2015    Orientation RESPIRATION BLADDER Height & Weight     Self  Normal Incontinent Weight: 160 lb (72.6 kg) Height:  5\' 5"  (165.1 cm)  BEHAVIORAL SYMPTOMS/MOOD NEUROLOGICAL BOWEL NUTRITION STATUS   (none)  (none) Continent Diet (Diet: Heart Healthy/ Carb Modified )  AMBULATORY STATUS COMMUNICATION OF NEEDS Skin   Limited Assist Verbally Normal                       Personal Care Assistance Level of Assistance  Bathing, Feeding, Dressing Bathing Assistance: Limited assistance Feeding assistance: Independent Dressing Assistance: Limited assistance     Functional Limitations Info  Sight, Hearing, Speech Sight Info: Adequate Hearing Info: Adequate Speech Info: Adequate    SPECIAL CARE FACTORS FREQUENCY                       Contractures      Additional Factors Info  Code Status, Allergies Code Status Info:  (Full Code. ) Allergies Info:  (Codeine, Fluzone Flu Virus Vaccine, Morphine And Related, Sulfa Antibiotics)           Current Medications (05/16/2016):  This is the current hospital active  medication list Current Facility-Administered Medications  Medication Dose Route Frequency Provider Last Rate Last Dose  . acetaminophen (TYLENOL) tablet 650 mg  650 mg Oral Q6H PRN Demetrios Loll, MD       Or  . acetaminophen (TYLENOL) suppository 650 mg  650 mg Rectal Q6H PRN Demetrios Loll, MD      . albuterol (PROVENTIL) (2.5 MG/3ML) 0.083% nebulizer solution 2.5 mg  2.5 mg Nebulization Q2H PRN Demetrios Loll, MD      . bisacodyl (DULCOLAX) suppository 10 mg  10 mg Rectal Daily PRN Demetrios Loll, MD      . cefTRIAXone (ROCEPHIN) 1 g in dextrose 5 % 50 mL IVPB  1 g Intravenous Q24H Vaughan Basta, MD      . docusate sodium (COLACE) capsule 100 mg  100 mg Oral Daily Demetrios Loll, MD      . enoxaparin (LOVENOX) injection 40 mg  40 mg Subcutaneous Q24H Demetrios Loll, MD   40 mg at 05/15/16 2350  . insulin aspart (novoLOG) injection 0-5 Units  0-5 Units Subcutaneous QHS Demetrios Loll, MD      . insulin aspart (novoLOG) injection 0-9 Units  0-9 Units Subcutaneous TID WC Demetrios Loll, MD      . ondansetron [facility]) tablet 4 mg  4 mg Oral Q6H PRN Demetrios Loll, MD       Or  . ondansetron [facility]) injection 4 mg  4 mg Intravenous  Q6H PRN Demetrios Loll, MD      . polyethylene glycol (MIRALAX / GLYCOLAX) packet 17 g  17 g Oral Daily Demetrios Loll, MD      . QUEtiapine (SEROQUEL) tablet 25 mg  25 mg Oral QHS Demetrios Loll, MD   25 mg at 05/16/16 0114  . senna-docusate (Senokot-S) tablet 1 tablet  1 tablet Oral QHS PRN Demetrios Loll, MD      . sodium chloride flush (NS) 0.9 % injection 3 mL  3 mL Intravenous Q12H Demetrios Loll, MD         Discharge Medications: Current Discharge Medication List        START taking these medications   Details  cephALEXin (KEFLEX) 500 MG capsule Take 1 capsule (500 mg total) by mouth 3 (three) times daily. Qty: 15 capsule, Refills: 0          CONTINUE these medications which have NOT CHANGED   Details  acetaminophen (TYLENOL) 325 MG tablet Take 2 tablets (650 mg total) by mouth every 6 (six) hours as  needed for mild pain, fever or headache (or Fever >/= 100.5). Qty: 30 tablet, Refills: 0    bisacodyl (DULCOLAX) 10 MG suppository Place 1 suppository (10 mg total) rectally daily as needed for moderate constipation. Qty: 12 suppository, Refills: 0    docusate sodium (COLACE) 100 MG capsule Take 100 mg by mouth daily.    nystatin cream (MYCOSTATIN) Apply 1 application topically 3 (three) times daily.    polyethylene glycol (MIRALAX / GLYCOLAX) packet Take 17 g by mouth daily.         STOP taking these medications     hydrochlorothiazide (HYDRODIURIL) 12.5 MG tablet      QUEtiapine (SEROQUEL) 25 MG tablet      nystatin (MYCOSTATIN/NYSTOP) powder          Relevant Imaging Results:  Relevant Lab Results:   Additional Information  (SSN: [id number])  Sample, [name], LCSW

## 2016-05-16 NOTE — Progress Notes (Signed)
[facility] [facility] [place] NAME: Melinda Hughes    MR#:  [id number]  DATE OF BIRTH:  1931/07/09  SUBJECTIVE:  CHIEF COMPLAINT:   Chief Complaint  Patient presents with  . Fall    Brought from her living facility with altered mental status. Found to have UTI, remains lethargic, hemodynamically stable with IV fluids.  REVIEW OF SYSTEMS:  Patient is lethargic and not able to give review of system. ROS  DRUG ALLERGIES:   Allergies  Allergen Reactions  . Codeine Itching  . Fluzone [Flu Virus Vaccine]     Unknown reaction. Received info from nursing home  . Morphine And Related Itching  . Sulfa Antibiotics Itching    VITALS:  Blood pressure (!) 147/48, pulse 92, temperature 98.2 F (36.8 C), temperature source Oral, resp. rate 18, height 5\' 5"  (1.651 m), weight 72.6 kg (160 lb), SpO2 97 %.  PHYSICAL EXAMINATION:  GENERAL:  83 y.o.-year-old patient lying in the bed with no acute distress. Lethargic. EYES: Pupils equal, round, reactive to light and accommodation. No scleral icterus.  HEENT: Head atraumatic, normocephalic. Oropharynx and nasopharynx clear.  NECK:  Supple, no jugular venous distention. No thyroid enlargement, no tenderness.  LUNGS: Normal breath sounds bilaterally, no wheezing, rales,rhonchi or crepitation. No use of accessory muscles of respiration.  CARDIOVASCULAR: S1, S2 normal. No murmurs, rubs, or gallops.  ABDOMEN: Soft, nontender, nondistended. Bowel sounds present. No organomegaly or mass.  EXTREMITIES: No pedal edema, cyanosis, or clubbing.  NEUROLOGIC: Patient is lethargic, she responded by morning to painful stimuli and some retraction of the limbs but does not open her eyes or communicate with me.  PSYCHIATRIC: The patient is lethargic.  SKIN: No obvious rash, lesion, or ulcer.   Physical Exam LABORATORY PANEL:   CBC  Recent Labs Lab 05/16/16 0118  WBC 11.1*  HGB 9.4*  HCT 27.8*  PLT 317    ------------------------------------------------------------------------------------------------------------------  Chemistries   Recent Labs Lab 05/16/16 0116 05/16/16 0118  NA  --  143  K  --  2.9*  CL  --  107  CO2  --  26  GLUCOSE  --  143*  BUN  --  19  CREATININE  --  0.79  CALCIUM  --  8.2*  MG 1.9  --    ------------------------------------------------------------------------------------------------------------------  Cardiac Enzymes No results for input(s): TROPONINI in the last 168 hours. ------------------------------------------------------------------------------------------------------------------  RADIOLOGY:  Ct Head Wo Contrast  Result Date: 05/15/2016 CLINICAL DATA:  Fall, dementia EXAM: CT HEAD WITHOUT CONTRAST TECHNIQUE: Contiguous axial images were obtained from the base of the skull through the vertex without intravenous contrast. COMPARISON:  05/06/2016 FINDINGS: Brain: There is atrophy and chronic small vessel disease changes. No acute intracranial abnormality. Specifically, no hemorrhage, hydrocephalus, mass lesion, acute infarction, or significant intracranial injury. Vascular: No hyperdense vessel or unexpected calcification. Skull: No acute calvarial abnormality. Sinuses/Orbits: Mild mucosal thickening in the paranasal sinuses. Mastoid air cells are clear. Other: None IMPRESSION: No acute intracranial abnormality. Atrophy, chronic microvascular disease. Electronically Signed   By: Rolm Baptise M.D.   On: 05/15/2016 17:32    ASSESSMENT AND PLAN:   Active Problems:   Sepsis ([place])   * Sepsis due to UTI   Urine culture and blood cultures are sent   Blood pressure is now stable, continue IV fluids.   IV ceftriaxone, follow culture results.  * Acute encephalopathy   This is secondary to her sepsis and infection   There is baseline dementia as  per the admission notes, we'll continue monitoring her improvement with treatment.  * Hypertension   Hold  home medications at this point because of sepsis.  * Hypokalemia   Continue IV fluids with potassium.  * Diabetes   Sliding scale coverage.    All the records are reviewed and case discussed with Care Management/Social Workerr. Management plans discussed with the patient, family and they are in agreement.  CODE STATUS: Full code.  TOTAL TIME TAKING CARE OF THIS PATIENT: 34 minutes.     POSSIBLE D/C IN 1-2 DAYS, DEPENDING ON CLINICAL CONDITION.   Vaughan Basta M.D on 05/16/2016   Between 7am to 6pm - Pager - [phone]  After 6pm go to [email or web address] - password [email or web address]  Office  [phone]  CC: Primary care physician; Kirk Ruths., MD  Note: This dictation was prepared with Dragon dictation along with smaller phrase technology. Any transcriptional errors that result from this process are unintentional.

## 2016-05-16 NOTE — Progress Notes (Signed)
Pt pulling off telemetry, MD Vachhani notified orders received to discontinue tele. Orders placed.

## 2016-05-16 NOTE — Progress Notes (Signed)
Palliative Medicine Team  Due to high volume of referrals, there is a delay seeing this patient. PMT not at [facility] over the weekend but will arrange goals of care with patient and family on Monday. Thank you for the opportunity to participate in the care of Ms. Hartnell.   NO CHARGE  Ihor Dow, FNP-C Palliative Medicine Team  Phone: [phone] Fax: [phone]

## 2016-05-16 NOTE — Progress Notes (Signed)
Pts potasium 2.9. MD Anselm Jungling notified, MD placing orders.

## 2016-05-17 LAB — BASIC METABOLIC PANEL
Anion gap: 7 (ref 5–15)
BUN: 11 mg/dL (ref 6–20)
CALCIUM: 8.8 mg/dL — AB (ref 8.9–10.3)
CO2: 30 mmol/L (ref 22–32)
CREATININE: 0.86 mg/dL (ref 0.44–1.00)
Chloride: 105 mmol/L (ref 101–111)
GFR calc non Af Amer: >60 mL/min (ref >60)
Glucose, Bld: 126 mg/dL — ABNORMAL HIGH (ref 65–99)
Potassium: 3.7 mmol/L (ref 3.5–5.1)
SODIUM: 142 mmol/L (ref 135–145)

## 2016-05-17 LAB — GLUCOSE, CAPILLARY
GLUCOSE-CAPILLARY: 115 mg/dL — AB (ref 65–99)
GLUCOSE-CAPILLARY: 148 mg/dL — AB (ref 65–99)
Glucose-Capillary: 111 mg/dL — ABNORMAL HIGH (ref 65–99)
Glucose-Capillary: 105 mg/dL — ABNORMAL HIGH (ref 65–99)

## 2016-05-17 LAB — MAGNESIUM: MAGNESIUM: 2 mg/dL (ref 1.7–2.4)

## 2016-05-17 MED ORDER — QUETIAPINE FUMARATE 25 MG PO TABS: 12.5000 mg | ORAL_TABLET | Freq: Every day | ORAL | Status: DC

## 2016-05-17 NOTE — Progress Notes (Signed)
Patient ID: Melinda Hughes, female   DOB: [birth year], 83 y.o.   MRN: [id number]  ACP note  Patient unable to participate in conversation secondary to dementia. Spoke with daughter at the bedside and husband on the phone.  Discussion of CODE STATUS and overall prognosis with dementia. Husband would like the patient around as long as possible. He would like her to be a full code. Daughter was thinking that he did not want the patient on a breathing machine. Long-term he does not want her on a breathing machine but short-term okay to do.  Time spent on ACP discussion 17 minutes  Dr. Leslye Peer

## 2016-05-17 NOTE — Progress Notes (Signed)
Patient ID: Melinda Hughes, female   DOB: [birth year], 83 y.o.   MRN: [id number]  [employer] PROGRESS NOTE  Melinda Hughes DOB: 1931-07-27 DOA: 05/15/2016 PCP: Kirk Ruths., MD  HPI/Subjective: Patient with dementia and unable to answer questions. She is talking though. Heart tones understand. Husband thinks that she is overmedicated and he would like her off the Seroquel. He thinks the falling is secondary to the medication.  Objective: Vitals:   05/17/16 0601 05/17/16 0731  BP: (!) 178/76 (!) 154/73  Pulse: 82 100  Resp: 18 18  Temp: 97.4 F (36.3 C) 97.7 F (36.5 C)    Filed Weights   05/15/16 1716  Weight: 72.6 kg (160 lb)    ROS: Review of Systems  Unable to perform ROS: Dementia   Exam: Physical Exam  HENT:  Nose: No mucosal edema.  Mouth/Throat: No oropharyngeal exudate or posterior oropharyngeal edema.  Eyes: Conjunctivae and lids are normal. Pupils are equal, round, and reactive to light.  Neck: No JVD present. Carotid bruit is not present. No edema present. No thyroid mass and no thyromegaly present.  Cardiovascular: S1 normal and S2 normal.  Exam reveals no gallop.   No murmur heard. Pulses:      Dorsalis pedis pulses are 2+ on the right side, and 2+ on the left side.  Respiratory: No respiratory distress. She has no wheezes. She has no rhonchi. She has no rales.  GI: Soft. Bowel sounds are normal. There is no tenderness.  Musculoskeletal:       Right ankle: She exhibits no swelling.       Left ankle: She exhibits no swelling.  Lymphadenopathy:    She has no cervical adenopathy.  Neurological: She is alert.  Skin: Skin is warm. No rash noted. Nails show no clubbing.  Psychiatric: She has a normal mood and affect.      Data Reviewed: Basic Metabolic Panel:  Recent Labs Lab 05/15/16 1729 05/16/16 0116 05/16/16 0118 05/17/16 0733  NA 144  --  143 142  K 3.6  --  2.9* 3.7  CL 101  --  107 105  CO2 30  --  26 30  GLUCOSE 164*  --   143* 126*  BUN 23*  --  19 11  CREATININE 1.13*  --  0.79 0.86  CALCIUM 8.9  --  8.2* 8.8*  MG  --  1.9  --  2.0   CBC:  Recent Labs Lab 05/15/16 1729 05/16/16 0118  WBC 14.6* 11.1*  NEUTROABS 13.0*  --   HGB 10.6* 9.4*  HCT 31.8* 27.8*  MCV 86.4 84.0  PLT 383 317    CBG:  Recent Labs Lab 05/16/16 0753 05/16/16 1134 05/16/16 1650 05/16/16 2059 05/17/16 0740  GLUCAP 119* 122* 96 166* 111*    Recent Results (from the past 240 hour(s))  Urine culture     Status: Abnormal (Preliminary result)   Collection Time: 05/15/16  5:29 PM  Result Value Ref Range Status   Specimen Description URINE, RANDOM  Final   Special Requests NONE  Final   Culture (A)  Final    >=100,000 COLONIES/mL ESCHERICHIA COLI SUSCEPTIBILITIES TO FOLLOW Performed at [facility], [place] [address] [place], [place] [address]    Report Status PENDING  Incomplete  MRSA PCR Screening     Status: None   Collection Time: 05/15/16  9:30 PM  Result Value Ref Range Status   MRSA by PCR NEGATIVE NEGATIVE Final    Comment:  The GeneXpert MRSA Assay (FDA approved for NASAL specimens only), is one component of a comprehensive MRSA colonization surveillance program. It is not intended to diagnose MRSA infection nor to guide or monitor treatment for MRSA infections.   Culture, blood (x 2)     Status: None (Preliminary result)   Collection Time: 05/16/16  1:17 AM  Result Value Ref Range Status   Specimen Description BLOOD  L HAND  Final   Special Requests   Final    BOTTLES DRAWN AEROBIC AND ANAEROBIC  ADEQUATE VOLUME    Culture NO GROWTH 1 DAY  Final   Report Status PENDING  Incomplete  Culture, blood (x 2)     Status: None (Preliminary result)   Collection Time: 05/16/16  1:18 AM  Result Value Ref Range Status   Specimen Description BLOOD  L WRIST  Final   Special Requests BOTTLES DRAWN AEROBIC AND ANAEROBIC  LOW VOLUME  Final   Culture NO GROWTH 1 DAY  Final   Report Status PENDING   Incomplete     Studies: Ct Head Wo Contrast  Result Date: 05/15/2016 CLINICAL DATA:  Fall, dementia EXAM: CT HEAD WITHOUT CONTRAST TECHNIQUE: Contiguous axial images were obtained from the base of the skull through the vertex without intravenous contrast. COMPARISON:  05/06/2016 FINDINGS: Brain: There is atrophy and chronic small vessel disease changes. No acute intracranial abnormality. Specifically, no hemorrhage, hydrocephalus, mass lesion, acute infarction, or significant intracranial injury. Vascular: No hyperdense vessel or unexpected calcification. Skull: No acute calvarial abnormality. Sinuses/Orbits: Mild mucosal thickening in the paranasal sinuses. Mastoid air cells are clear. Other: None IMPRESSION: No acute intracranial abnormality. Atrophy, chronic microvascular disease. Electronically Signed   By: Rolm Baptise M.D.   On: 05/15/2016 17:32    Scheduled Meds: . cefTRIAXone (ROCEPHIN) IVPB 1 gram/50 mL D5W  1 g Intravenous Q24H  . docusate sodium  100 mg Oral Daily  . enoxaparin (LOVENOX) injection  40 mg Subcutaneous Q24H  . insulin aspart  0-5 Units Subcutaneous QHS  . insulin aspart  0-9 Units Subcutaneous TID WC  . polyethylene glycol  17 g Oral Daily  . sodium chloride flush  3 mL Intravenous Q12H    Assessment/Plan:  1. Clinical sepsis secondary to acute cystitis with hematuria. Urine culture growing Escherichia coli. Still waiting for the sensitivities. Continue Rocephin. 2. Frequent falls. Husband thinks the patient is overmedicated and once the Seroquel stopped. We'll try this. 3. Weakness. Physical therapy evaluation 4. Dementia with behavioral disturbance. Monitor closely. 5. History of hypertension. Hold hydrochlorothiazide secondary to hypokalemia 6. Hypokalemia replaced  Code Status:     Code Status Orders        Start     Ordered   05/15/16 2129  Full code  Continuous     05/15/16 2128    Code Status History    Date Active Date Inactive Code Status  Order ID Comments User Context   10/17/2015  5:55 PM 10/19/2015  5:37 PM DNR [id number]  Loletha Grayer, MD ED     Family Communication: Daughter at the bedside, husband on the phone Disposition Plan: To be determined  Antibiotics: - Rocephin  Time spent: 35 minutes including a ACP time  Loletha Grayer  [employer]

## 2016-05-17 NOTE — Progress Notes (Signed)
PT Cancellation Note  Patient Details Name: Stephanny Hoctor MRN: [id number] DOB: 06/14/1931   Cancelled Treatment:    Reason Eval/Treat Not Completed: Other (comment). Evaluation re-attempted this date. Pt awake, however very confused and unable to follow direct commands for there-ex or bed mobility. Pt withdrawals foot when therapist attempts to don socks. Not able to participate in therapy this date. Will re-attempt one more time next date.   Joanny Dupree 05/17/2016, 9:01 AM  Greggory Stallion, PT, DPT [phone]

## 2016-05-18 LAB — GLUCOSE, CAPILLARY
Glucose-Capillary: 102 mg/dL — ABNORMAL HIGH (ref 65–99)
Glucose-Capillary: 149 mg/dL — ABNORMAL HIGH (ref 65–99)

## 2016-05-18 LAB — URINE CULTURE

## 2016-05-18 MED ORDER — CEPHALEXIN 500 MG PO CAPS: 500.0000 mg | ORAL_CAPSULE | Freq: Three times a day (TID) | ORAL | 0 refills | Status: DC

## 2016-05-18 NOTE — Progress Notes (Signed)
PT Cancellation Note  Patient Details Name: Melinda Hughes MRN: [id number] DOB: 1931-12-16   Cancelled Treatment:    Reason Eval/Treat Not Completed: Other (comment). 3rd attempt for evaluation. Pt much more alert this date, however unable to follow direct commands (lifting legs or ankle pumps). Pt withdrawals when therapist attempts to assist her, becoming slightly agitated. Pt not able to cooperate with therapy at this time secondary to dementia and talks off topic. Will cancel current orders. Recommend return back to LTC with therapy attempts as pt willing/able to participate.   Cyani Kallstrom 05/18/2016, 10:35 AM  Greggory Stallion, PT, DPT [phone]

## 2016-05-18 NOTE — Progress Notes (Addendum)
Report called to [place] at [facility]. Daughter will transport pt to facility

## 2016-05-18 NOTE — Clinical Social Work Note (Signed)
Patient to dc to [facility] today via family transport by her daughter Melinda Hughes. The facility is aware and all documentation has been sent. Packet to be delivered to the unit once FL 2 is signed. CSW will con't to follow pending any additional dc needs.  Santiago Bumpers, MSW, Latanya Presser [phone]

## 2016-05-18 NOTE — Discharge Summary (Signed)
[place] at [place] NAME: Melinda Hughes    MR#:  [id number]  DATE OF BIRTH:  07/20/1931  DATE OF ADMISSION:  05/15/2016 ADMITTING PHYSICIAN: Demetrios Loll, MD  DATE OF DISCHARGE: 05/18/2016  PRIMARY CARE PHYSICIAN: Kirk Ruths., MD    ADMISSION DIAGNOSIS:  Lower urinary tract infectious disease [N39.0] Fall, initial encounter [W19.XXXA]  DISCHARGE DIAGNOSIS:  Active Problems:   Sepsis ([place])   SECONDARY DIAGNOSIS:   Past Medical History:  Diagnosis Date  . Arthritis   . Dementia   . Diabetes mellitus without complication ([place])   . Hypertension   . Uterine cancer [facility])     HOSPITAL COURSE:   1. Clinical sepsis with acute cystitis with hematuria. Urine culture growing Escherichia coli. It is sensitive to the Rocephin that I had her on in the hospital. We will discharge back to her facility with Keflex. 2. Frequent falls. In speaking with the patient's husband, he thinks the patient is overmedicated. I stopped Seroquel and hydrochlorothiazide. Patient will be a high risk for falls if she does not use a walker. Rolling walker prescribed. Physical therapy had a hard time walking with the patient. Nursing staff and aide did walker out into the hallway. The patient will always be a high fall risk secondary to dementia and her not walking with a walker at the facility. Recommend walking with a walker. Try to set up home health physical therapy. 3. Weakness. Patient did walk in the hallway with nursing staff 4. Dementia with behavioral disturbance. Monitor closely off Seroquel. 5. History of hypertension blood pressure is okay off the hydrochlorothiazide. I would rather have the patient's blood pressure on the higher side. 6. Hypokalemia this was replaced during the hospital course. Hydrochlorothiazide is not a good medication for this patient secondary to the hypokalemia   DISCHARGE CONDITIONS:   Satisfactory  CONSULTS OBTAINED:   None  DRUG ALLERGIES:   Allergies  Allergen Reactions  . Codeine Itching  . Fluzone [Flu Virus Vaccine]     Unknown reaction. Received info from nursing home  . Morphine And Related Itching  . Sulfa Antibiotics Itching    DISCHARGE MEDICATIONS:   Current Discharge Medication List    START taking these medications   Details  cephALEXin (KEFLEX) 500 MG capsule Take 1 capsule (500 mg total) by mouth 3 (three) times daily. Qty: 15 capsule, Refills: 0      CONTINUE these medications which have NOT CHANGED   Details  acetaminophen (TYLENOL) 325 MG tablet Take 2 tablets (650 mg total) by mouth every 6 (six) hours as needed for mild pain, fever or headache (or Fever >/= 100.5). Qty: 30 tablet, Refills: 0    bisacodyl (DULCOLAX) 10 MG suppository Place 1 suppository (10 mg total) rectally daily as needed for moderate constipation. Qty: 12 suppository, Refills: 0    docusate sodium (COLACE) 100 MG capsule Take 100 mg by mouth daily.    nystatin cream (MYCOSTATIN) Apply 1 application topically 3 (three) times daily.    polyethylene glycol (MIRALAX / GLYCOLAX) packet Take 17 g by mouth daily.      STOP taking these medications     hydrochlorothiazide (HYDRODIURIL) 12.5 MG tablet      QUEtiapine (SEROQUEL) 25 MG tablet      nystatin (MYCOSTATIN/NYSTOP) powder          DISCHARGE INSTRUCTIONS:   Follow-up with PMD one week  If you experience worsening of your admission symptoms, develop shortness of breath, life  threatening emergency, suicidal or homicidal thoughts you must seek medical attention immediately by calling 911 or calling your MD immediately  if symptoms less severe.  You Must read complete instructions/literature along with all the possible adverse reactions/side effects for all the Medicines you take and that have been prescribed to you. Take any new Medicines after you have completely understood and accept all the possible adverse reactions/side effects.    Please note  You were cared for by a hospitalist during your hospital stay. If you have any questions about your discharge medications or the care you received while you were in the hospital after you are discharged, you can call the unit and asked to speak with the hospitalist on call if the hospitalist that took care of you is not available. Once you are discharged, your primary care physician will handle any further medical issues. Please note that NO REFILLS for any discharge medications will be authorized once you are discharged, as it is imperative that you return to your primary care physician (or establish a relationship with a primary care physician if you do not have one) for your aftercare needs so that they can reassess your need for medications and monitor your lab values.    Today   CHIEF COMPLAINT:   Chief Complaint  Patient presents with  . Fall    HISTORY OF PRESENT ILLNESS:  Melinda Hughes  is a 83 y.o. female with a known history of Dementia presented after a fall. Found to have a urinary tract infection.   VITAL SIGNS:  Blood pressure (!) 143/67, pulse 66, temperature 97.7 F (36.5 C), temperature source Oral, resp. rate 18, height 5\' 5"  (1.651 m), weight 72.6 kg (160 lb), SpO2 99 %.    PHYSICAL EXAMINATION:  GENERAL:  83 y.o.-year-old patient lying in the bed with no acute distress.  EYES: Pupils equal, round, reactive to light and accommodation. No scleral icterus.  HEENT: Head atraumatic, normocephalic. Oropharynx and nasopharynx clear.  NECK:  Supple, no jugular venous distention. No thyroid enlargement, no tenderness.  LUNGS: Normal breath sounds bilaterally, no wheezing, rales,rhonchi or crepitation. No use of accessory muscles of respiration.  CARDIOVASCULAR: S1, S2 normal. No murmurs, rubs, or gallops.  ABDOMEN: Soft, non-tender, non-distended. Bowel sounds present. No organomegaly or mass.  EXTREMITIES: No pedal edema, cyanosis, or clubbing.  NEUROLOGIC:  Cranial nerves II through XII are intact.  Sensation intact. Gait wide-based with nursing staff. PSYCHIATRIC: The patient is alert.  SKIN: No obvious rash, lesion, or ulcer.   DATA REVIEW:   CBC  Recent Labs Lab 05/16/16 0118  WBC 11.1*  HGB 9.4*  HCT 27.8*  PLT 317    Chemistries   Recent Labs Lab 05/17/16 0733  NA 142  K 3.7  CL 105  CO2 30  GLUCOSE 126*  BUN 11  CREATININE 0.86  CALCIUM 8.8*  MG 2.0     Microbiology Results  Results for orders placed or performed during the hospital encounter of 05/15/16  Urine culture     Status: Abnormal   Collection Time: 05/15/16  5:29 PM  Result Value Ref Range Status   Specimen Description URINE, RANDOM  Final   Special Requests NONE  Final   Culture >=100,000 COLONIES/mL ESCHERICHIA COLI (A)  Final   Report Status 05/18/2016 FINAL  Final   Organism ID, Bacteria ESCHERICHIA COLI (A)  Final      Susceptibility   Escherichia coli - MIC*    AMPICILLIN >=32 RESISTANT Resistant  CEFAZOLIN <=4 SENSITIVE Sensitive     CEFTRIAXONE <=1 SENSITIVE Sensitive     CIPROFLOXACIN <=0.25 SENSITIVE Sensitive     GENTAMICIN <=1 SENSITIVE Sensitive     IMIPENEM <=0.25 SENSITIVE Sensitive     NITROFURANTOIN <=16 SENSITIVE Sensitive     TRIMETH/SULFA <=20 SENSITIVE Sensitive     AMPICILLIN/SULBACTAM >=32 RESISTANT Resistant     PIP/TAZO <=4 SENSITIVE Sensitive     Extended ESBL NEGATIVE Sensitive     * >=100,000 COLONIES/mL ESCHERICHIA COLI  MRSA PCR Screening     Status: None   Collection Time: 05/15/16  9:30 PM  Result Value Ref Range Status   MRSA by PCR NEGATIVE NEGATIVE Final    Comment:        The GeneXpert MRSA Assay (FDA approved for NASAL specimens only), is one component of a comprehensive MRSA colonization surveillance program. It is not intended to diagnose MRSA infection nor to guide or monitor treatment for MRSA infections.   Culture, blood (x 2)     Status: None (Preliminary result)   Collection Time:  05/16/16  1:17 AM  Result Value Ref Range Status   Specimen Description BLOOD  L HAND  Final   Special Requests   Final    BOTTLES DRAWN AEROBIC AND ANAEROBIC  ADEQUATE VOLUME    Culture NO GROWTH 2 DAYS  Final   Report Status PENDING  Incomplete  Culture, blood (x 2)     Status: None (Preliminary result)   Collection Time: 05/16/16  1:18 AM  Result Value Ref Range Status   Specimen Description BLOOD  L WRIST  Final   Special Requests BOTTLES DRAWN AEROBIC AND ANAEROBIC  LOW VOLUME  Final   Culture NO GROWTH 2 DAYS  Final   Report Status PENDING  Incomplete       Management plans discussed with the patient, family and they are in agreement.  CODE STATUS:     Code Status Orders        Start     Ordered   05/15/16 2129  Full code  Continuous     05/15/16 2128    Code Status History    Date Active Date Inactive Code Status Order ID Comments User Context   10/17/2015  5:55 PM 10/19/2015  5:37 PM DNR [id number]  Loletha Grayer, MD ED      TOTAL TIME TAKING CARE OF THIS PATIENT: 35 minutes.    Loletha Grayer M.D on 05/18/2016 at 11:03 AM  Between 7am to 6pm - Pager - [phone]  After 6pm go to [email or web address] - password [email or web address]  [employer] Office  [phone]  CC: Primary care physician; Kirk Ruths., MD

## 2016-05-18 NOTE — Discharge Instructions (Signed)

## 2016-05-18 NOTE — Progress Notes (Signed)
Ambulated pt in hall with 2 person assist. Gait was unsteady

## 2016-05-21 LAB — CULTURE, BLOOD (ROUTINE X 2)
CULTURE: NO GROWTH
Culture: NO GROWTH
SPECIAL REQUESTS: ADEQUATE

## 2016-07-21 ENCOUNTER — Emergency Department: Payer: Medicare Other

## 2016-07-21 ENCOUNTER — Emergency Department
Admission: EM | Admit: 2016-07-21 | Discharge: 2016-07-21 | Disposition: A | Discharge status: Home / Self Care | Payer: Medicare Other | Attending: Emergency Medicine | Admitting: Emergency Medicine

## 2016-07-21 ENCOUNTER — Encounter: Payer: Self-pay | Admitting: Emergency Medicine

## 2016-07-21 DIAGNOSIS — Y939 Activity, unspecified: Secondary | ICD-10-CM | POA: Insufficient documentation

## 2016-07-21 DIAGNOSIS — Y92129 Unspecified place in nursing home as the place of occurrence of the external cause: Secondary | ICD-10-CM | POA: Diagnosis not present

## 2016-07-21 DIAGNOSIS — W19XXXA Unspecified fall, initial encounter: Secondary | ICD-10-CM | POA: Insufficient documentation

## 2016-07-21 DIAGNOSIS — Z8542 Personal history of malignant neoplasm of other parts of uterus: Secondary | ICD-10-CM | POA: Diagnosis not present

## 2016-07-21 DIAGNOSIS — I10 Essential (primary) hypertension: Secondary | ICD-10-CM | POA: Diagnosis not present

## 2016-07-21 DIAGNOSIS — Z79899 Other long term (current) drug therapy: Secondary | ICD-10-CM | POA: Diagnosis not present

## 2016-07-21 DIAGNOSIS — E119 Type 2 diabetes mellitus without complications: Secondary | ICD-10-CM | POA: Diagnosis not present

## 2016-07-21 DIAGNOSIS — F0390 Unspecified dementia without behavioral disturbance: Secondary | ICD-10-CM | POA: Diagnosis not present

## 2016-07-21 DIAGNOSIS — Z043 Encounter for examination and observation following other accident: Secondary | ICD-10-CM | POA: Insufficient documentation

## 2016-07-21 DIAGNOSIS — Y999 Unspecified external cause status: Secondary | ICD-10-CM | POA: Insufficient documentation

## 2016-07-21 DIAGNOSIS — S0990XA Unspecified injury of head, initial encounter: Secondary | ICD-10-CM | POA: Diagnosis not present

## 2016-07-21 LAB — CBC WITH DIFFERENTIAL/PLATELET
BASOS ABS: 0 10*3/uL (ref 0–0.1)
Basophils Relative: 1 %
EOS ABS: 0.1 10*3/uL (ref 0–0.7)
EOS PCT: 1 %
HCT: 36.9 % (ref 35.0–47.0)
Hemoglobin: 12.3 g/dL (ref 12.0–16.0)
LYMPHS PCT: 34 %
Lymphs Abs: 1.8 10*3/uL (ref 1.0–3.6)
MCH: 28.7 pg (ref 26.0–34.0)
MCHC: 33.3 g/dL (ref 32.0–36.0)
MCV: 85.9 fL (ref 80.0–100.0)
MONO ABS: 0.4 10*3/uL (ref 0.2–0.9)
Monocytes Relative: 7 %
Neutro Abs: 3 10*3/uL (ref 1.4–6.5)
Neutrophils Relative %: 57 %
PLATELETS: 209 10*3/uL (ref 150–440)
RBC: 4.3 MIL/uL (ref 3.80–5.20)
RDW: 15.9 % — AB (ref 11.5–14.5)
WBC: 5.3 10*3/uL (ref 3.6–11.0)

## 2016-07-21 LAB — URINALYSIS, COMPLETE (UACMP) WITH MICROSCOPIC
Bacteria, UA: NONE SEEN
Bilirubin Urine: NEGATIVE
GLUCOSE, UA: NEGATIVE mg/dL
Hgb urine dipstick: NEGATIVE
Ketones, ur: NEGATIVE mg/dL
LEUKOCYTES UA: NEGATIVE
Nitrite: NEGATIVE
PH: 7 (ref 5.0–8.0)
Protein, ur: NEGATIVE mg/dL
Specific Gravity, Urine: 1.014 (ref 1.005–1.030)

## 2016-07-21 LAB — COMPREHENSIVE METABOLIC PANEL
ALT: 13 U/L — AB (ref 14–54)
AST: 20 U/L (ref 15–41)
Albumin: 4.3 g/dL (ref 3.5–5.0)
Alkaline Phosphatase: 87 U/L (ref 38–126)
Anion gap: 7 (ref 5–15)
BILIRUBIN TOTAL: 0.5 mg/dL (ref 0.3–1.2)
BUN: 26 mg/dL — ABNORMAL HIGH (ref 6–20)
CO2: 28 mmol/L (ref 22–32)
Calcium: 9.6 mg/dL (ref 8.9–10.3)
Chloride: 106 mmol/L (ref 101–111)
Creatinine, Ser: 0.74 mg/dL (ref 0.44–1.00)
GFR calc Af Amer: >60 mL/min (ref >60)
Glucose, Bld: 106 mg/dL — ABNORMAL HIGH (ref 65–99)
Potassium: 4 mmol/L (ref 3.5–5.1)
Sodium: 141 mmol/L (ref 135–145)
Total Protein: 7.9 g/dL (ref 6.5–8.1)

## 2016-07-21 LAB — TROPONIN I

## 2016-07-21 NOTE — ED Triage Notes (Signed)
Pt comes into the ED via EMS from [facility] for an unwitnessed fall. Patient denying any pain at this time.  No notable injuries to the patient.  Patient has even and unlabored respirations.  All VS stable with EMS.  Patient has h/o dementia and resides in the memory care unit at [facility].

## 2016-07-21 NOTE — Discharge Instructions (Signed)
Please follow-up with your primary care physician in 2 days for recheck. Return to the emergency department sooner for any new or worsening symptoms such as chest pain, shortness of breath, fevers, chills, or for any other concerns.  It was a pleasure to take care of you today, and thank you for coming to our emergency department.  If you have any questions or concerns before leaving please ask the nurse to grab me and I'm more than happy to go through your aftercare instructions again.  If you were prescribed any opioid pain medication today such as Norco, Vicodin, Percocet, morphine, hydrocodone, or oxycodone please make sure you do not drive when you are taking this medication as it can alter your ability to drive safely.  If you have any concerns once you are home that you are not improving or are in fact getting worse before you can make it to your follow-up appointment, please do not hesitate to call 911 and come back for further evaluation.  Darel Hong MD  Results for orders placed or performed during the hospital encounter of 07/21/16  CBC with Differential  Result Value Ref Range   WBC 5.3 3.6 - 11.0 K/uL   RBC 4.30 3.80 - 5.20 MIL/uL   Hemoglobin 12.3 12.0 - 16.0 g/dL   HCT 36.9 35.0 - 47.0 %   MCV 85.9 80.0 - 100.0 fL   MCH 28.7 26.0 - 34.0 pg   MCHC 33.3 32.0 - 36.0 g/dL   RDW 15.9 (H) 11.5 - 14.5 %   Platelets 209 150 - 440 K/uL   Neutrophils Relative % 57 %   Neutro Abs 3.0 1.4 - 6.5 K/uL   Lymphocytes Relative 34 %   Lymphs Abs 1.8 1.0 - 3.6 K/uL   Monocytes Relative 7 %   Monocytes Absolute 0.4 0.2 - 0.9 K/uL   Eosinophils Relative 1 %   Eosinophils Absolute 0.1 0 - 0.7 K/uL   Basophils Relative 1 %   Basophils Absolute 0.0 0 - 0.1 K/uL  Urinalysis, Complete w Microscopic  Result Value Ref Range   Color, Urine STRAW (A) YELLOW   APPearance CLEAR (A) CLEAR   Specific Gravity, Urine 1.014 1.005 - 1.030   pH 7.0 5.0 - 8.0   Glucose, UA NEGATIVE NEGATIVE mg/dL   Hgb urine dipstick NEGATIVE NEGATIVE   Bilirubin Urine NEGATIVE NEGATIVE   Ketones, ur NEGATIVE NEGATIVE mg/dL   Protein, ur NEGATIVE NEGATIVE mg/dL   Nitrite NEGATIVE NEGATIVE   Leukocytes, UA NEGATIVE NEGATIVE   RBC / HPF 0-5 0 - 5 RBC/hpf   WBC, UA 0-5 0 - 5 WBC/hpf   Bacteria, UA NONE SEEN NONE SEEN   Squamous Epithelial / LPF 0-5 (A) NONE SEEN  Comprehensive metabolic panel  Result Value Ref Range   Sodium 141 135 - 145 mmol/L   Potassium 4.0 3.5 - 5.1 mmol/L   Chloride 106 101 - 111 mmol/L   CO2 28 22 - 32 mmol/L   Glucose, Bld 106 (H) 65 - 99 mg/dL   BUN 26 (H) 6 - 20 mg/dL   Creatinine, Ser 0.74 0.44 - 1.00 mg/dL   Calcium 9.6 8.9 - 10.3 mg/dL   Total Protein 7.9 6.5 - 8.1 g/dL   Albumin 4.3 3.5 - 5.0 g/dL   AST 20 15 - 41 U/L   ALT 13 (L) 14 - 54 U/L   Alkaline Phosphatase 87 38 - 126 U/L   Total Bilirubin 0.5 0.3 - 1.2 mg/dL   GFR calc  non Af Amer >60 >60 mL/min   GFR calc Af Amer >60 >60 mL/min   Anion gap 7 5 - 15  Troponin I  Result Value Ref Range   Troponin I <0.03 <0.03 ng/mL   Ct Head Wo Contrast  Result Date: 07/21/2016 CLINICAL DATA:  Fall. EXAM: CT HEAD WITHOUT CONTRAST TECHNIQUE: Contiguous axial images were obtained from the base of the skull through the vertex without intravenous contrast. COMPARISON:  05/15/2016 FINDINGS: Brain: There is no evidence for acute hemorrhage, hydrocephalus, mass lesion, or abnormal extra-axial fluid collection. No definite CT evidence for acute infarction. There is no evidence for acute hemorrhage, hydrocephalus, mass lesion, or abnormal extra-axial fluid collection. No definite CT evidence for acute infarction. Diffuse loss of parenchymal volume is consistent with atrophy. Patchy low attenuation in the deep hemispheric and periventricular white matter is nonspecific, but likely reflects chronic microvascular ischemic demyelination. Vascular: No hyperdense vessel or unexpected calcification. Skull: Normal. Negative for fracture or  focal lesion. Sinuses/Orbits: Trace mucosal disease noted left sphenoid sinus. Remaining visualized paranasal sinuses and mastoid air cells are clear. Visualized portions of the globes and intraorbital fat are unremarkable. Other: None. IMPRESSION: 1. Stable exam.  No acute intracranial abnormality. 2. Atrophy with chronic small vessel white matter ischemic disease. Electronically Signed   By: Misty Stanley M.D.   On: 07/21/2016 16:14   Dg Chest Port 1 View  Result Date: 07/21/2016 CLINICAL DATA:  Unwitnessed fall today.  Syncope with headache. EXAM: PORTABLE CHEST 1 VIEW COMPARISON:  10/18/2015. FINDINGS: The heart is mildly enlarged. There is calcification and tortuosity of the aorta. No active infiltrates or failure. No effusion or pneumothorax. Small nodular densities are seen over the lower lung fields, estimated 11 mm LEFT and 4 mm RIGHT. It is unclear if these are pulmonary in origin given the AP portable radiograph. Consider two-view chest for further evaluation when the patient is more stable. IMPRESSION: Small nodular densities overlie the lower lung fields. Recommend two-view chest for further evaluation. No active infiltrates or failure. Mild cardiomegaly. Electronically Signed   By: Staci Righter M.D.   On: 07/21/2016 16:30

## 2016-07-21 NOTE — ED Provider Notes (Signed)
[facility] Emergency Department Provider Note  ____________________________________________   First MD Initiated Contact with Patient 07/21/16 1543     (approximate)  I have reviewed the triage vital signs and the nursing notes.   HISTORY  Chief Complaint Fall  Level V exemption history is limited by the patient's dementia  HPI Melinda Hughes is a 83 y.o. female who comes to the emergency department after being found on the ground in her memory care unit. The patient has no recollection of the events. The patient's daughter is at bedside and says that mom behaving normally. According to report the patient was found lying on the ground but it is unsure whether or not she tripped or passed out. According to report when caretakers arrived she was holding her head crying saying her head hurt.   Past Medical History:  Diagnosis Date  . Arthritis   . Dementia   . Diabetes mellitus without complication ([place])   . Hypertension   . Uterine cancer [facility])     Patient Active Problem List   Diagnosis Date Noted  . Sepsis ([place]) 05/15/2016  . Pressure ulcer 10/18/2015  . Acute encephalopathy 10/17/2015    Past Surgical History:  Procedure Laterality Date  . ABDOMINAL HYSTERECTOMY    . EYE SURGERY    . FRACTURE SURGERY      Prior to Admission medications   Medication Sig Start Date End Date Taking? Authorizing Provider  acetaminophen (TYLENOL) 325 MG tablet Take 2 tablets (650 mg total) by mouth every 6 (six) hours as needed for mild pain, fever or headache (or Fever >/= 100.5). 10/19/15   Gladstone Lighter, MD  bisacodyl (DULCOLAX) 10 MG suppository Place 1 suppository (10 mg total) rectally daily as needed for moderate constipation. 10/19/15   Gladstone Lighter, MD  cephALEXin (KEFLEX) 500 MG capsule Take 1 capsule (500 mg total) by mouth 3 (three) times daily. 05/18/16   Loletha Grayer, MD  docusate sodium (COLACE) 100 MG capsule Take 100 mg by mouth daily.     [provider]  nystatin cream (MYCOSTATIN) Apply 1 application topically 3 (three) times daily.    [provider]  polyethylene glycol (MIRALAX / GLYCOLAX) packet Take 17 g by mouth daily.    [provider]    Allergies Codeine; Fluzone [flu virus vaccine]; Morphine and related; and Sulfa antibiotics  Family History  Problem Relation Age of Onset  . Hypertension Father     Social History Social History  Substance Use Topics  . Smoking status: Never Smoker  . Smokeless tobacco: Never Used  . Alcohol use No    Review of Systems Level V exemption history Limited by the patient's dementia 10-point ROS otherwise negative.  ____________________________________________   PHYSICAL EXAM:  VITAL SIGNS: ED Triage Vitals  Enc Vitals Group     BP 07/21/16 1423 (!) 173/59     Pulse Rate 07/21/16 1423 72     Resp 07/21/16 1423 (!) 21     Temp 07/21/16 1423 97.7 F (36.5 C)     Temp Source 07/21/16 1423 Axillary     SpO2 07/21/16 1423 100 %     Weight 07/21/16 1421 160 lb (72.6 kg)     Height 07/21/16 1421 5\' 5"  (1.651 m)     Head Circumference --      Peak Flow --      Pain Score --      Pain Loc --      Pain Edu? --  Excl. in [place]? --     Constitutional: Confused significant dementia in no acute distress Eyes: PERRL EOMI. Head: Atraumatic. Nose: No congestion/rhinnorhea. Mouth/Throat: No trismus Neck: No stridor.   Cardiovascular: Normal rate, regular rhythm. Grossly normal heart sounds.  Good peripheral circulation. Respiratory: Normal respiratory effort.  No retractions. Lungs CTAB and moving good air Gastrointestinal: Soft nontender Musculoskeletal: No lower extremity edema   Neurologic:   No gross focal neurologic deficits are appreciated. Skin:  Skin is warm, dry and intact. No rash noted. Psychiatric: Profound dementia    ____________________________________________ ____________________________________________   LABS (all  labs ordered are listed, but only abnormal results are displayed)  Labs Reviewed  CBC WITH DIFFERENTIAL/PLATELET - Abnormal; Notable for the following:       Result Value   RDW 15.9 (*)    All other components within normal limits  URINALYSIS, COMPLETE (UACMP) WITH MICROSCOPIC - Abnormal; Notable for the following:    Color, Urine STRAW (*)    APPearance CLEAR (*)    Squamous Epithelial / LPF 0-5 (*)    All other components within normal limits  COMPREHENSIVE METABOLIC PANEL - Abnormal; Notable for the following:    Glucose, Bld 106 (*)    BUN 26 (*)    ALT 13 (*)    All other components within normal limits  TROPONIN I    No evidence of UTI or acute ischemia __________________________________________  EKG  ED ECG REPORT I, Darel Hong, the attending physician, personally viewed and interpreted this ECG.  Date: 07/22/2016 Rate: 71 Rhythm: normal sinus rhythm QRS Axis: Leftward Intervals: normal ST/T Wave abnormalities: New T-wave inversion in aVL Conduction Disturbances: none Narrative Interpretation: Abnormal  ____________________________________________  RADIOLOGY  Head CT and chest x-ray with no acute disease ____________________________________________   PROCEDURES  Procedure(s) performed: no  Procedures  Critical Care performed: no  Observation: no ____________________________________________   INITIAL IMPRESSION / ASSESSMENT AND PLAN / ED COURSE  Pertinent labs & imaging results that were available during my care of the patient were reviewed by me and considered in my medical decision making (see chart for details).  The patient has no obvious signs of trauma and is unclear whether she syncopized or if she tripped. Differential is broad but as she reportedly was holding her head reporting a headache we'll CT her head as well as check labs and a urinalysis.    Urinalysis is negative for acute infection and troponin is negative. I appreciate the  slight T wave inversion in aVL but it is resolved and is of unclear clinical significance especially considering the negative troponin that is greater than 4 hours out from the initial event. At this point the patient is medically stable for discharge back to her nursing home and her daughter understands and agrees the plan.  ____________________________________________   FINAL CLINICAL IMPRESSION(S) / ED DIAGNOSES  Final diagnoses:  Fall, initial encounter      NEW MEDICATIONS STARTED DURING THIS VISIT:  Discharge Medication List as of 07/21/2016  8:50 PM       Note:  This document was prepared using Dragon voice recognition software and may include unintentional dictation errors.     Darel Hong, MD 07/22/16 1544

## 2016-07-26 ENCOUNTER — Encounter: Payer: Self-pay | Admitting: Emergency Medicine

## 2016-07-26 ENCOUNTER — Emergency Department
Admission: EM | Admit: 2016-07-26 | Discharge: 2016-07-26 | Disposition: A | Discharge status: Home / Self Care | Payer: Medicare Other | Attending: Emergency Medicine | Admitting: Emergency Medicine

## 2016-07-26 ENCOUNTER — Emergency Department: Payer: Medicare Other

## 2016-07-26 DIAGNOSIS — Y92129 Unspecified place in nursing home as the place of occurrence of the external cause: Secondary | ICD-10-CM | POA: Diagnosis not present

## 2016-07-26 DIAGNOSIS — S0990XA Unspecified injury of head, initial encounter: Secondary | ICD-10-CM

## 2016-07-26 DIAGNOSIS — Z043 Encounter for examination and observation following other accident: Secondary | ICD-10-CM | POA: Diagnosis not present

## 2016-07-26 DIAGNOSIS — Y998 Other external cause status: Secondary | ICD-10-CM | POA: Insufficient documentation

## 2016-07-26 DIAGNOSIS — Z79899 Other long term (current) drug therapy: Secondary | ICD-10-CM | POA: Insufficient documentation

## 2016-07-26 DIAGNOSIS — Z8542 Personal history of malignant neoplasm of other parts of uterus: Secondary | ICD-10-CM | POA: Insufficient documentation

## 2016-07-26 DIAGNOSIS — W01198A Fall on same level from slipping, tripping and stumbling with subsequent striking against other object, initial encounter: Secondary | ICD-10-CM | POA: Insufficient documentation

## 2016-07-26 DIAGNOSIS — E119 Type 2 diabetes mellitus without complications: Secondary | ICD-10-CM | POA: Insufficient documentation

## 2016-07-26 DIAGNOSIS — Y9302 Activity, running: Secondary | ICD-10-CM | POA: Insufficient documentation

## 2016-07-26 DIAGNOSIS — S0083XA Contusion of other part of head, initial encounter: Secondary | ICD-10-CM | POA: Diagnosis not present

## 2016-07-26 DIAGNOSIS — I10 Essential (primary) hypertension: Secondary | ICD-10-CM | POA: Insufficient documentation

## 2016-07-26 MED ORDER — HALOPERIDOL 1 MG PO TABS: 1.0000 mg | ORAL_TABLET | Freq: Once | ORAL | Status: DC

## 2016-07-26 NOTE — ED Notes (Signed)
Bed alarm placed on patient. Family at bedside.

## 2016-07-26 NOTE — ED Notes (Signed)
Patient is back from CT scan.

## 2016-07-26 NOTE — ED Triage Notes (Signed)
Patient brought in by ems from [facility] for witnessed fall. Patient was running and tripped over a walker. Patient with hematoma to left forehead. Denies LOC. Patient with history of dementia. Per [facility] patient does not take any anticoagulants.

## 2016-07-26 NOTE — ED Provider Notes (Signed)
I assumed care of the patient from Dr. Corky Downs with CT scan pending which revealed: Final result by Sharyn Blitz, MD (07/26/16 21:19:53)           Narrative:   CLINICAL DATA: Fall. Head injury. Dementia.  EXAM: CT HEAD WITHOUT CONTRAST  TECHNIQUE: Contiguous axial images were obtained from the base of the skull through the vertex without intravenous contrast.  COMPARISON: 07/21/2016 head CT.  FINDINGS: Brain: No evidence of parenchymal hemorrhage or extra-axial fluid collection. No mass lesion, mass effect, or midline shift. No CT evidence of acute infarction. Nonspecific moderate subcortical and periventricular white matter hypodensity, most in keeping with chronic small vessel ischemic change. Generalized cerebral volume loss, most prominent in the temporal lobes. Stable ventricles with no acute hydrocephalus.  Vascular: No acute abnormality.  Skull: No evidence of calvarial fracture.  Sinuses/Orbits: Minimal partial opacification of the sphenoid sinus. No fluid levels.  Other: Small anterior left frontal scalp hematoma. The mastoid air cells are unopacified.  IMPRESSION: 1. Small left frontal scalp hematoma. 2. No evidence of acute intracranial abnormality. No evidence of calvarial fracture. 3. Generalized cerebral volume loss and moderate chronic small vessel ischemia.   Electronically Signed By: Ilona Sorrel M.D. On: 07/26/2016 21:19         patient will be discharged   Gregor Hams, MD 07/26/16 2230

## 2016-07-26 NOTE — ED Notes (Signed)
Abrasion on left forehead was cleaned with saline and gauze and covered with a tegaderm. Patient tolerated procedure well.

## 2016-07-26 NOTE — Discharge Instructions (Addendum)
CT head was normal. Patient appears to be at her baseline.

## 2016-07-26 NOTE — ED Provider Notes (Signed)
[facility] Emergency Department Provider Note   ____________________________________________    I have reviewed the triage vital signs and the nursing notes.   HISTORY  Chief Complaint Fall and Head Injury   Patient was dementia, unable to provide any history  HPI Melinda Hughes is a 83 y.o. female who presents after a fall. Per EMS the patient fell while ambulating forward. She fell down and struck the front side of her left forehead. She was able to ambulate after this, no other injuries reported. This occurred approximately 1 hour prior to arrival. She is not on blood thinners. Reportedly patient is at her baseline currently   Past Medical History:  Diagnosis Date  . Arthritis   . Dementia   . Diabetes mellitus without complication ([place])   . Hypertension   . Uterine cancer [facility])     Patient Active Problem List   Diagnosis Date Noted  . Sepsis ([place]) 05/15/2016  . Pressure ulcer 10/18/2015  . Acute encephalopathy 10/17/2015    Past Surgical History:  Procedure Laterality Date  . ABDOMINAL HYSTERECTOMY    . EYE SURGERY    . FRACTURE SURGERY      Prior to Admission medications   Medication Sig Start Date End Date Taking? Authorizing Provider  acetaminophen (TYLENOL) 325 MG tablet Take 2 tablets (650 mg total) by mouth every 6 (six) hours as needed for mild pain, fever or headache (or Fever >/= 100.5). 10/19/15   Gladstone Lighter, MD  bisacodyl (DULCOLAX) 10 MG suppository Place 1 suppository (10 mg total) rectally daily as needed for moderate constipation. 10/19/15   Gladstone Lighter, MD  cephALEXin (KEFLEX) 500 MG capsule Take 1 capsule (500 mg total) by mouth 3 (three) times daily. 05/18/16   Loletha Grayer, MD  docusate sodium (COLACE) 100 MG capsule Take 100 mg by mouth daily.    [provider]  nystatin cream (MYCOSTATIN) Apply 1 application topically 3 (three) times daily.    [provider]  polyethylene glycol  (MIRALAX / GLYCOLAX) packet Take 17 g by mouth daily.    [provider]     Allergies Codeine; Fluzone [flu virus vaccine]; Morphine and related; and Sulfa antibiotics  Family History  Problem Relation Age of Onset  . Hypertension Father     Social History Social History  Substance Use Topics  . Smoking status: Never Smoker  . Smokeless tobacco: Never Used  . Alcohol use No    Level V caveat: Unable to obtain accurate Review of Systems due to dementia     ____________________________________________   PHYSICAL EXAM:  VITAL SIGNS: ED Triage Vitals [07/26/16 1927]  Enc Vitals Group     BP (!) 166/83     Pulse Rate 82     Resp 20     Temp      Temp src      SpO2 100 %     Weight 160 lb (72.6 kg)     Height 5\' 5"  (1.651 m)     Head Circumference      Peak Flow      Pain Score      Pain Loc      Pain Edu?      Excl. in [place]?     Constitutional: Alert.. No acute distress. Pleasant and interactive Eyes: Conjunctivae are normal.  Head: Hematoma to the left forehead, no bleeding Nose: No congestion/rhinnorhea. Mouth/Throat: Mucous membranes are moist.   Neck:  Painless ROM, no vertebral tenderness to  palpation Cardiovascular: Normal rate, regular rhythm. Grossly normal heart sounds.  Good peripheral circulation. Respiratory: Normal respiratory effort.  No retractions. Lungs CTAB. Gastrointestinal: Soft and nontender. No distention.  No CVA tenderness. Genitourinary: deferred Musculoskeletal: No lower extremity tenderness nor edema.  Warm and well perfused. Moves both lower extremities without difficulty. Moves both upper x-rays without difficulty, full range of motion without pain. No pelvic tenderness to palpation Neurologic:   No gross focal neurologic deficits are appreciated.  Skin:  Skin is warm, dry and intact. No rash noted. Psychiatric: Patient is somewhat combative  ____________________________________________   LABS (all labs ordered are  listed, but only abnormal results are displayed)  Labs Reviewed - No data to display ____________________________________________  EKG  None ____________________________________________  RADIOLOGY  CT head ____________________________________________   PROCEDURES  Procedure(s) performed: No    Critical Care performed: No ____________________________________________   INITIAL IMPRESSION / ASSESSMENT AND PLAN / ED COURSE  Pertinent labs & imaging results that were available during my care of the patient were reviewed by me and considered in my medical decision making (see chart for details).  Patient well-appearing and in no acute distress. However she does have a significant hematoma to the left forehead. We will attempt to get CT imaging  I have asked Dr. Owens Shark to follow up on CT Head. If unremarkable, anticipate discharge.    ____________________________________________   FINAL CLINICAL IMPRESSION(S) / ED DIAGNOSES  Final diagnoses:  Injury of head, initial encounter      NEW MEDICATIONS STARTED DURING THIS VISIT:  New Prescriptions   No medications on file     Note:  This document was prepared using Dragon voice recognition software and may include unintentional dictation errors.    Lavonia Drafts, MD 07/26/16 2045

## 2016-07-26 NOTE — ED Notes (Signed)
Patient transported to CT 

## 2016-07-26 NOTE — ED Notes (Signed)
VS held per Dr. Corky Downs due to patient's agitation.

## 2016-07-31 ENCOUNTER — Inpatient Hospital Stay
Admission: EM | Admit: 2016-07-31 | Discharge: 2016-08-04 | DRG: 481 | Disposition: A | Discharge status: Skilled Nursing Facility | Payer: Medicare Other | Attending: Internal Medicine | Admitting: Internal Medicine

## 2016-07-31 ENCOUNTER — Emergency Department: Payer: Medicare Other

## 2016-07-31 ENCOUNTER — Inpatient Hospital Stay: Payer: Medicare Other

## 2016-07-31 ENCOUNTER — Encounter: Payer: Self-pay | Admitting: Emergency Medicine

## 2016-07-31 DIAGNOSIS — W19XXXD Unspecified fall, subsequent encounter: Secondary | ICD-10-CM

## 2016-07-31 DIAGNOSIS — W19XXXA Unspecified fall, initial encounter: Secondary | ICD-10-CM | POA: Diagnosis present

## 2016-07-31 DIAGNOSIS — Y92129 Unspecified place in nursing home as the place of occurrence of the external cause: Secondary | ICD-10-CM | POA: Diagnosis not present

## 2016-07-31 DIAGNOSIS — Z885 Allergy status to narcotic agent status: Secondary | ICD-10-CM | POA: Diagnosis not present

## 2016-07-31 DIAGNOSIS — I10 Essential (primary) hypertension: Secondary | ICD-10-CM | POA: Diagnosis present

## 2016-07-31 DIAGNOSIS — Z79899 Other long term (current) drug therapy: Secondary | ICD-10-CM | POA: Diagnosis not present

## 2016-07-31 DIAGNOSIS — F0390 Unspecified dementia without behavioral disturbance: Secondary | ICD-10-CM | POA: Diagnosis present

## 2016-07-31 DIAGNOSIS — S7222XA Displaced subtrochanteric fracture of left femur, initial encounter for closed fracture: Secondary | ICD-10-CM | POA: Diagnosis present

## 2016-07-31 DIAGNOSIS — S7292XA Unspecified fracture of left femur, initial encounter for closed fracture: Secondary | ICD-10-CM

## 2016-07-31 DIAGNOSIS — Z419 Encounter for procedure for purposes other than remedying health state, unspecified: Secondary | ICD-10-CM

## 2016-07-31 DIAGNOSIS — D62 Acute posthemorrhagic anemia: Secondary | ICD-10-CM | POA: Diagnosis not present

## 2016-07-31 DIAGNOSIS — R509 Fever, unspecified: Secondary | ICD-10-CM | POA: Diagnosis not present

## 2016-07-31 DIAGNOSIS — Z8542 Personal history of malignant neoplasm of other parts of uterus: Secondary | ICD-10-CM | POA: Diagnosis not present

## 2016-07-31 DIAGNOSIS — R52 Pain, unspecified: Secondary | ICD-10-CM

## 2016-07-31 DIAGNOSIS — S72409A Unspecified fracture of lower end of unspecified femur, initial encounter for closed fracture: Secondary | ICD-10-CM

## 2016-07-31 DIAGNOSIS — E119 Type 2 diabetes mellitus without complications: Secondary | ICD-10-CM | POA: Diagnosis present

## 2016-07-31 DIAGNOSIS — S7291XA Unspecified fracture of right femur, initial encounter for closed fracture: Secondary | ICD-10-CM

## 2016-07-31 LAB — CBC WITH DIFFERENTIAL/PLATELET
BASOS ABS: 0 10*3/uL (ref 0–0.1)
BASOS PCT: 0 %
Eosinophils Absolute: 0 10*3/uL (ref 0–0.7)
Eosinophils Relative: 0 %
HEMATOCRIT: 34.4 % — AB (ref 35.0–47.0)
HEMOGLOBIN: 11.5 g/dL — AB (ref 12.0–16.0)
LYMPHS PCT: 8 %
Lymphs Abs: 0.7 10*3/uL — ABNORMAL LOW (ref 1.0–3.6)
MCH: 28.3 pg (ref 26.0–34.0)
MCHC: 33.4 g/dL (ref 32.0–36.0)
MCV: 84.8 fL (ref 80.0–100.0)
Monocytes Absolute: 0.4 10*3/uL (ref 0.2–0.9)
Monocytes Relative: 4 %
NEUTROS ABS: 7.7 10*3/uL — AB (ref 1.4–6.5)
NEUTROS PCT: 88 %
Platelets: 187 10*3/uL (ref 150–440)
RBC: 4.05 MIL/uL (ref 3.80–5.20)
RDW: 15.3 % — AB (ref 11.5–14.5)
WBC: 8.8 10*3/uL (ref 3.6–11.0)

## 2016-07-31 LAB — COMPREHENSIVE METABOLIC PANEL
ALBUMIN: 3.9 g/dL (ref 3.5–5.0)
ALT: 13 U/L — AB (ref 14–54)
AST: 23 U/L (ref 15–41)
Alkaline Phosphatase: 72 U/L (ref 38–126)
Anion gap: 6 (ref 5–15)
BILIRUBIN TOTAL: 0.8 mg/dL (ref 0.3–1.2)
BUN: 17 mg/dL (ref 6–20)
CALCIUM: 9.1 mg/dL (ref 8.9–10.3)
CHLORIDE: 104 mmol/L (ref 101–111)
CO2: 27 mmol/L (ref 22–32)
CREATININE: 0.7 mg/dL (ref 0.44–1.00)
GFR calc Af Amer: >60 mL/min (ref >60)
GFR calc non Af Amer: >60 mL/min (ref >60)
Glucose, Bld: 175 mg/dL — ABNORMAL HIGH (ref 65–99)
Potassium: 4 mmol/L (ref 3.5–5.1)
Sodium: 137 mmol/L (ref 135–145)
TOTAL PROTEIN: 7.2 g/dL (ref 6.5–8.1)

## 2016-07-31 LAB — GLUCOSE, CAPILLARY: GLUCOSE-CAPILLARY: 102 mg/dL — AB (ref 65–99)

## 2016-07-31 LAB — PROTIME-INR
INR: 1.13
PROTHROMBIN TIME: 14.6 s (ref 11.4–15.2)

## 2016-07-31 LAB — APTT: aPTT: 32 seconds (ref 24–36)

## 2016-07-31 LAB — MRSA PCR SCREENING: MRSA BY PCR: NEGATIVE

## 2016-07-31 MED ORDER — HEPARIN SODIUM (PORCINE) 5000 UNIT/ML IJ SOLN: 5000.0000 [IU] | Freq: Three times a day (TID) | INTRAMUSCULAR | Status: DC

## 2016-07-31 MED ORDER — FENTANYL CITRATE (PF) 100 MCG/2ML IJ SOLN
25.0000 ug | Freq: Once | INTRAMUSCULAR | Status: AC
  Administered 2016-07-31: 25 ug via INTRAVENOUS
  Filled 2016-07-31: qty 2

## 2016-07-31 MED ORDER — ACETAMINOPHEN 325 MG PO TABS: 650.0000 mg | ORAL_TABLET | Freq: Four times a day (QID) | ORAL | Status: DC | PRN

## 2016-07-31 MED ORDER — CEFAZOLIN SODIUM-DEXTROSE 2-4 GM/100ML-% IV SOLN
2.0000 g | INTRAVENOUS | Status: AC
  Administered 2016-08-01: 2 g via INTRAVENOUS
  Filled 2016-07-31: qty 100

## 2016-07-31 MED ORDER — HYDRALAZINE HCL 25 MG PO TABS
25.0000 mg | ORAL_TABLET | Freq: Three times a day (TID) | ORAL | Status: DC
  Administered 2016-08-01 – 2016-08-04 (×4): 25 mg via ORAL
  Filled 2016-07-31 (×4): qty 1

## 2016-07-31 MED ORDER — SODIUM CHLORIDE 0.9 % IV SOLN
INTRAVENOUS | Status: DC
  Administered 2016-08-01: 75 mL/h via INTRAVENOUS
  Administered 2016-08-01: 13:00:00 via INTRAVENOUS

## 2016-07-31 MED ORDER — DOCUSATE SODIUM 100 MG PO CAPS: 100.0000 mg | ORAL_CAPSULE | Freq: Two times a day (BID) | ORAL | Status: DC

## 2016-07-31 MED ORDER — BISACODYL 5 MG PO TBEC
5.0000 mg | DELAYED_RELEASE_TABLET | Freq: Every day | ORAL | Status: DC | PRN
  Filled 2016-07-31: qty 1

## 2016-07-31 MED ORDER — TRAZODONE HCL 50 MG PO TABS: 25.0000 mg | ORAL_TABLET | Freq: Every evening | ORAL | Status: DC | PRN

## 2016-07-31 MED ORDER — ONDANSETRON HCL 4 MG/2ML IJ SOLN: 4.0000 mg | Freq: Four times a day (QID) | INTRAMUSCULAR | Status: DC | PRN

## 2016-07-31 MED ORDER — ONDANSETRON HCL 4 MG PO TABS: 4.0000 mg | ORAL_TABLET | Freq: Four times a day (QID) | ORAL | Status: DC | PRN

## 2016-07-31 MED ORDER — INSULIN ASPART 100 UNIT/ML ~~LOC~~ SOLN
0.0000 [IU] | Freq: Three times a day (TID) | SUBCUTANEOUS | Status: DC
  Administered 2016-08-01 – 2016-08-02 (×2): 1 [IU] via SUBCUTANEOUS
  Filled 2016-07-31 (×2): qty 1

## 2016-07-31 MED ORDER — HYDRALAZINE HCL 20 MG/ML IJ SOLN
20.0000 mg | Freq: Four times a day (QID) | INTRAMUSCULAR | Status: DC | PRN
  Administered 2016-08-01: 20 mg via INTRAVENOUS
  Filled 2016-07-31: qty 1

## 2016-07-31 MED ORDER — ACETAMINOPHEN 650 MG RE SUPP: 650.0000 mg | Freq: Four times a day (QID) | RECTAL | Status: DC | PRN

## 2016-07-31 MED ORDER — ENOXAPARIN SODIUM 40 MG/0.4ML ~~LOC~~ SOLN: 40.0000 mg | SUBCUTANEOUS | Status: DC

## 2016-07-31 NOTE — ED Notes (Signed)
Pt back from x-ray.

## 2016-07-31 NOTE — H&P (Signed)
[facility] NAME: Melinda Hughes    MR#:  [id number]  DATE OF BIRTH:  06-03-1931  DATE OF ADMISSION:  07/31/2016  PRIMARY CARE PHYSICIAN: Kirk Ruths, MD   REQUESTING/REFERRING PHYSICIAN: Dr. Esperanza Heir  CHIEF COMPLAINT: Fall    Chief Complaint  Patient presents with  . Fall    HISTORY OF PRESENT ILLNESS:  Melinda Hughes  is a 83 y.o. female with a known history of Severe dementia comes from [facility] because of the fall, patient had a fall on Saturday of the last week. Family noticed that is  not able to bear weight on the left leg. Presents with external rotation and shortening of the left leg. Patient was given fentanyl en route by EMS. Patient has severe dementia so unable to give any history and review of systems. Aortic valve present patient was ambulatory after the last fall without any difficulty going up and down stairs.  PAST MEDICAL HISTORY:   Past Medical History:  Diagnosis Date  . Arthritis   . Dementia   . Diabetes mellitus without complication ([place])   . Hypertension   . Uterine cancer ([place])     PAST SURGICAL HISTOIRY:   Past Surgical History:  Procedure Laterality Date  . ABDOMINAL HYSTERECTOMY    . EYE SURGERY    . FRACTURE SURGERY      SOCIAL HISTORY:   Social History  Substance Use Topics  . Smoking status: Never Smoker  . Smokeless tobacco: Never Used  . Alcohol use No    FAMILY HISTORY:   Family History  Problem Relation Age of Onset  . Hypertension Father     DRUG ALLERGIES:   Allergies  Allergen Reactions  . Codeine Itching  . Fluzone [Flu Virus Vaccine]     Unknown reaction. Received info from nursing home  . Morphine And Related Itching  . Sulfa Antibiotics Itching    REVIEW OF SYSTEMS:  unable to obtain review of systems secondary to severe dementia. MEDICATIONS AT HOME:   Prior to Admission medications   Medication Sig Start Date End Date  Taking? Authorizing Provider  acetaminophen (TYLENOL) 325 MG tablet Take 2 tablets (650 mg total) by mouth every 6 (six) hours as needed for mild pain, fever or headache (or Fever >/= 100.5). 10/19/15  Yes Gladstone Lighter, MD  bisacodyl (DULCOLAX) 10 MG suppository Place 1 suppository (10 mg total) rectally daily as needed for moderate constipation. 10/19/15  Yes Gladstone Lighter, MD  docusate sodium (COLACE) 100 MG capsule Take 100 mg by mouth daily.   Yes [provider]  nystatin cream (MYCOSTATIN) Apply 1 application topically 3 (three) times daily.   Yes [provider]  polyethylene glycol (MIRALAX / GLYCOLAX) packet Take 17 g by mouth daily.   Yes [provider]      VITAL SIGNS:  Blood pressure (!) 203/79, pulse 83, temperature 98.3 F (36.8 C), temperature source Oral, resp. rate 19, height 5\' 5"  (1.651 m), weight 72.6 kg (160 lb), SpO2 100 %.  PHYSICAL EXAMINATION:  GENERAL:  83 y.o.-year-old patient lying in the bed Moaning at times. EYES: Pupils equal, round, reactive to light . No scleral icterus.  HEENT: Head atraumatic, normocephalic. Oropharynx and nasopharynx clear.  NECK:   no jugular venous distention. No thyroid enlargement, no tenderness.  LUNGS: Normal breath sounds bilaterally, no wheezing, rales,rhonchi or crepitation. No use of accessory muscles of respiration.  CARDIOVASCULAR: S1, S2 normal. No murmurs,  rubs, or gallops.  ABDOMEN: Soft, nontender, nondistended. Bowel sounds present. No organomegaly or mass.  EXTREMITIES: No pedal edema, cyanosis, or clubbing.  NEUROLOGIC: Unable to do full neurological exam because of severe dementia  PSYCHIATRIC: severe demented baseline. SKIN: No obvious rash, lesion, or ulcer.   LABORATORY PANEL:   CBC  Recent Labs Lab 07/31/16 1148  WBC 8.8  HGB 11.5*  HCT 34.4*  PLT 187    ------------------------------------------------------------------------------------------------------------------  Chemistries   Recent Labs Lab 07/31/16 1148  NA 137  K 4.0  CL 104  CO2 27  GLUCOSE 175*  BUN 17  CREATININE 0.70  CALCIUM 9.1  AST 23  ALT 13*  ALKPHOS 72  BILITOT 0.8   ------------------------------------------------------------------------------------------------------------------  Cardiac Enzymes No results for input(s): TROPONINI in the last 168 hours. ------------------------------------------------------------------------------------------------------------------  RADIOLOGY:  Dg Chest 1 View  Result Date: 07/31/2016 CLINICAL DATA:  Status post fall. History of hypertension, diabetes, uterine malignancy, nonsmoker. EXAM: CHEST 1 VIEW COMPARISON:  Portable chest x-ray of Jul 21, 2016 FINDINGS: The lungs are adequately inflated. There is no focal infiltrate. There are nodules in the lower lung zones bilaterally not appreciably changed since the previous study. The heart and pulmonary vascularity are normal. There is calcification in the wall of the aortic arch. There is mild multilevel degenerative disc disease of the thoracic spine. There degenerative changes of both shoulders. IMPRESSION: No evidence of CHF nor pneumonia. Stable nodules in the lower lungs bilaterally. A PA and lateral chest x-ray is recommended if the patient can undergo the procedure. Thoracic aortic atherosclerosis. Electronically Signed   By: David  Martinique M.D.   On: 07/31/2016 13:12   Dg Femur Min 2 Views Left  Result Date: 07/31/2016 CLINICAL DATA:  Fall a couple of days ago, patient has dementia, unable to communicate, held by techs for images EXAM: LEFT FEMUR 2 VIEWS COMPARISON:  None. FINDINGS: Prior left hip replacement. There is a comminuted mildly displaced fracture noted through the distal metaphysis and likely entering the knee joint in the region of the femoral notch. No  subluxation or dislocation. IMPRESSION: Comminuted, mildly displaced distal left femoral fracture. Electronically Signed   By: Rolm Baptise M.D.   On: 07/31/2016 13:18   Dg Femur, Min 2 Views Right  Result Date: 07/31/2016 CLINICAL DATA:  Fall, pain EXAM: RIGHT FEMUR 2 VIEWS COMPARISON:  None. FINDINGS: Advanced degenerative changes in the right knee. Mild degenerative changes in the right hip. No acute bony abnormality. Specifically, no fracture, subluxation, or dislocation. Soft tissues are intact. No joint effusion. IMPRESSION: No acute bony abnormality. Electronically Signed   By: Rolm Baptise M.D.   On: 07/31/2016 13:18    EKG:   Orders placed or performed during the hospital encounter of 07/31/16  . ED EKG  . ED EKG    IMPRESSION AND PLAN:   #26.83 year old female patient with severe dementia brought in by EMS because of the fall at nursing home. Patient has left femur fracture. Admit to hospitalist service for left femur fracture. Patient is allergic to codeine, morphine ,received fentanyl by EMS. Consult orthopedic for left femur fracture. Family interested in operative repair of Femur fracture. Patient is at moderate risk for surgery because of advanced a severe dementia/and malignant hypertension.  2. 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 Patient is on  HCTZ for BP> 3. diabetes mellitus type 2: Use SSI with coverage. #4. dementia: Patient on Seroquel 25 mg by mouth  twice a day.   by EMS.All the records are reviewed and case discussed with ED provider. Management plans discussed with the patient, family and they are in agreement.  CODE STATUS: full  TOTAL TIME TAKING CARE OF THIS PATIENT: 16minutes.    Epifanio Lesches M.D on 07/31/2016 at 2:55 PM  Between 7am to 6pm - Pager - [phone]  After 6pm go to [email or web address] - password [email or web address] [facility]  [facility]  Office   [phone]  CC: Primary care physician; Kirk Ruths, MD  Note: This dictation was prepared with Dragon dictation along with smaller phrase technology. Any transcriptional errors that result from this process are unintentional.

## 2016-07-31 NOTE — ED Triage Notes (Signed)
Pt comes into the ED via EMS from [facility] where she took a fall on Saturday.  Patient seen for her head on Saturday but everything was cleared.  Facility noticed patient unable to ambulate or bear weight on the left leg.  Patient is present with external rotation and shortening of the left leg.  Patient given 50 fentanyl in route with EMS. Patient in NAD at this time with even and unlabored respirations and good pain management at this time.

## 2016-07-31 NOTE — ED Provider Notes (Addendum)
[facility] Emergency Department Provider Note       Time seen: ----------------------------------------- 11:47 AM on 07/31/2016 -----------------------------------------  L5 caveat: Review of systems and history is limited by dementia.   I have reviewed the triage vital signs and the nursing notes.   HISTORY   Chief Complaint Fall    HPI Melinda Hughes is a 83 y.o. female who presents to the ED for a recent fall. Patient was seen brought by EMS from [place] on Saturday after a fall. She had a CT scan of her head on Saturday but everything was normal. Family noted that she is not able to bear weight on the left leg. She presents with external rotation and shortening. She was given fentanyl in route by EMS. She has severe dementia and is unable to give any review of systems or report. Husband reports she was ambulatory after the last fall without any difficulty and going up and down stairs.   Past Medical History:  Diagnosis Date  . Arthritis   . Dementia   . Diabetes mellitus without complication ([place])   . Hypertension   . Uterine cancer [facility])     Patient Active Problem List   Diagnosis Date Noted  . Sepsis ([place]) 05/15/2016  . Pressure ulcer 10/18/2015  . Acute encephalopathy 10/17/2015    Past Surgical History:  Procedure Laterality Date  . ABDOMINAL HYSTERECTOMY    . EYE SURGERY    . FRACTURE SURGERY      Allergies Codeine; Fluzone [flu virus vaccine]; Morphine and related; and Sulfa antibiotics  Social History Social History  Substance Use Topics  . Smoking status: Never Smoker  . Smokeless tobacco: Never Used  . Alcohol use No    Review of Systems Musculoskeletal: Positive for reported hip pain with attempted ambulation, rotation of the left leg Skin: Positive for bruising on the right leg  All systems negative/normal/unremarkable or unknown except as stated in the  HPI  ____________________________________________   PHYSICAL EXAM:  VITAL SIGNS: ED Triage Vitals [07/31/16 1144]  Enc Vitals Group     BP      Pulse      Resp      Temp      Temp src      SpO2      Weight 160 lb (72.6 kg)     Height 5\' 5"  (1.651 m)     Head Circumference      Peak Flow      Pain Score      Pain Loc      Pain Edu?      Excl. in [place]?     Constitutional: Alert But disoriented Well appearing and in no distress. Eyes: Conjunctivae are normal. Normal extraocular movements. ENT   Head: Left forehead ecchymosis that appears subacute   Nose: No congestion/rhinnorhea.   Mouth/Throat: Mucous membranes are moist.   Neck: No stridor. Cardiovascular: Normal rate, regular rhythm. No murmurs, rubs, or gallops. Respiratory: Normal respiratory effort without tachypnea nor retractions. Breath sounds are clear and equal bilaterally. No wheezes/rales/rhonchi. Gastrointestinal: Soft and nontender. Normal bowel sounds Musculoskeletal: Severe pain range of motion of both legs, worse in the left. There does appear to be external rotation and shortening of the left leg. Neurologic:  Normal speech and language. No gross focal neurologic deficits are appreciated.  Skin:  Skin is warm, dry and intact. Bruising is noted along the right thigh Psychiatric:  Somewhat agitated ____________________________________________  ED COURSE:  Pertinent labs &  imaging results that were available during my care of the patient were reviewed by me and considered in my medical decision making (see chart for details). Patient presents for recent fall and limited weightbearing, we will assess with labs and imaging as indicated.   Procedures ____________________________________________   LABS (pertinent positives/negatives)  Labs Reviewed  CBC WITH DIFFERENTIAL/PLATELET - Abnormal; Notable for the following:       Result Value   Hemoglobin 11.5 (*)    HCT 34.4 (*)    RDW 15.3 (*)     Neutro Abs 7.7 (*)    Lymphs Abs 0.7 (*)    All other components within normal limits  COMPREHENSIVE METABOLIC PANEL - Abnormal; Notable for the following:    Glucose, Bld 175 (*)    ALT 13 (*)    All other components within normal limits    RADIOLOGY Images were viewed by me  Bilateral femur x-rays IMPRESSION: Comminuted, mildly displaced distal left femoral fracture. IMPRESSION: No acute bony abnormality.  IMPRESSION: No evidence of CHF nor pneumonia. Stable nodules in the lower lungs bilaterally. A PA and lateral chest x-ray is recommended if the patient can undergo the procedure.  Thoracic aortic atherosclerosis. ____________________________________________  FINAL ASSESSMENT AND PLAN  Fall, left femur fracture  Plan: Patient's labs and imaging were dictated above. Patient had presented for recent fall. Patient was noted to have pain with range of motion of the left leg and has a distal left femur fracture. We will discuss with orthopedics. She is typically very ambulatory at baseline, she likely had a secondary fall after being seen here the last time. We will discuss with the hospitalist for admission and orthopedics consultation.   Earleen Newport, MD   Note: This note was generated in part or whole with voice recognition software. Voice recognition is usually quite accurate but there are transcription errors that can and very often do occur. I apologize for any typographical errors that were not detected and corrected.     Earleen Newport, MD 07/31/16 1329    Earleen Newport, MD 07/31/16 1346

## 2016-07-31 NOTE — Consult Note (Signed)
ORTHOPAEDIC CONSULTATION  REQUESTING PHYSICIAN: Epifanio Lesches, MD  Chief Complaint: Left distal femur fracture  HPI: Melinda Hughes is a 83 y.o. female with profound dementia presented from her nursing facility after she was noted not to be weightbearing on the left lower extremity. Despite her dementia she is ambulatory at baseline.  Patient is seen in her hospital room lying in bed. Her family is at the bedside. Patient is not able to provide any history given her dementia.  Past Medical History:  Diagnosis Date  . Arthritis   . Dementia   . Diabetes mellitus without complication ([place])   . Hypertension   . Uterine cancer [facility])    Past Surgical History:  Procedure Laterality Date  . ABDOMINAL HYSTERECTOMY    . EYE SURGERY    . FRACTURE SURGERY     Social History   Social History  . Marital status: Married    Spouse name: N/A  . Number of children: N/A  . Years of education: N/A   Social History Main Topics  . Smoking status: Never Smoker  . Smokeless tobacco: Never Used  . Alcohol use No  . Drug use: No  . Sexual activity: No   Other Topics Concern  . None   Social History Narrative  . None   Family History  Problem Relation Age of Onset  . Hypertension Father    Allergies  Allergen Reactions  . Codeine Itching  . Fluzone [Flu Virus Vaccine]     Unknown reaction. Received info from nursing home  . Morphine And Related Itching  . Sulfa Antibiotics Itching   Prior to Admission medications   Medication Sig Start Date End Date Taking? Authorizing Provider  acetaminophen (TYLENOL) 325 MG tablet Take 2 tablets (650 mg total) by mouth every 6 (six) hours as needed for mild pain, fever or headache (or Fever >/= 100.5). 10/19/15  Yes Gladstone Lighter, MD  bisacodyl (DULCOLAX) 10 MG suppository Place 1 suppository (10 mg total) rectally daily as needed for moderate constipation. 10/19/15  Yes Gladstone Lighter, MD  docusate sodium (COLACE) 100 MG capsule Take  100 mg by mouth daily.   Yes [provider]  nystatin cream (MYCOSTATIN) Apply 1 application topically 3 (three) times daily.   Yes [provider]  polyethylene glycol (MIRALAX / GLYCOLAX) packet Take 17 g by mouth daily.   Yes [provider]   Dg Chest 1 View  Result Date: 07/31/2016 CLINICAL DATA:  Status post fall. History of hypertension, diabetes, uterine malignancy, nonsmoker. EXAM: CHEST 1 VIEW COMPARISON:  Portable chest x-ray of Jul 21, 2016 FINDINGS: The lungs are adequately inflated. There is no focal infiltrate. There are nodules in the lower lung zones bilaterally not appreciably changed since the previous study. The heart and pulmonary vascularity are normal. There is calcification in the wall of the aortic arch. There is mild multilevel degenerative disc disease of the thoracic spine. There degenerative changes of both shoulders. IMPRESSION: No evidence of CHF nor pneumonia. Stable nodules in the lower lungs bilaterally. A PA and lateral chest x-ray is recommended if the patient can undergo the procedure. Thoracic aortic atherosclerosis. Electronically Signed   By: David  Martinique M.D.   On: 07/31/2016 13:12   Ct Knee Left Wo Contrast  Result Date: 07/31/2016 CLINICAL DATA:  Left knee pain after fall on Saturday of last week. Unable to weightbear. EXAM: CT OF THE LEFT KNEE WITHOUT CONTRAST TECHNIQUE: Multidetector CT imaging of the left knee was performed according to the  standard protocol. Multiplanar CT image reconstructions were also generated. COMPARISON:  07/31/2016 radiographs of the left femur FINDINGS: Bones/Joint/Cartilage Acute, closed, comminuted supracondylar coronal oblique fracture of the distal left femoral diaphysis and metaphysis with sagittal intercondylar component is noted. Slight dorsal displacement of the distal fracture fragment and condyles relative to the distal femoral diaphysis and metaphysis up to 1 cm at its proximal fracture margin. No  fracture of the patella nor tibial plateau. Osteoarthritic subchondral sclerosis is noted along the weight-bearing portion of the medial femoral condyles. Ligaments Suboptimally assessed by CT. Muscles and Tendons Intact extensor mechanism tendons. No intramuscular hemorrhage or atrophy. Soft tissues There is a small to moderate joint effusion with soft tissue induration along the lateral aspect of the knee. IMPRESSION: 1. Acute, closed, comminuted supracondylar coronal oblique fracture of the distal left femoral diaphysis and metaphysis with sagittal intercondylar component extending into the knee joint is noted. Slight dorsal displacement of the distal fracture fragment and condyles relative to the distal femoral diaphysis and metaphysis up to 1 cm at its proximal fracture margin. 2. No fracture of the tibia nor fibula. 3. Small joint effusion with periarticular soft tissue swelling more so along the lateral aspect. Electronically Signed   By: Ashley Royalty M.D.   On: 07/31/2016 17:48   Dg Femur Min 2 Views Left  Result Date: 07/31/2016 CLINICAL DATA:  Fall a couple of days ago, patient has dementia, unable to communicate, held by techs for images EXAM: LEFT FEMUR 2 VIEWS COMPARISON:  None. FINDINGS: Prior left hip replacement. There is a comminuted mildly displaced fracture noted through the distal metaphysis and likely entering the knee joint in the region of the femoral notch. No subluxation or dislocation. IMPRESSION: Comminuted, mildly displaced distal left femoral fracture. Electronically Signed   By: Rolm Baptise M.D.   On: 07/31/2016 13:18   Dg Femur, Min 2 Views Right  Result Date: 07/31/2016 CLINICAL DATA:  Fall, pain EXAM: RIGHT FEMUR 2 VIEWS COMPARISON:  None. FINDINGS: Advanced degenerative changes in the right knee. Mild degenerative changes in the right hip. No acute bony abnormality. Specifically, no fracture, subluxation, or dislocation. Soft tissues are intact. No joint effusion.  IMPRESSION: No acute bony abnormality. Electronically Signed   By: Rolm Baptise M.D.   On: 07/31/2016 13:18    Positive ROS: All other systems have been reviewed and were otherwise negative with the exception of those mentioned in the HPI and as above.  Physical Exam: General: Patient is awake and agitated. She is unable to follow commands.  MUSCULOSKELETAL: Left lower extremity: Patient is in a long knee immobilizer. Distally she has palpable pedal pulses. Her foot is well perfused. There is no significant rotation seen. Motor and sensory function cannot be tested. No spontaneous movement of the lower extremities was noted during exam.  Assessment: Comminuted left distal femur fracture  Plan: I reviewed the patient's plain films and CT scan with 3-D reconstructions. Patient has a significantly comminuted distal femur fracture with intercondylar extension. There is displacement of the fracture. I explained these findings to the patient's family. We discussed nonoperative versus operative management. The family expressed a desire to proceed with surgery. I reviewed the details of the operation. They understand and will involve a large lateral incision over her left thigh. A large plate and screws and possibly circumferential wires will be required to stabilize the distal femur. We discussed the postoperative course which will involve 12 weeks of nonweightbearing.  I reviewed the risks and benefits of  surgery. They understand the risks include but are not limited to infection, wound breakdown, malunion, nonunion, hardware failure, persistent pain, failure to return to ambulation and the need for further surgery. They also understand the medical risks include but are not limited to DVT and pulmonary embolism, myocardial infarction, stroke, pneumonia, respiratory failure and death. He understood these risks and wished to proceed with surgery.  A PT and PTT are pending. Otherwise I reviewed the laboratories  which are within acceptable limits. Patient was deemed to moderate risk for surgery by the hospitalist service. Patient will be nothing by mouth after midnight. She should not receive anticoagulation therapy. Her surgery is tentatively scheduled for 10:30 tomorrow morning.    Thornton [place], MD    07/31/2016 7:04 PM

## 2016-07-31 NOTE — ED Notes (Signed)
Pt checked and repositioned in bed. Pt dry at this time. Pt now resting comfortably. Family at bedside.

## 2016-07-31 NOTE — Plan of Care (Signed)
Problem: Pain Managment: Goal: General experience of comfort will improve Outcome: Not Progressing Patient unable to verbalize needs

## 2016-07-31 NOTE — ED Notes (Signed)
[facility] called and informed that the patient will be admitted to the hospital for a femoral fracture.  Anderson Malta was the Rn informed.  Staff verbalized understanding of all information given.

## 2016-07-31 NOTE — ED Notes (Signed)
Pt resting comfortably. Respirations even and unlabored.  

## 2016-07-31 NOTE — Progress Notes (Signed)
Patient moving around constantly, very agitated, r/t severe dementia, inaccurate BP.

## 2016-07-31 NOTE — Progress Notes (Signed)
Patient is disoriented x 4, from [place] with unwitnessed fall/fx. Multiple bruising. Brace in place to left leg.

## 2016-07-31 NOTE — ED Notes (Signed)
Patient transported to X-ray 

## 2016-08-01 ENCOUNTER — Inpatient Hospital Stay: Payer: Medicare Other | Admitting: Anesthesiology

## 2016-08-01 ENCOUNTER — Inpatient Hospital Stay: Payer: Medicare Other

## 2016-08-01 ENCOUNTER — Encounter: Payer: Self-pay | Admitting: *Deleted

## 2016-08-01 ENCOUNTER — Encounter
Admission: EM | Disposition: A | Discharge status: Skilled Nursing Facility | Payer: Self-pay | Source: Home / Self Care | Attending: Internal Medicine

## 2016-08-01 HISTORY — PX: ORIF FEMUR FRACTURE: SHX2119

## 2016-08-01 LAB — CBC
HEMATOCRIT: 29.9 % — AB (ref 35.0–47.0)
HEMOGLOBIN: 10 g/dL — AB (ref 12.0–16.0)
MCH: 28.2 pg (ref 26.0–34.0)
MCHC: 33.6 g/dL (ref 32.0–36.0)
MCV: 84.1 fL (ref 80.0–100.0)
Platelets: 170 10*3/uL (ref 150–440)
RBC: 3.55 MIL/uL — AB (ref 3.80–5.20)
RDW: 15.2 % — AB (ref 11.5–14.5)
WBC: 7.1 10*3/uL (ref 3.6–11.0)

## 2016-08-01 LAB — GLUCOSE, CAPILLARY
GLUCOSE-CAPILLARY: 110 mg/dL — AB (ref 65–99)
GLUCOSE-CAPILLARY: 84 mg/dL (ref 65–99)
GLUCOSE-CAPILLARY: 109 mg/dL — AB (ref 65–99)
GLUCOSE-CAPILLARY: 145 mg/dL — AB (ref 65–99)
Glucose-Capillary: 127 mg/dL — ABNORMAL HIGH (ref 65–99)

## 2016-08-01 LAB — BASIC METABOLIC PANEL
Anion gap: 7 (ref 5–15)
BUN: 15 mg/dL (ref 6–20)
CO2: 27 mmol/L (ref 22–32)
Calcium: 8.7 mg/dL — ABNORMAL LOW (ref 8.9–10.3)
Chloride: 105 mmol/L (ref 101–111)
Creatinine, Ser: 0.73 mg/dL (ref 0.44–1.00)
Glucose, Bld: 139 mg/dL — ABNORMAL HIGH (ref 65–99)
POTASSIUM: 3.8 mmol/L (ref 3.5–5.1)
SODIUM: 139 mmol/L (ref 135–145)

## 2016-08-01 SURGERY — OPEN REDUCTION INTERNAL FIXATION (ORIF) DISTAL FEMUR FRACTURE
Anesthesia: Spinal | Site: Leg Upper | Laterality: Left | Wound class: Clean

## 2016-08-01 MED ORDER — HYDROMORPHONE HCL 1 MG/ML IJ SOLN: 0.5000 mg | INTRAMUSCULAR | Status: DC | PRN

## 2016-08-01 MED ORDER — SENNA 8.6 MG PO TABS
1.0000 | ORAL_TABLET | Freq: Two times a day (BID) | ORAL | Status: DC
  Administered 2016-08-01 – 2016-08-04 (×3): 8.6 mg via ORAL
  Filled 2016-08-01 (×3): qty 1

## 2016-08-01 MED ORDER — PROPOFOL 10 MG/ML IV BOLUS
INTRAVENOUS | Status: AC
  Filled 2016-08-01: qty 20

## 2016-08-01 MED ORDER — HYDROCODONE-ACETAMINOPHEN 5-325 MG PO TABS: 1.0000 | ORAL_TABLET | Freq: Four times a day (QID) | ORAL | Status: DC | PRN

## 2016-08-01 MED ORDER — DOCUSATE SODIUM 100 MG PO CAPS
100.0000 mg | ORAL_CAPSULE | Freq: Two times a day (BID) | ORAL | Status: DC
  Administered 2016-08-01 – 2016-08-04 (×2): 100 mg via ORAL
  Filled 2016-08-01 (×3): qty 1

## 2016-08-01 MED ORDER — FENTANYL CITRATE (PF) 100 MCG/2ML IJ SOLN
INTRAMUSCULAR | Status: DC | PRN
  Administered 2016-08-01: 25 ug via INTRAVENOUS

## 2016-08-01 MED ORDER — METHOCARBAMOL 1000 MG/10ML IJ SOLN
500.0000 mg | Freq: Four times a day (QID) | INTRAVENOUS | Status: DC | PRN
  Filled 2016-08-01: qty 5

## 2016-08-01 MED ORDER — METHOCARBAMOL 500 MG PO TABS
500.0000 mg | ORAL_TABLET | Freq: Four times a day (QID) | ORAL | Status: DC | PRN
  Administered 2016-08-01: 500 mg via ORAL
  Filled 2016-08-01: qty 1

## 2016-08-01 MED ORDER — PHENYLEPHRINE HCL 10 MG/ML IJ SOLN
INTRAMUSCULAR | Status: DC | PRN
  Administered 2016-08-01: 50 ug/min via INTRAVENOUS

## 2016-08-01 MED ORDER — PHENOL 1.4 % MT LIQD
1.0000 | OROMUCOSAL | Status: DC | PRN
  Filled 2016-08-01: qty 177

## 2016-08-01 MED ORDER — PROPOFOL 500 MG/50ML IV EMUL
INTRAVENOUS | Status: AC
  Filled 2016-08-01: qty 50

## 2016-08-01 MED ORDER — FENTANYL CITRATE (PF) 100 MCG/2ML IJ SOLN
INTRAMUSCULAR | Status: AC
Start: 2016-08-01 — End: 2016-08-01
  Administered 2016-08-01: 50 ug via INTRAVENOUS
  Filled 2016-08-01: qty 2

## 2016-08-01 MED ORDER — ACETAMINOPHEN 325 MG PO TABS
650.0000 mg | ORAL_TABLET | Freq: Four times a day (QID) | ORAL | Status: DC | PRN
  Administered 2016-08-04: 650 mg via ORAL
  Filled 2016-08-01: qty 2

## 2016-08-01 MED ORDER — MENTHOL 3 MG MT LOZG
1.0000 | LOZENGE | OROMUCOSAL | Status: DC | PRN
  Filled 2016-08-01: qty 9

## 2016-08-01 MED ORDER — POLYETHYLENE GLYCOL 3350 17 G PO PACK
17.0000 g | PACK | Freq: Every day | ORAL | Status: DC | PRN
  Administered 2016-08-04: 17 g via ORAL
  Filled 2016-08-01: qty 1

## 2016-08-01 MED ORDER — FERROUS SULFATE 325 (65 FE) MG PO TABS
325.0000 mg | ORAL_TABLET | Freq: Three times a day (TID) | ORAL | Status: DC
  Administered 2016-08-04 (×2): 325 mg via ORAL
  Filled 2016-08-01 (×2): qty 1

## 2016-08-01 MED ORDER — ENSURE ENLIVE PO LIQD: 237.0000 mL | Freq: Two times a day (BID) | ORAL | Status: DC

## 2016-08-01 MED ORDER — ONDANSETRON HCL 4 MG PO TABS: 4.0000 mg | ORAL_TABLET | Freq: Four times a day (QID) | ORAL | Status: DC | PRN

## 2016-08-01 MED ORDER — BUPIVACAINE-EPINEPHRINE (PF) 0.25% -1:200000 IJ SOLN
INTRAMUSCULAR | Status: AC
Start: 2016-08-01 — End: 2016-08-01
  Filled 2016-08-01: qty 30

## 2016-08-01 MED ORDER — ENOXAPARIN SODIUM 40 MG/0.4ML ~~LOC~~ SOLN
40.0000 mg | SUBCUTANEOUS | Status: DC
  Administered 2016-08-02 – 2016-08-04 (×3): 40 mg via SUBCUTANEOUS
  Filled 2016-08-01 (×3): qty 0.4

## 2016-08-01 MED ORDER — PHENYLEPHRINE HCL 10 MG/ML IJ SOLN
INTRAMUSCULAR | Status: DC | PRN
  Administered 2016-08-01: 100 ug via INTRAVENOUS
  Administered 2016-08-01: 200 ug via INTRAVENOUS

## 2016-08-01 MED ORDER — ADULT MULTIVITAMIN W/MINERALS CH
1.0000 | ORAL_TABLET | Freq: Every day | ORAL | Status: DC
  Filled 2016-08-01: qty 1

## 2016-08-01 MED ORDER — BISACODYL 10 MG RE SUPP
10.0000 mg | Freq: Every day | RECTAL | Status: DC | PRN
  Administered 2016-08-04: 10 mg via RECTAL
  Filled 2016-08-01: qty 1

## 2016-08-01 MED ORDER — NEOMYCIN-POLYMYXIN B GU 40-200000 IR SOLN
Status: DC | PRN
Start: 2016-08-01 — End: 2016-08-01
  Administered 2016-08-01: 4 mL

## 2016-08-01 MED ORDER — CEFAZOLIN SODIUM-DEXTROSE 1-4 GM/50ML-% IV SOLN
1.0000 g | Freq: Four times a day (QID) | INTRAVENOUS | Status: AC
  Administered 2016-08-01 – 2016-08-02 (×2): 1 g via INTRAVENOUS
  Filled 2016-08-01 (×2): qty 50

## 2016-08-01 MED ORDER — SODIUM CHLORIDE 0.9 % IV SOLN
75.0000 mL/h | INTRAVENOUS | Status: DC
  Administered 2016-08-01 – 2016-08-04 (×4): 75 mL/h via INTRAVENOUS

## 2016-08-01 MED ORDER — PROPOFOL 10 MG/ML IV BOLUS
INTRAVENOUS | Status: DC | PRN
  Administered 2016-08-01: 10 mg via INTRAVENOUS
  Administered 2016-08-01 (×2): 20 mg via INTRAVENOUS

## 2016-08-01 MED ORDER — ONDANSETRON HCL 4 MG/2ML IJ SOLN
INTRAMUSCULAR | Status: AC
  Filled 2016-08-01: qty 2

## 2016-08-01 MED ORDER — MEPERIDINE HCL 50 MG/ML IJ SOLN: 6.2500 mg | INTRAMUSCULAR | Status: DC | PRN

## 2016-08-01 MED ORDER — PHENYLEPHRINE HCL 10 MG/ML IJ SOLN
INTRAMUSCULAR | Status: AC
  Filled 2016-08-01: qty 1

## 2016-08-01 MED ORDER — LIDOCAINE HCL (PF) 2 % IJ SOLN
INTRAMUSCULAR | Status: AC
  Filled 2016-08-01: qty 2

## 2016-08-01 MED ORDER — FENTANYL CITRATE (PF) 100 MCG/2ML IJ SOLN
INTRAMUSCULAR | Status: AC
  Filled 2016-08-01: qty 2

## 2016-08-01 MED ORDER — PROMETHAZINE HCL 25 MG/ML IJ SOLN: 6.2500 mg | INTRAMUSCULAR | Status: DC | PRN

## 2016-08-01 MED ORDER — ONDANSETRON HCL 4 MG/2ML IJ SOLN: 4.0000 mg | Freq: Four times a day (QID) | INTRAMUSCULAR | Status: DC | PRN

## 2016-08-01 MED ORDER — MAGNESIUM CITRATE PO SOLN
1.0000 | Freq: Once | ORAL | Status: DC | PRN
  Filled 2016-08-01: qty 296

## 2016-08-01 MED ORDER — PROPOFOL 500 MG/50ML IV EMUL
INTRAVENOUS | Status: DC | PRN
  Administered 2016-08-01: 30 ug/kg/min via INTRAVENOUS

## 2016-08-01 MED ORDER — FENTANYL CITRATE (PF) 100 MCG/2ML IJ SOLN
25.0000 ug | INTRAMUSCULAR | Status: DC | PRN
Start: 2016-08-01 — End: 2016-08-01
  Administered 2016-08-01 (×2): 50 ug via INTRAVENOUS

## 2016-08-01 MED ORDER — ACETAMINOPHEN 650 MG RE SUPP
650.0000 mg | Freq: Four times a day (QID) | RECTAL | Status: DC | PRN
  Administered 2016-08-02: 650 mg via RECTAL
  Filled 2016-08-01: qty 1

## 2016-08-01 MED ORDER — ALUM & MAG HYDROXIDE-SIMETH 200-200-20 MG/5ML PO SUSP: 30.0000 mL | ORAL | Status: DC | PRN

## 2016-08-01 MED ORDER — NEOMYCIN-POLYMYXIN B GU 40-200000 IR SOLN
Status: AC
Start: 2016-08-01 — End: 2016-08-01
  Filled 2016-08-01: qty 4

## 2016-08-01 MED ORDER — ONDANSETRON HCL 4 MG/2ML IJ SOLN
INTRAMUSCULAR | Status: DC | PRN
  Administered 2016-08-01: 4 mg via INTRAVENOUS

## 2016-08-01 MED ORDER — GLYCOPYRROLATE 0.2 MG/ML IJ SOLN
INTRAMUSCULAR | Status: DC | PRN
  Administered 2016-08-01 (×2): 0.1 mg via INTRAVENOUS

## 2016-08-01 MED ORDER — GLYCOPYRROLATE 0.2 MG/ML IJ SOLN
INTRAMUSCULAR | Status: AC
  Filled 2016-08-01: qty 1

## 2016-08-01 SURGICAL SUPPLY — 63 items
BAG COUNTER SPONGE EZ (MISCELLANEOUS) IMPLANT
BIT DRILL 4.3 (BIT) ×3 IMPLANT
BIT DRILL 4.3MM (BIT) ×1
BIT DRILL 4.3X300MM (BIT) ×1 IMPLANT
CABLE CERLAGE W/CRIMP 1.8 (Cable) IMPLANT
CABLE CERLAGE W/CRIMP 1.8MM (Cable) IMPLANT
CANISTER SUCT 1200ML W/VALVE (MISCELLANEOUS) ×3 IMPLANT
CAP LOCK NCB (Cap) ×15 IMPLANT
CHLORAPREP W/TINT 26ML (MISCELLANEOUS) IMPLANT
COUNTER SPONGE BAG EZ (MISCELLANEOUS)
DRAPE C-ARM XRAY 36X54 (DRAPES) ×3 IMPLANT
DRAPE C-ARMOR (DRAPES) ×3 IMPLANT
DRILL BIT 4.3 (BIT) ×2
DRSG AQUACEL AG ADV 3.5X10 (GAUZE/BANDAGES/DRESSINGS) IMPLANT
DURAPREP 26ML APPLICATOR (WOUND CARE) ×6 IMPLANT
ELECT REM PT RETURN 9FT ADLT (ELECTROSURGICAL) ×3
ELECTRODE REM PT RTRN 9FT ADLT (ELECTROSURGICAL) ×1 IMPLANT
GAUZE PETRO XEROFOAM 1X8 (MISCELLANEOUS) IMPLANT
GAUZE SPONGE 4X4 12PLY STRL (GAUZE/BANDAGES/DRESSINGS) IMPLANT
GLOVE BIO SURGEON STRL SZ7 (GLOVE) ×6 IMPLANT
GLOVE BIO SURGEON STRL SZ8 (GLOVE) IMPLANT
GLOVE BIOGEL M SZ8.5 STRL (GLOVE) ×6 IMPLANT
GLOVE BIOGEL PI IND STRL 9 (GLOVE) ×1 IMPLANT
GLOVE BIOGEL PI INDICATOR 9 (GLOVE) ×2
GLOVE SURG ORTHO 8.5 STRL (GLOVE) IMPLANT
GLOVE SURG ORTHO 9.0 STRL STRW (GLOVE) ×9 IMPLANT
GLOVE SURG XRAY 8.5 LX (GLOVE) IMPLANT
GOWN STRL REUS W/ TWL LRG LVL3 (GOWN DISPOSABLE) ×1 IMPLANT
GOWN STRL REUS W/TWL LRG LVL3 (GOWN DISPOSABLE) ×2
GOWN STRL REUS W/TWL LRG LVL4 (GOWN DISPOSABLE) IMPLANT
GOWN SURG XXL (GOWNS) ×3 IMPLANT
HEMOVAC 400CC 10FR (MISCELLANEOUS) IMPLANT
K-WIRE 2.0 (WIRE) ×10
K-WIRE FXSTD 280X2XNS SS (WIRE) ×5
KIT RM TURNOVER STRD PROC AR (KITS) ×3 IMPLANT
KWIRE FXSTD 280X2XNS SS (WIRE) ×5 IMPLANT
LOCKPLATE CABLE BUTTON NCP HIP (Orthopedic Implant) IMPLANT
MAT BLUE FLOOR 46X72 FLO (MISCELLANEOUS) IMPLANT
NEEDLE FILTER BLUNT 18X 1/2SAF (NEEDLE) ×2
NEEDLE FILTER BLUNT 18X1 1/2 (NEEDLE) ×1 IMPLANT
NEEDLE SPNL 18GX3.5 QUINCKE PK (NEEDLE) IMPLANT
NS IRRIG 1000ML POUR BTL (IV SOLUTION) ×3 IMPLANT
PACK HIP PROSTHESIS (MISCELLANEOUS) ×3 IMPLANT
PLATE NCB 15H HIP (Plate) ×3 IMPLANT
SCREW 5.0 80MM (Screw) ×6 IMPLANT
SCREW CORTICAL NCB 5.0X40 (Screw) ×3 IMPLANT
SCREW CORTICAL NCB 5.0X65 (Screw) ×3 IMPLANT
SCREW NCB 3.5X75X5X6.2XST (Screw) ×2 IMPLANT
SCREW NCB 5.0X38 (Screw) ×9 IMPLANT
SCREW NCB 5.0X75MM (Screw) ×4 IMPLANT
SOL PREP PVP 2OZ (MISCELLANEOUS)
SOLUTION PREP PVP 2OZ (MISCELLANEOUS) IMPLANT
SPONGE LAP 18X18 5 PK (GAUZE/BANDAGES/DRESSINGS) IMPLANT
STAPLER SKIN PROX 35W (STAPLE) ×3 IMPLANT
STOCKINETTE BIAS CUT 6 980064 (GAUZE/BANDAGES/DRESSINGS) IMPLANT
SUT QUILL 0 20X36 (SUTURE) IMPLANT
SUT STEEL 7 (SUTURE) IMPLANT
SUT VIC AB 0 CT1 36 (SUTURE) ×3 IMPLANT
SUT VIC AB 2-0 CT1 27 (SUTURE) ×4
SUT VIC AB 2-0 CT1 TAPERPNT 27 (SUTURE) ×2 IMPLANT
SYR 30ML LL (SYRINGE) ×3 IMPLANT
SYR 5ML LL (SYRINGE) IMPLANT
TOWEL OR 17X26 4PK STRL BLUE (TOWEL DISPOSABLE) ×3 IMPLANT

## 2016-08-01 NOTE — Anesthesia Post-op Follow-up Note (Cosign Needed)
Anesthesia QCDR form completed.        

## 2016-08-01 NOTE — Clinical Social Work Placement (Signed)
   CLINICAL SOCIAL WORK PLACEMENT  NOTE  Date:  08/01/2016  Patient Details  Name: Melinda Hughes MRN: [id number] Date of Birth: Jun 04, 1931  Clinical Social Work is seeking post-discharge placement for this patient at the [place] level of care (*CSW will initial, date and re-position this form in  chart as items are completed):  Yes   Patient/family provided with [place] Work Department's list of facilities offering this level of care within the geographic area requested by the patient (or if unable, by the patient's family).  Yes   Patient/family informed of their freedom to choose among providers that offer the needed level of care, that participate in Medicare, Medicaid or managed care program needed by the patient, have an available bed and are willing to accept the patient.  Yes   Patient/family informed of [facility]'s ownership interest in [facility] and [facility], as well as of the fact that they are under no obligation to receive care at these facilities.  PASRR submitted to EDS on 08/01/16     PASRR number received on       Existing PASRR number confirmed on       FL2 transmitted to all facilities in geographic area requested by pt/family on 08/01/16     FL2 transmitted to all facilities within larger geographic area on       Patient informed that his/her managed care company has contracts with or will negotiate with certain facilities, including the following:            Patient/family informed of bed offers received.  Patient chooses bed at       Physician recommends and patient chooses bed at      Patient to be transferred to   on  .  Patient to be transferred to facility by       Patient family notified on   of transfer.  Name of family member notified:        PHYSICIAN       Additional Comment:    _______________________________________________ [name], [name], LCSW 08/01/2016, 3:11 PM

## 2016-08-01 NOTE — Progress Notes (Signed)
SLP Cancellation Note  Patient Details Name: Melinda Hughes MRN: [id number] DOB: 04/21/1931   Cancelled treatment:       Reason Eval/Treat Not Completed: Patient at procedure or test/unavailable (chart reviewed; NSG consulted).  Pt is currently NPO awaiting Orthopedic surgery today. ST services will f/u w/ BSE w/ pt once cleared for oral intake post surgery. Recommended ongoing oral care for hygiene and oral stimulation. NSG agreed.   Orinda Kenner, [place], CCC-SLP Watson,Katherine 08/01/2016, 8:46 AM

## 2016-08-01 NOTE — Progress Notes (Signed)
[facility] [place] NAME: Melinda Hughes    MR#:  [id number]  DATE OF BIRTH:  [birth year]  SUBJECTIVE:  CHIEF COMPLAINT:   Chief Complaint  Patient presents with  . Fall     S/p femur fracture and ORIF 08/01/16, seen post op- drowsy. REVIEW OF SYSTEMS:   Due to dementia and drowsiness she can not give ROS.  ROS  DRUG ALLERGIES:   Allergies  Allergen Reactions  . Codeine Itching  . Fluzone [Flu Virus Vaccine]     Unknown reaction. Received info from nursing home  . Morphine And Related Itching  . Sulfa Antibiotics Itching    VITALS:  Blood pressure (!) 165/71, pulse 76, temperature 98.4 F (36.9 C), temperature source Axillary, resp. rate 18, height 5\' 5"  (1.651 m), weight 71.7 kg (158 lb), SpO2 96 %.  PHYSICAL EXAMINATION:  GENERAL:  83 y.o.-year-old patient lying in the bed with no acute distress.  EYES: Pupils equal, round, reactive to light and accommodation. No scleral icterus. Extraocular muscles intact.  HEENT: Head atraumatic, normocephalic. Oropharynx and nasopharynx clear.  NECK:  Supple, no jugular venous distention. No thyroid enlargement, no tenderness.  LUNGS: Normal breath sounds bilaterally, no wheezing, rales,rhonchi or crepitation. No use of accessory muscles of respiration.  CARDIOVASCULAR: S1, S2 normal. No murmurs, rubs, or gallops.  ABDOMEN: Soft, nontender, nondistended. Bowel sounds present. No organomegaly or mass. Foley in place,. EXTREMITIES: No pedal edema, cyanosis, or clubbing. On left thigh dressing present. NEUROLOGIC: pt is drowsy, but moves upper limbs on arousal. PSYCHIATRIC: The patient is drowsy  SKIN: No obvious rash, lesion, or ulcer.   Physical Exam LABORATORY PANEL:   CBC  Recent Labs Lab 08/01/16 0316  WBC 7.1  HGB 10.0*  HCT 29.9*  PLT 170   ------------------------------------------------------------------------------------------------------------------  Chemistries   Recent  Labs Lab 07/31/16 1148 08/01/16 0316  NA 137 139  K 4.0 3.8  CL 104 105  CO2 27 27  GLUCOSE 175* 139*  BUN 17 15  CREATININE 0.70 0.73  CALCIUM 9.1 8.7*  AST 23  --   ALT 13*  --   ALKPHOS 72  --   BILITOT 0.8  --    ------------------------------------------------------------------------------------------------------------------  Cardiac Enzymes No results for input(s): TROPONINI in the last 168 hours. ------------------------------------------------------------------------------------------------------------------  RADIOLOGY:  Dg Chest 1 View  Result Date: 07/31/2016 CLINICAL DATA:  Status post fall. History of hypertension, diabetes, uterine malignancy, nonsmoker. EXAM: CHEST 1 VIEW COMPARISON:  Portable chest x-ray of Jul 21, 2016 FINDINGS: The lungs are adequately inflated. There is no focal infiltrate. There are nodules in the lower lung zones bilaterally not appreciably changed since the previous study. The heart and pulmonary vascularity are normal. There is calcification in the wall of the aortic arch. There is mild multilevel degenerative disc disease of the thoracic spine. There degenerative changes of both shoulders. IMPRESSION: No evidence of CHF nor pneumonia. Stable nodules in the lower lungs bilaterally. A PA and lateral chest x-ray is recommended if the patient can undergo the procedure. Thoracic aortic atherosclerosis. Electronically Signed   By: David  Martinique M.D.   On: 07/31/2016 13:12   Ct Knee Left Wo Contrast  Result Date: 07/31/2016 CLINICAL DATA:  Left knee pain after fall on Saturday of last week. Unable to weightbear. EXAM: CT OF THE LEFT KNEE WITHOUT CONTRAST TECHNIQUE: Multidetector CT imaging of the left knee was performed according to the standard protocol. Multiplanar CT image reconstructions were also generated. COMPARISON:  07/31/2016 radiographs of the left femur FINDINGS: Bones/Joint/Cartilage Acute, closed, comminuted supracondylar coronal oblique  fracture of the distal left femoral diaphysis and metaphysis with sagittal intercondylar component is noted. Slight dorsal displacement of the distal fracture fragment and condyles relative to the distal femoral diaphysis and metaphysis up to 1 cm at its proximal fracture margin. No fracture of the patella nor tibial plateau. Osteoarthritic subchondral sclerosis is noted along the weight-bearing portion of the medial femoral condyles. Ligaments Suboptimally assessed by CT. Muscles and Tendons Intact extensor mechanism tendons. No intramuscular hemorrhage or atrophy. Soft tissues There is a small to moderate joint effusion with soft tissue induration along the lateral aspect of the knee. IMPRESSION: 1. Acute, closed, comminuted supracondylar coronal oblique fracture of the distal left femoral diaphysis and metaphysis with sagittal intercondylar component extending into the knee joint is noted. Slight dorsal displacement of the distal fracture fragment and condyles relative to the distal femoral diaphysis and metaphysis up to 1 cm at its proximal fracture margin. 2. No fracture of the tibia nor fibula. 3. Small joint effusion with periarticular soft tissue swelling more so along the lateral aspect. Electronically Signed   By: Ashley Royalty M.D.   On: 07/31/2016 17:48   Dg C-arm 61-120 Min  Result Date: 08/01/2016 CLINICAL DATA:  Surgery for distal femur fracture. EXAM: DG C-ARM 61-120 MIN; LEFT FEMUR 2 VIEWS COMPARISON:  None. FINDINGS: Portable images via C-arm radiography obtained in the operating room show open reduction and internal fixation of distal femur fracture. There is been placement of a screw and plate fixation device with reduction of the fracture fragments. IMPRESSION: 1. Status post ORIF of distal femur fracture. Electronically Signed   By: Kerby Moors M.D.   On: 08/01/2016 14:22   Dg Femur Min 2 Views Left  Result Date: 08/01/2016 CLINICAL DATA:  Initial evaluation status post ORIF of left  femoral fracture. EXAM: LEFT FEMUR 2 VIEWS COMPARISON:  Prior intraoperative views from earlier the same day. FINDINGS: Postoperative changes from recent ORIF of distal left femoral fracture seen. Lateral plate screw fixation has been placed at the left femur. No complication. Comminuted distal left femoral fracture in gross anatomic alignment. Left hip arthroplasty in place. Surgical drain in place with the lateral left thigh. Overlying skin staples noted. IMPRESSION: Sequelae of ORIF for distal left femoral fracture. Electronically Signed   By: Jeannine Boga M.D.   On: 08/01/2016 15:49   Dg Femur Min 2 Views Left  Result Date: 08/01/2016 CLINICAL DATA:  Surgery for distal femur fracture. EXAM: DG C-ARM 61-120 MIN; LEFT FEMUR 2 VIEWS COMPARISON:  None. FINDINGS: Portable images via C-arm radiography obtained in the operating room show open reduction and internal fixation of distal femur fracture. There is been placement of a screw and plate fixation device with reduction of the fracture fragments. IMPRESSION: 1. Status post ORIF of distal femur fracture. Electronically Signed   By: Kerby Moors M.D.   On: 08/01/2016 14:22   Dg Femur Min 2 Views Left  Result Date: 07/31/2016 CLINICAL DATA:  Fall a couple of days ago, patient has dementia, unable to communicate, held by techs for images EXAM: LEFT FEMUR 2 VIEWS COMPARISON:  None. FINDINGS: Prior left hip replacement. There is a comminuted mildly displaced fracture noted through the distal metaphysis and likely entering the knee joint in the region of the femoral notch. No subluxation or dislocation. IMPRESSION: Comminuted, mildly displaced distal left femoral fracture. Electronically Signed   By: Rolm Baptise M.D.  On: 07/31/2016 13:18   Dg Femur, Min 2 Views Right  Result Date: 07/31/2016 CLINICAL DATA:  Fall, pain EXAM: RIGHT FEMUR 2 VIEWS COMPARISON:  None. FINDINGS: Advanced degenerative changes in the right knee. Mild degenerative changes  in the right hip. No acute bony abnormality. Specifically, no fracture, subluxation, or dislocation. Soft tissues are intact. No joint effusion. IMPRESSION: No acute bony abnormality. Electronically Signed   By: Rolm Baptise M.D.   On: 07/31/2016 13:18    ASSESSMENT AND PLAN:   Active Problems:   Closed left subtrochanteric femur fracture, initial encounter ([place])  * Left femur fracture   S/p ORIF, manage per ortho.   Rehab.  * Hypertension   Under control now.  * DM   Stable on ISS.  * dementia   Cont seroquel.     All the records are reviewed and case discussed with Care Management/Social Workerr. Management plans discussed with the patient, family and they are in agreement.  CODE STATUS: Full.  TOTAL TIME TAKING CARE OF THIS PATIENT: 30 minutes.     POSSIBLE D/C IN 1-2 DAYS, DEPENDING ON CLINICAL CONDITION.   Vaughan Basta M.D on 08/01/2016   Between 7am to 6pm - Pager - [phone]  After 6pm go to [email or web address] - password [email or web address] [facility]  Office  [phone]  CC: Primary care physician; Kirk Ruths, MD  Note: This dictation was prepared with Dragon dictation along with smaller phrase technology. Any transcriptional errors that result from this process are unintentional.

## 2016-08-01 NOTE — Anesthesia Preprocedure Evaluation (Signed)
Anesthesia Evaluation  Patient identified by MRN, date of birth, ID band Patient awake    Reviewed: Allergy & Precautions, NPO status , Patient's Chart, lab work & pertinent test results  History of Anesthesia Complications Negative for: history of anesthetic complications  Airway Mallampati: II  TM Distance: >3 FB Neck ROM: Full    Dental no notable dental hx.    Pulmonary neg pulmonary ROS, neg sleep apnea, neg COPD,    breath sounds clear to auscultation- rhonchi (-) wheezing      Cardiovascular hypertension, Pt. on medications (-) CAD and (-) Past MI  Rhythm:Regular Rate:Normal - Systolic murmurs and - Diastolic murmurs    Neuro/Psych Severe dementia  negative psych ROS   GI/Hepatic negative GI ROS, Neg liver ROS,   Endo/Other  diabetes (diet controlled)  Renal/GU negative Renal ROS     Musculoskeletal  (+) Arthritis ,   Abdominal (+) - obese,   Peds  Hematology negative hematology ROS (+)   Anesthesia Other Findings Past Medical History: No date: Arthritis No date: Dementia No date: Diabetes mellitus without complication (HCC) No date: Hypertension No date: Uterine cancer ([place])   Reproductive/Obstetrics                             Anesthesia Physical Anesthesia Plan  ASA: II  Anesthesia Plan: Spinal   Post-op Pain Management:    Induction:   Airway Management Planned: Natural Airway  Additional Equipment:   Intra-op Plan:   Post-operative Plan:   Informed Consent: I have reviewed the patients History and Physical, chart, labs and discussed the procedure including the risks, benefits and alternatives for the proposed anesthesia with the patient or authorized representative who has indicated his/her understanding and acceptance.   Dental advisory given  Plan Discussed with: Anesthesiologist and CRNA  Anesthesia Plan Comments:         Lab Results   Component Value Date   WBC 7.1 08/01/2016   HGB 10.0 (L) 08/01/2016   HCT 29.9 (L) 08/01/2016   MCV 84.1 08/01/2016   PLT 170 08/01/2016    Anesthesia Quick Evaluation

## 2016-08-01 NOTE — Op Note (Signed)
07/31/2016 - 08/01/2016  3:21 PM  PATIENT:  Melinda Hughes    PRE-OPERATIVE DIAGNOSIS:  Left DISTAL FEMUR FRACTURE  POST-OPERATIVE DIAGNOSIS:  Same  PROCEDURE:  OPEN REDUCTION INTERNAL FIXATION (ORIF) DISTAL FEMUR FRACTURE  SURGEON:  Thornton Park, MD  ANESTHESIA:   Spinal  PREOPERATIVE INDICATIONS:  Melinda Hughes is a  83 y.o. female with a diagnosis of Left DISTAL FEMUR FRACTURE.  I discussed treatment options with the family including nonoperative versus operative treatment. They elected for operative management.  I discussed the risks and benefits of surgery. The risks include but are not limited to infection, bleeding requiring blood transfusion, nerve or blood vessel injury, joint stiffness or loss of motion, persistent pain, weakness or instability, malunion, nonunion and hardware failure and the need for further surgery. Medical risks include but are not limited to DVT and pulmonary embolism, myocardial infarction, stroke, pneumonia, respiratory failure and death. Patient understood these risks and wished to proceed.    OPERATIVE IMPLANTS: Zimmer NCB periprosthetic femoral plate  OPERATIVE FINDINGS: Comminuted left distal femur fracture with intercondylar extension  OPERATIVE PROCEDURE: Patient was met in the preoperative area. Her family is at the bedside. The patient has profound dementia. Her husband and daughter have consented to surgery. Patient had her left lower extremity marked with my initials and the word yes according the hospital's correct surgical site protocol.  Patient was brought to the operating room. She was placed supine on the operative table after undergoing placement of spinal anesthetic. A Foley catheter was placed. Patient is a prepped and draped in a sterile fashion. A timeout was performed to verify the patient's name, date of birth, medical record number, correct site of surgery and correct procedure to be performed. His also used to verify the patient received  antibiotics that all appropriate instruments, implants and radiographic studies were available in the room. Once all in attendance were in agreement, the case began. The patient received 2 g of Ancef prior to the onset of the case.  C-arm images were taken of the fracture after the left lower extremity was placed over a triangle. Slight traction and manual reduction was performed to align the fracture appropriately. A long lateral incision was then made along the femur extending from the knee to approximately three quarters of the way up her thigh.  The key elevator was used to elevate the subcutaneous tissue off the deep fascia. A deep blade was then used to incise the fascia exposing the underlying vastus lateralis muscle. A Zimmer NCB periprosthetic femoral plate was then placed using a submuscular approach.  A large fracture reduction clamp was used to provide compression between the medial and lateral condyles of the femur.  The fracture reduction clamp was placed over top of plate laterally.  The plate was provisionally fixed with K wires. C-arm images of the plate placement were performed. Once the plate was deemed in adequate position, it was fixed distally with bicortical screws into the condyles.  5 screws were placed across the condyles.  The attention was then turned to proximal fixation. A bridge plating technique was used spanning the extensive comminution in the metaphysis.  4 bicortical screws were then placed in the proximal femoral shaft.  The plate was long enough to overlap the hip prosthesis by approximately 4-6 cm.  Once all screws were in place final C-arm images of the construct were taken.  The wound was copiously irrigated.    The deep fascia was then closed with interrupted 0 Vicryl sutures.  The wound was then again copiously irrigated. Hemovac drain was placed exiting distally. The subcutaneous tissue was closed in 2 layers both with 0 and 2-0 Vicryl. The skin was approximated with  staples. A dry sterile dressing was applied.  I was scrubbed and present the entire case and all sharp and instrument counts were correct at the conclusion the case.  I spoke with the patient's family in the postop consultation room to let them know the case had been performed without complication and the patient was stable in the recovery room.

## 2016-08-01 NOTE — Transfer of Care (Signed)
Immediate Anesthesia Transfer of Care Note  Patient: Melinda Hughes  Procedure(s) Performed: Procedure(s): OPEN REDUCTION INTERNAL FIXATION (ORIF) DISTAL FEMUR FRACTURE (Left)  Patient Location: PACU  Anesthesia Type:Spinal  Level of Consciousness: drowsy and responds to stimulation  Airway & Oxygen Therapy: Patient Spontanous Breathing and Patient connected to face mask oxygen  Post-op Assessment: Report given to RN and Post -op Vital signs reviewed and stable  Post vital signs: Reviewed and stable  Last Vitals:  Vitals:   08/01/16 1028 08/01/16 1036  BP:  (!) 136/116  Pulse: 85   Resp: 18   Temp: 36.4 C     Last Pain:  Vitals:   08/01/16 1028  TempSrc: Tympanic         Complications: No apparent anesthesia complications

## 2016-08-01 NOTE — Progress Notes (Signed)
Subjective:  Patient seen in pre-op with family at the bedside.  Patient with dementia and can't provide history.    Objective:   VITALS:   Vitals:   08/01/16 0658 08/01/16 0738 08/01/16 1028 08/01/16 1036  BP: (!) 117/92 (!) 110/94  (!) 136/116  Pulse: 63 93 85   Resp:   18   Temp:  98.9 F (37.2 C) 97.6 F (36.4 C)   TempSrc:  Oral Tympanic   SpO2:  98% 96%   Weight:   71.7 kg (158 lb)   Height:   5\' 5"  (1.651 m)     PHYSICAL EXAM:  Patient appears comfortable.  Skin over the left lower extremity remains intact.  Pedal pulses are palpable. Motor/sensory function can't be assessed.  LABS  Results for orders placed or performed during the hospital encounter of 07/31/16 (from the past 24 hour(s))  CBC with Differential/Platelet     Status: Abnormal   Collection Time: 07/31/16 11:48 AM  Result Value Ref Range   WBC 8.8 3.6 - 11.0 K/uL   RBC 4.05 3.80 - 5.20 MIL/uL   Hemoglobin 11.5 (L) 12.0 - 16.0 g/dL   HCT 34.4 (L) 35.0 - 47.0 %   MCV 84.8 80.0 - 100.0 fL   MCH 28.3 26.0 - 34.0 pg   MCHC 33.4 32.0 - 36.0 g/dL   RDW 15.3 (H) 11.5 - 14.5 %   Platelets 187 150 - 440 K/uL   Neutrophils Relative % 88 %   Neutro Abs 7.7 (H) 1.4 - 6.5 K/uL   Lymphocytes Relative 8 %   Lymphs Abs 0.7 (L) 1.0 - 3.6 K/uL   Monocytes Relative 4 %   Monocytes Absolute 0.4 0.2 - 0.9 K/uL   Eosinophils Relative 0 %   Eosinophils Absolute 0.0 0 - 0.7 K/uL   Basophils Relative 0 %   Basophils Absolute 0.0 0 - 0.1 K/uL  Comprehensive metabolic panel     Status: Abnormal   Collection Time: 07/31/16 11:48 AM  Result Value Ref Range   Sodium 137 135 - 145 mmol/L   Potassium 4.0 3.5 - 5.1 mmol/L   Chloride 104 101 - 111 mmol/L   CO2 27 22 - 32 mmol/L   Glucose, Bld 175 (H) 65 - 99 mg/dL   BUN 17 6 - 20 mg/dL   Creatinine, Ser 0.70 0.44 - 1.00 mg/dL   Calcium 9.1 8.9 - 10.3 mg/dL   Total Protein 7.2 6.5 - 8.1 g/dL   Albumin 3.9 3.5 - 5.0 g/dL   AST 23 15 - 41 U/L   ALT 13 (L) 14 - 54 U/L    Alkaline Phosphatase 72 38 - 126 U/L   Total Bilirubin 0.8 0.3 - 1.2 mg/dL   GFR calc non Af Amer >60 >60 mL/min   GFR calc Af Amer >60 >60 mL/min   Anion gap 6 5 - 15  MRSA PCR Screening     Status: None   Collection Time: 07/31/16  4:52 PM  Result Value Ref Range   MRSA by PCR NEGATIVE NEGATIVE  Protime-INR     Status: None   Collection Time: 07/31/16  6:40 PM  Result Value Ref Range   Prothrombin Time 14.6 11.4 - 15.2 seconds   INR 1.13   APTT     Status: None   Collection Time: 07/31/16  6:40 PM  Result Value Ref Range   aPTT 32 24 - 36 seconds  Glucose, capillary     Status: Abnormal   Collection  Time: 07/31/16  9:12 PM  Result Value Ref Range   Glucose-Capillary 102 (H) 65 - 99 mg/dL   Comment 1 Notify RN   Basic metabolic panel     Status: Abnormal   Collection Time: 08/01/16  3:16 AM  Result Value Ref Range   Sodium 139 135 - 145 mmol/L   Potassium 3.8 3.5 - 5.1 mmol/L   Chloride 105 101 - 111 mmol/L   CO2 27 22 - 32 mmol/L   Glucose, Bld 139 (H) 65 - 99 mg/dL   BUN 15 6 - 20 mg/dL   Creatinine, Ser 0.73 0.44 - 1.00 mg/dL   Calcium 8.7 (L) 8.9 - 10.3 mg/dL   GFR calc non Af Amer >60 >60 mL/min   GFR calc Af Amer >60 >60 mL/min   Anion gap 7 5 - 15  CBC     Status: Abnormal   Collection Time: 08/01/16  3:16 AM  Result Value Ref Range   WBC 7.1 3.6 - 11.0 K/uL   RBC 3.55 (L) 3.80 - 5.20 MIL/uL   Hemoglobin 10.0 (L) 12.0 - 16.0 g/dL   HCT 29.9 (L) 35.0 - 47.0 %   MCV 84.1 80.0 - 100.0 fL   MCH 28.2 26.0 - 34.0 pg   MCHC 33.6 32.0 - 36.0 g/dL   RDW 15.2 (H) 11.5 - 14.5 %   Platelets 170 150 - 440 K/uL  Glucose, capillary     Status: Abnormal   Collection Time: 08/01/16  7:35 AM  Result Value Ref Range   Glucose-Capillary 127 (H) 65 - 99 mg/dL  Glucose, capillary     Status: Abnormal   Collection Time: 08/01/16 10:29 AM  Result Value Ref Range   Glucose-Capillary 110 (H) 65 - 99 mg/dL    Dg Chest 1 View  Result Date: 07/31/2016 CLINICAL DATA:  Status  post fall. History of hypertension, diabetes, uterine malignancy, nonsmoker. EXAM: CHEST 1 VIEW COMPARISON:  Portable chest x-ray of Jul 21, 2016 FINDINGS: The lungs are adequately inflated. There is no focal infiltrate. There are nodules in the lower lung zones bilaterally not appreciably changed since the previous study. The heart and pulmonary vascularity are normal. There is calcification in the wall of the aortic arch. There is mild multilevel degenerative disc disease of the thoracic spine. There degenerative changes of both shoulders. IMPRESSION: No evidence of CHF nor pneumonia. Stable nodules in the lower lungs bilaterally. A PA and lateral chest x-ray is recommended if the patient can undergo the procedure. Thoracic aortic atherosclerosis. Electronically Signed   By: David  Martinique M.D.   On: 07/31/2016 13:12   Ct Knee Left Wo Contrast  Result Date: 07/31/2016 CLINICAL DATA:  Left knee pain after fall on Saturday of last week. Unable to weightbear. EXAM: CT OF THE LEFT KNEE WITHOUT CONTRAST TECHNIQUE: Multidetector CT imaging of the left knee was performed according to the standard protocol. Multiplanar CT image reconstructions were also generated. COMPARISON:  07/31/2016 radiographs of the left femur FINDINGS: Bones/Joint/Cartilage Acute, closed, comminuted supracondylar coronal oblique fracture of the distal left femoral diaphysis and metaphysis with sagittal intercondylar component is noted. Slight dorsal displacement of the distal fracture fragment and condyles relative to the distal femoral diaphysis and metaphysis up to 1 cm at its proximal fracture margin. No fracture of the patella nor tibial plateau. Osteoarthritic subchondral sclerosis is noted along the weight-bearing portion of the medial femoral condyles. Ligaments Suboptimally assessed by CT. Muscles and Tendons Intact extensor mechanism tendons. No intramuscular hemorrhage or  atrophy. Soft tissues There is a small to moderate joint  effusion with soft tissue induration along the lateral aspect of the knee. IMPRESSION: 1. Acute, closed, comminuted supracondylar coronal oblique fracture of the distal left femoral diaphysis and metaphysis with sagittal intercondylar component extending into the knee joint is noted. Slight dorsal displacement of the distal fracture fragment and condyles relative to the distal femoral diaphysis and metaphysis up to 1 cm at its proximal fracture margin. 2. No fracture of the tibia nor fibula. 3. Small joint effusion with periarticular soft tissue swelling more so along the lateral aspect. Electronically Signed   By: Ashley Royalty M.D.   On: 07/31/2016 17:48   Dg Femur Min 2 Views Left  Result Date: 07/31/2016 CLINICAL DATA:  Fall a couple of days ago, patient has dementia, unable to communicate, held by techs for images EXAM: LEFT FEMUR 2 VIEWS COMPARISON:  None. FINDINGS: Prior left hip replacement. There is a comminuted mildly displaced fracture noted through the distal metaphysis and likely entering the knee joint in the region of the femoral notch. No subluxation or dislocation. IMPRESSION: Comminuted, mildly displaced distal left femoral fracture. Electronically Signed   By: Rolm Baptise M.D.   On: 07/31/2016 13:18   Dg Femur, Min 2 Views Right  Result Date: 07/31/2016 CLINICAL DATA:  Fall, pain EXAM: RIGHT FEMUR 2 VIEWS COMPARISON:  None. FINDINGS: Advanced degenerative changes in the right knee. Mild degenerative changes in the right hip. No acute bony abnormality. Specifically, no fracture, subluxation, or dislocation. Soft tissues are intact. No joint effusion. IMPRESSION: No acute bony abnormality. Electronically Signed   By: Rolm Baptise M.D.   On: 07/31/2016 13:18    Assessment/Plan: Day of Surgery   Active Problems:   Closed left subtrochanteric femur fracture, initial encounter [facility])  Plan for surgery today for ORIF of left distal femur.  I answered all questions by the patient's family.   Patient has been cleared for surgery.    Thornton Park , MD 08/01/2016, 11:16 AM

## 2016-08-01 NOTE — Progress Notes (Signed)
Subjective:  POST OP CHECK:  Patient is sleeping comfortably. Patient with profound dementia. She is unable to provide a history of baseline and therefore was not awoken.  Objective:   VITALS:   Vitals:   08/01/16 1545 08/01/16 1605 08/01/16 1609 08/01/16 1631  BP: (!) 100/32 (!) 132/58  (!) 160/55  Pulse: 77 77  85  Resp: 18 18    Temp: 98.1 F (36.7 C) 98.7 F (37.1 C)    TempSrc:  Oral    SpO2: 98% 93% 93%   Weight:      Height:        PHYSICAL EXAM:  Right lower extremity:  She with knee immobilizer in place. Dressing is clean dry and intact. Hemovac drain currently with minimal output.  350 cc of output has been recorded.   LABS  Results for orders placed or performed during the hospital encounter of 07/31/16 (from the past 24 hour(s))  Protime-INR     Status: None   Collection Time: 07/31/16  6:40 PM  Result Value Ref Range   Prothrombin Time 14.6 11.4 - 15.2 seconds   INR 1.13   APTT     Status: None   Collection Time: 07/31/16  6:40 PM  Result Value Ref Range   aPTT 32 24 - 36 seconds  Glucose, capillary     Status: Abnormal   Collection Time: 07/31/16  9:12 PM  Result Value Ref Range   Glucose-Capillary 102 (H) 65 - 99 mg/dL   Comment 1 Notify RN   Basic metabolic panel     Status: Abnormal   Collection Time: 08/01/16  3:16 AM  Result Value Ref Range   Sodium 139 135 - 145 mmol/L   Potassium 3.8 3.5 - 5.1 mmol/L   Chloride 105 101 - 111 mmol/L   CO2 27 22 - 32 mmol/L   Glucose, Bld 139 (H) 65 - 99 mg/dL   BUN 15 6 - 20 mg/dL   Creatinine, Ser 0.73 0.44 - 1.00 mg/dL   Calcium 8.7 (L) 8.9 - 10.3 mg/dL   GFR calc non Af Amer >60 >60 mL/min   GFR calc Af Amer >60 >60 mL/min   Anion gap 7 5 - 15  CBC     Status: Abnormal   Collection Time: 08/01/16  3:16 AM  Result Value Ref Range   WBC 7.1 3.6 - 11.0 K/uL   RBC 3.55 (L) 3.80 - 5.20 MIL/uL   Hemoglobin 10.0 (L) 12.0 - 16.0 g/dL   HCT 29.9 (L) 35.0 - 47.0 %   MCV 84.1 80.0 - 100.0 fL   MCH 28.2  26.0 - 34.0 pg   MCHC 33.6 32.0 - 36.0 g/dL   RDW 15.2 (H) 11.5 - 14.5 %   Platelets 170 150 - 440 K/uL  Glucose, capillary     Status: Abnormal   Collection Time: 08/01/16  7:35 AM  Result Value Ref Range   Glucose-Capillary 127 (H) 65 - 99 mg/dL  Glucose, capillary     Status: Abnormal   Collection Time: 08/01/16 10:29 AM  Result Value Ref Range   Glucose-Capillary 110 (H) 65 - 99 mg/dL  Glucose, capillary     Status: None   Collection Time: 08/01/16  3:24 PM  Result Value Ref Range   Glucose-Capillary 84 65 - 99 mg/dL  Glucose, capillary     Status: Abnormal   Collection Time: 08/01/16  4:46 PM  Result Value Ref Range   Glucose-Capillary 109 (H) 65 - 99 mg/dL  Dg Chest 1 View  Result Date: 07/31/2016 CLINICAL DATA:  Status post fall. History of hypertension, diabetes, uterine malignancy, nonsmoker. EXAM: CHEST 1 VIEW COMPARISON:  Portable chest x-ray of Jul 21, 2016 FINDINGS: The lungs are adequately inflated. There is no focal infiltrate. There are nodules in the lower lung zones bilaterally not appreciably changed since the previous study. The heart and pulmonary vascularity are normal. There is calcification in the wall of the aortic arch. There is mild multilevel degenerative disc disease of the thoracic spine. There degenerative changes of both shoulders. IMPRESSION: No evidence of CHF nor pneumonia. Stable nodules in the lower lungs bilaterally. A PA and lateral chest x-ray is recommended if the patient can undergo the procedure. Thoracic aortic atherosclerosis. Electronically Signed   By: David  Martinique M.D.   On: 07/31/2016 13:12   Ct Knee Left Wo Contrast  Result Date: 07/31/2016 CLINICAL DATA:  Left knee pain after fall on Saturday of last week. Unable to weightbear. EXAM: CT OF THE LEFT KNEE WITHOUT CONTRAST TECHNIQUE: Multidetector CT imaging of the left knee was performed according to the standard protocol. Multiplanar CT image reconstructions were also generated.  COMPARISON:  07/31/2016 radiographs of the left femur FINDINGS: Bones/Joint/Cartilage Acute, closed, comminuted supracondylar coronal oblique fracture of the distal left femoral diaphysis and metaphysis with sagittal intercondylar component is noted. Slight dorsal displacement of the distal fracture fragment and condyles relative to the distal femoral diaphysis and metaphysis up to 1 cm at its proximal fracture margin. No fracture of the patella nor tibial plateau. Osteoarthritic subchondral sclerosis is noted along the weight-bearing portion of the medial femoral condyles. Ligaments Suboptimally assessed by CT. Muscles and Tendons Intact extensor mechanism tendons. No intramuscular hemorrhage or atrophy. Soft tissues There is a small to moderate joint effusion with soft tissue induration along the lateral aspect of the knee. IMPRESSION: 1. Acute, closed, comminuted supracondylar coronal oblique fracture of the distal left femoral diaphysis and metaphysis with sagittal intercondylar component extending into the knee joint is noted. Slight dorsal displacement of the distal fracture fragment and condyles relative to the distal femoral diaphysis and metaphysis up to 1 cm at its proximal fracture margin. 2. No fracture of the tibia nor fibula. 3. Small joint effusion with periarticular soft tissue swelling more so along the lateral aspect. Electronically Signed   By: Ashley Royalty M.D.   On: 07/31/2016 17:48   Dg C-arm 61-120 Min  Result Date: 08/01/2016 CLINICAL DATA:  Surgery for distal femur fracture. EXAM: DG C-ARM 61-120 MIN; LEFT FEMUR 2 VIEWS COMPARISON:  None. FINDINGS: Portable images via C-arm radiography obtained in the operating room show open reduction and internal fixation of distal femur fracture. There is been placement of a screw and plate fixation device with reduction of the fracture fragments. IMPRESSION: 1. Status post ORIF of distal femur fracture. Electronically Signed   By: Kerby Moors M.D.    On: 08/01/2016 14:22   Dg Femur Min 2 Views Left  Result Date: 08/01/2016 CLINICAL DATA:  Initial evaluation status post ORIF of left femoral fracture. EXAM: LEFT FEMUR 2 VIEWS COMPARISON:  Prior intraoperative views from earlier the same day. FINDINGS: Postoperative changes from recent ORIF of distal left femoral fracture seen. Lateral plate screw fixation has been placed at the left femur. No complication. Comminuted distal left femoral fracture in gross anatomic alignment. Left hip arthroplasty in place. Surgical drain in place with the lateral left thigh. Overlying skin staples noted. IMPRESSION: Sequelae of ORIF for distal left femoral  fracture. Electronically Signed   By: Jeannine Boga M.D.   On: 08/01/2016 15:49   Dg Femur Min 2 Views Left  Result Date: 08/01/2016 CLINICAL DATA:  Surgery for distal femur fracture. EXAM: DG C-ARM 61-120 MIN; LEFT FEMUR 2 VIEWS COMPARISON:  None. FINDINGS: Portable images via C-arm radiography obtained in the operating room show open reduction and internal fixation of distal femur fracture. There is been placement of a screw and plate fixation device with reduction of the fracture fragments. IMPRESSION: 1. Status post ORIF of distal femur fracture. Electronically Signed   By: Kerby Moors M.D.   On: 08/01/2016 14:22   Dg Femur Min 2 Views Left  Result Date: 07/31/2016 CLINICAL DATA:  Fall a couple of days ago, patient has dementia, unable to communicate, held by techs for images EXAM: LEFT FEMUR 2 VIEWS COMPARISON:  None. FINDINGS: Prior left hip replacement. There is a comminuted mildly displaced fracture noted through the distal metaphysis and likely entering the knee joint in the region of the femoral notch. No subluxation or dislocation. IMPRESSION: Comminuted, mildly displaced distal left femoral fracture. Electronically Signed   By: Rolm Baptise M.D.   On: 07/31/2016 13:18   Dg Femur, Min 2 Views Right  Result Date: 07/31/2016 CLINICAL DATA:   Fall, pain EXAM: RIGHT FEMUR 2 VIEWS COMPARISON:  None. FINDINGS: Advanced degenerative changes in the right knee. Mild degenerative changes in the right hip. No acute bony abnormality. Specifically, no fracture, subluxation, or dislocation. Soft tissues are intact. No joint effusion. IMPRESSION: No acute bony abnormality. Electronically Signed   By: Rolm Baptise M.D.   On: 07/31/2016 13:18    Assessment/Plan: Day of Surgery   Active Problems:   Closed left subtrochanteric femur fracture, initial encounter [facility])  Patient is stable postop. She will receive 24 hours postop antibiotics. She has TED stockings and foot pumps for DVT prophylaxis. She'll begin Lovenox tomorrow. Patient will begin physical therapy tomorrow. Labs will be drawn in the morning.  I reviewed the postoperative x-rays which demonstrate the fracture is well aligned. Hardware is well positioned.    Thornton Park , MD 08/01/2016, 5:44 PM

## 2016-08-01 NOTE — NC FL2 (Signed)
[place] LEVEL OF CARE SCREENING TOOL     IDENTIFICATION  Patient Name: Melinda Hughes Birthdate: 04/19/1931 Sex: female Admission Date (Current Location): 07/31/2016  [facility] and [place] Number:  Engineering geologist and Address:  [facility], [address], [place], [place] [address]      Provider Number: [id number]  Attending Physician Name and Address:  Fritzi Mandes, MD  Relative Name and Phone Number:       Current Level of Care: Hospital Recommended Level of Care: [place] Prior Approval Number:    Date Approved/Denied:   PASRR Number:    Discharge Plan: SNF    Current Diagnoses: Patient Active Problem List   Diagnosis Date Noted  . Closed left subtrochanteric femur fracture, initial encounter ([place]) 07/31/2016  . Sepsis ([place]) 05/15/2016  . Pressure ulcer 10/18/2015  . Acute encephalopathy 10/17/2015    Orientation RESPIRATION BLADDER Height & Weight     Self  Normal Incontinent, Indwelling catheter Weight: 158 lb (71.7 kg) Height:  5\' 5"  (165.1 cm)  BEHAVIORAL SYMPTOMS/MOOD NEUROLOGICAL BOWEL NUTRITION STATUS   (none)  (none) Incontinent Diet (NPO for surgery to be advanced )  AMBULATORY STATUS COMMUNICATION OF NEEDS Skin   Extensive Assist Verbally Surgical wounds (Incision: Left Leg )                       Personal Care Assistance Level of Assistance  Bathing, Feeding, Dressing Bathing Assistance: Limited assistance Feeding assistance: Independent Dressing Assistance: Limited assistance     Functional Limitations Info  Sight, Hearing, Speech Sight Info: Adequate Hearing Info: Adequate Speech Info: Adequate    SPECIAL CARE FACTORS FREQUENCY  PT (By licensed PT), OT (By licensed OT)     PT Frequency:  (5) OT Frequency:  (5)            Contractures      Additional Factors Info  Code Status, Allergies Code Status Info:  (Full Code. ) Allergies Info:  (Codeine, Fluzone Flu Virus  Vaccine, Morphine And Related, Sulfa Antibiotics)           Current Medications (08/01/2016):  This is the current hospital active medication list Current Facility-Administered Medications  Medication Dose Route Frequency Provider Last Rate Last Dose  . 0.9 %  sodium chloride infusion   Intravenous Continuous Epifanio Lesches, MD 75 mL/hr at 08/01/16 2694    . [MAR Hold] acetaminophen (TYLENOL) tablet 650 mg  650 mg Oral Q6H PRN Epifanio Lesches, MD       Or  . Doug Sou Hold] acetaminophen (TYLENOL) suppository 650 mg  650 mg Rectal Q6H PRN Epifanio Lesches, MD      . Doug Sou Hold] bisacodyl (DULCOLAX) EC tablet 5 mg  5 mg Oral Daily PRN Epifanio Lesches, MD      . Doug Sou Hold] docusate sodium (COLACE) capsule 100 mg  100 mg Oral BID Epifanio Lesches, MD      . feeding supplement (ENSURE ENLIVE) (ENSURE ENLIVE) liquid 237 mL  237 mL Oral BID BM Fritzi Mandes, MD      . fentaNYL (SUBLIMAZE) injection 25-50 mcg  25-50 mcg Intravenous Q5 min PRN Penwarden, Amy, MD      . Doug Sou Hold] hydrALAZINE (APRESOLINE) injection 20 mg  20 mg Intravenous Q6H PRN Epifanio Lesches, MD   20 mg at 08/01/16 8546  . [MAR Hold] hydrALAZINE (APRESOLINE) tablet 25 mg  25 mg Oral Q8H Epifanio Lesches, MD      . Doug Sou  Hold] insulin aspart (novoLOG) injection 0-9 Units  0-9 Units Subcutaneous TID WC Epifanio Lesches, MD   1 Units at 08/01/16 0805  . meperidine (DEMEROL) injection 6.25-12.5 mg  6.25-12.5 mg Intravenous Q5 min PRN Penwarden, Amy, MD      . multivitamin with minerals tablet 1 tablet  1 tablet Oral Daily Fritzi Mandes, MD      . Doug Sou Hold] ondansetron [facility]) tablet 4 mg  4 mg Oral Q6H PRN Epifanio Lesches, MD       Or  . Doug Sou Hold] ondansetron (ZOFRAN) injection 4 mg  4 mg Intravenous Q6H PRN Epifanio Lesches, MD      . promethazine (PHENERGAN) injection 6.25-12.5 mg  6.25-12.5 mg Intravenous Q15 min PRN Penwarden, Amy, MD      . Doug Sou Hold] traZODone (DESYREL) tablet 25 mg  25 mg  Oral QHS PRN Epifanio Lesches, MD         Discharge Medications: Please see discharge summary for a list of discharge medications.  Relevant Imaging Results:  Relevant Lab Results:   Additional Information  (SSN: [id number])  Devion Chriscoe, [name], LCSW

## 2016-08-01 NOTE — Anesthesia Procedure Notes (Signed)
Spinal  Patient location during procedure: OR Start time: 08/01/2016 11:30 AM End time: 08/01/2016 11:45 AM Staffing Anesthesiologist: Emmie Niemann Performed: anesthesiologist  Preanesthetic Checklist Completed: patient identified, site marked, surgical consent, pre-op evaluation, timeout performed, IV checked, risks and benefits discussed and monitors and equipment checked Spinal Block Patient position: right lateral decubitus Prep: ChloraPrep Patient monitoring: heart rate, continuous pulse ox, blood pressure and cardiac monitor Approach: midline Location: L4-5 Injection technique: single-shot Needle Needle type: Introducer and Pencil-Tip  Needle gauge: 24 G Needle length: 9 cm Additional Notes Negative paresthesia. Negative blood return. Positive free-flowing CSF. Expiration date of kit checked and confirmed. Patient tolerated procedure well, without complications.

## 2016-08-01 NOTE — Clinical Social Work Note (Addendum)
Clinical Social Work Assessment  Patient Details  Name: Melinda Hughes MRN: [id number] Date of Birth: 02-17-1932  Date of referral:  08/01/16               Reason for consult:  Facility Placement                Permission sought to share information with:  Chartered certified accountant granted to share information::  Yes, Verbal Permission Granted  Name::      [place]::   [place]   Relationship::     Contact Information:     Housing/Transportation Living arrangements for the past 2 months:  [place] of Information:  Adult Children Patient Interpreter Needed:  None Criminal Activity/Legal Involvement Pertinent to Current Situation/Hospitalization:  No - Comment as needed Significant Relationships:  Adult Children Lives with:  Facility Resident Do you feel safe going back to the place where you live?    Need for family participation in patient care:  Yes (Comment)  Care giving concerns:  Patient is a resident at [facility].    Social Worker assessment / plan:  Holiday representative (CSW) reviewed chart and noted that patient is from [place] and has a femur fracture. Per chart patient will have surgery today. CSW met with patient and her daughter Melinda Hughes [phone] was at bedside. Patient did not participate in assessment. Per daughter patient is a resident at [facility] and has dementia. Daughter reported that patient was very mobile before the fall. Per daughter patient is paying privately for [employer]. CSW explained SNF process and that medicare requires a 3 night qualifying inpatient stay at a hospital in order to pay for rehab. Patient was admitted to inpatient on 07/31/16. CSW also explained that patient will have to participate in PT in order for medicare to pay for SNF. Daughter verbalized her understanding and is agreeable to SNF search in [place]. Daughter prefers [facility]. Per daughter patient has been to [facility] in 2015. FL2 complete and faxed out. PASARR is pending. CSW will continue to follow and assist as needed.   Employment status:  Retired Forensic scientist:  Medicare PT Recommendations:  Not assessed at this time [place] / Referral to community resources:  [place]  Patient/Family's Response to care:  Patient's daughter is agreeable to [employer] in [place].   Patient/Family's Understanding of and Emotional Response to Diagnosis, Current Treatment, and Prognosis:  Patient's daughter was very pleasant and thanked CSW for assistance.   Emotional Assessment Appearance:  Appears stated age Attitude/Demeanor/Rapport:  Unable to Assess Affect (typically observed):  Unable to Assess Orientation:  Oriented to Self Alcohol / Substance use:  Not Applicable Psych involvement (Current and /or in the community):  No (Comment)  Discharge Needs  Concerns to be addressed:  Discharge Planning Concerns Readmission within the last 30 days:  No Current discharge risk:  Cognitively Impaired, Dependent with Mobility, Chronically ill Barriers to Discharge:  Continued Medical Work up   [employer], [name], LCSW 08/01/2016, 3:12 PM

## 2016-08-01 NOTE — Progress Notes (Signed)
Initial Nutrition Assessment  DOCUMENTATION CODES:   Not applicable  INTERVENTION:   Ensure Enlive po BID, each supplement provides 350 kcal and 20 grams of protein  MVI  NUTRITION DIAGNOSIS:   Increased nutrient needs related to other (see comment) (femur fracture) as evidenced by increased estimated needs from protein.  GOAL:   Patient will meet greater than or equal to 90% of their needs  MONITOR:   PO intake, Supplement acceptance, Labs, Weight trends  REASON FOR ASSESSMENT:   Low Braden    ASSESSMENT:   83 y.o. female with profound dementia presented from her nursing facility after she was noted not to be weightbearing on the left lower extremity. Pt found to have significantly comminuted distal femur fracture with intercondylar extension.    Unable to see pt today r/t pt in surgery. RD will order Ensure and MVI and assess pt at follow-up visit. Per chart, pt is weight stable. Per chart, pt eating 75% meals that her daughter brings in from home.   Medications reviewed and include: colace, insulin  Labs reviewed: Ca 8.7(L) Hgb 10.0(L), Hct 29.9(L) cbgs- 175, 139 x 24 hrs  Unable to complete Nutrition-Focused physical exam at this time.   Diet Order:  Diet NPO time specified  Skin:  Reviewed, no issues  Last BM:  none since admit  Height:   Ht Readings from Last 1 Encounters:  08/01/16 5\' 5"  (1.651 m)    Weight:   Wt Readings from Last 1 Encounters:  08/01/16 158 lb (71.7 kg)    Ideal Body Weight:  56.8 kg  BMI:  Body mass index is 26.29 kg/m.  Estimated Nutritional Needs:   Kcal:  1500-1800kcal/day   Protein:  79-93g/day   Fluid:  >1.5L/day   EDUCATION NEEDS:   Education needs no appropriate at this time  Koleen Distance MS, RD, LDN Pager #- [phone]

## 2016-08-01 NOTE — Progress Notes (Signed)
Patient arrived back to unit. Family at bedside. LLE immobilizer in place. Ice to operative site. No signs of distress noted. Will continue to monitor.

## 2016-08-02 LAB — BASIC METABOLIC PANEL
Anion gap: 7 (ref 5–15)
BUN: 17 mg/dL (ref 6–20)
CHLORIDE: 110 mmol/L (ref 101–111)
CO2: 25 mmol/L (ref 22–32)
Calcium: 8.2 mg/dL — ABNORMAL LOW (ref 8.9–10.3)
Creatinine, Ser: 0.72 mg/dL (ref 0.44–1.00)
GFR calc non Af Amer: >60 mL/min (ref >60)
Glucose, Bld: 158 mg/dL — ABNORMAL HIGH (ref 65–99)
Potassium: 3.4 mmol/L — ABNORMAL LOW (ref 3.5–5.1)
Sodium: 142 mmol/L (ref 135–145)

## 2016-08-02 LAB — CBC
HEMATOCRIT: 25.8 % — AB (ref 35.0–47.0)
HEMOGLOBIN: 8.5 g/dL — AB (ref 12.0–16.0)
MCH: 28.1 pg (ref 26.0–34.0)
MCHC: 33 g/dL (ref 32.0–36.0)
MCV: 85.1 fL (ref 80.0–100.0)
Platelets: 156 10*3/uL (ref 150–440)
RBC: 3.04 MIL/uL — ABNORMAL LOW (ref 3.80–5.20)
RDW: 14.8 % — ABNORMAL HIGH (ref 11.5–14.5)
WBC: 7.8 10*3/uL (ref 3.6–11.0)

## 2016-08-02 LAB — GLUCOSE, CAPILLARY
GLUCOSE-CAPILLARY: 156 mg/dL — AB (ref 65–99)
Glucose-Capillary: 148 mg/dL — ABNORMAL HIGH (ref 65–99)
Glucose-Capillary: 142 mg/dL — ABNORMAL HIGH (ref 65–99)
Glucose-Capillary: 115 mg/dL — ABNORMAL HIGH (ref 65–99)

## 2016-08-02 NOTE — Progress Notes (Signed)
Pt with elevated temp of 100.9. Tylenol suppository given as patient refuses to eat/drink. SLP eval not completed due to pt sedation, unable to get pt alert enough for swallowing evaluation. Pt has advanced dementia at baseline. Husband at bedside. Unable to get oral meds into patient due to unsafe level of sedation for swallowing. Continuing IV fluids. Will continue to monitor.

## 2016-08-02 NOTE — Progress Notes (Signed)
PT Cancellation Note  Patient Details Name: Melinda Hughes MRN: [id number] DOB: 1932-01-17   Cancelled Treatment:    Reason Eval/Treat Not Completed: Medical issues which prohibited therapy.  Too lethargic and dementia an issue per nsg to see for PT safely.  Will try again tomorrow per nsg.   Ramond Dial 08/02/2016, 10:29 AM   Mee Hives, PT MS Acute Rehab Dept. Number: [place]

## 2016-08-02 NOTE — Progress Notes (Addendum)
[facility] [place] NAME: Melinda Hughes    MR#:  [id number]  DATE OF BIRTH:  1931/05/01  Patient is status post hip surgery postop day 1. Unable to give any history secondary to significant dementia. Family in the room. No further issues per RN. REVIEW OF S YSTEMS:   Review of Systems  Unable to perform ROS: Dementia   Tolerating Diet:yes Tolerating PT: pending  DRUG ALLERGIES:   Allergies  Allergen Reactions  . Codeine Itching  . Fluzone [Flu Virus Vaccine]     Unknown reaction. Received info from nursing home  . Morphine And Related Itching  . Sulfa Antibiotics Itching    VITALS:  Blood pressure 136/66, pulse 80, temperature 98.2 F (36.8 C), temperature source Axillary, resp. rate 18, height 5\' 5"  (1.651 m), weight 71.7 kg (158 lb), SpO2 96 %.  PHYSICAL EXAMINATION:   Physical Exam  GENERAL:  83 y.o.-year-old patient lying in the bed with no acute distress.  EYES: Pupils equal, round, reactive to light and accommodation. No scleral icterus. Extraocular muscles intact.  HEENT: Head atraumatic, normocephalic. Oropharynx and nasopharynx clear.  NECK:  Supple, no jugular venous distention. No thyroid enlargement, no tenderness.  LUNGS: Normal breath sounds bilaterally, no wheezing, rales, rhonchi. No use of accessory muscles of respiration.  CARDIOVASCULAR: S1, S2 normal. No murmurs, rubs, or gallops.  ABDOMEN: Soft, nontender, nondistended. Bowel sounds present. No organomegaly or mass.  EXTREMITIES: No cyanosis, clubbing or edema b/l.   Surgical dressing+ NEUROLOGIC: Cranial nerves II through XII are intact. No focal Motor or sensory deficits b/l.   PSYCHIATRIC:  patient is alert But confused due to dementia  SKIN: No obvious rash, lesion, or ulcer.   LABORATORY PANEL:  CBC  Recent Labs Lab 08/02/16 0304  WBC 7.8  HGB 8.5*  HCT 25.8*  PLT 156    Chemistries   Recent Labs Lab 07/31/16 1148  08/02/16 0304   NA 137  < > 142  K 4.0  < > 3.4*  CL 104  < > 110  CO2 27  < > 25  GLUCOSE 175*  < > 158*  BUN 17  < > 17  CREATININE 0.70  < > 0.72  CALCIUM 9.1  < > 8.2*  AST 23  --   --   ALT 13*  --   --   ALKPHOS 72  --   --   BILITOT 0.8  --   --   < > = values in this interval not displayed. Cardiac Enzymes No results for input(s): TROPONINI in the last 168 hours. RADIOLOGY:  Dg Chest 1 View  Result Date: 07/31/2016 CLINICAL DATA:  Status post fall. History of hypertension, diabetes, uterine malignancy, nonsmoker. EXAM: CHEST 1 VIEW COMPARISON:  Portable chest x-ray of Jul 21, 2016 FINDINGS: The lungs are adequately inflated. There is no focal infiltrate. There are nodules in the lower lung zones bilaterally not appreciably changed since the previous study. The heart and pulmonary vascularity are normal. There is calcification in the wall of the aortic arch. There is mild multilevel degenerative disc disease of the thoracic spine. There degenerative changes of both shoulders. IMPRESSION: No evidence of CHF nor pneumonia. Stable nodules in the lower lungs bilaterally. A PA and lateral chest x-ray is recommended if the patient can undergo the procedure. Thoracic aortic atherosclerosis. Electronically Signed   By: David  Martinique M.D.   On: 07/31/2016 13:12   Ct Knee Left Wo Contrast  Result Date: 07/31/2016 CLINICAL DATA:  Left knee pain after fall on Saturday of last week. Unable to weightbear. EXAM: CT OF THE LEFT KNEE WITHOUT CONTRAST TECHNIQUE: Multidetector CT imaging of the left knee was performed according to the standard protocol. Multiplanar CT image reconstructions were also generated. COMPARISON:  07/31/2016 radiographs of the left femur FINDINGS: Bones/Joint/Cartilage Acute, closed, comminuted supracondylar coronal oblique fracture of the distal left femoral diaphysis and metaphysis with sagittal intercondylar component is noted. Slight dorsal displacement of the distal fracture fragment and  condyles relative to the distal femoral diaphysis and metaphysis up to 1 cm at its proximal fracture margin. No fracture of the patella nor tibial plateau. Osteoarthritic subchondral sclerosis is noted along the weight-bearing portion of the medial femoral condyles. Ligaments Suboptimally assessed by CT. Muscles and Tendons Intact extensor mechanism tendons. No intramuscular hemorrhage or atrophy. Soft tissues There is a small to moderate joint effusion with soft tissue induration along the lateral aspect of the knee. IMPRESSION: 1. Acute, closed, comminuted supracondylar coronal oblique fracture of the distal left femoral diaphysis and metaphysis with sagittal intercondylar component extending into the knee joint is noted. Slight dorsal displacement of the distal fracture fragment and condyles relative to the distal femoral diaphysis and metaphysis up to 1 cm at its proximal fracture margin. 2. No fracture of the tibia nor fibula. 3. Small joint effusion with periarticular soft tissue swelling more so along the lateral aspect. Electronically Signed   By: Ashley Royalty M.D.   On: 07/31/2016 17:48   Dg C-arm 61-120 Min  Result Date: 08/01/2016 CLINICAL DATA:  Surgery for distal femur fracture. EXAM: DG C-ARM 61-120 MIN; LEFT FEMUR 2 VIEWS COMPARISON:  None. FINDINGS: Portable images via C-arm radiography obtained in the operating room show open reduction and internal fixation of distal femur fracture. There is been placement of a screw and plate fixation device with reduction of the fracture fragments. IMPRESSION: 1. Status post ORIF of distal femur fracture. Electronically Signed   By: Kerby Moors M.D.   On: 08/01/2016 14:22   Dg Femur Min 2 Views Left  Result Date: 08/01/2016 CLINICAL DATA:  Initial evaluation status post ORIF of left femoral fracture. EXAM: LEFT FEMUR 2 VIEWS COMPARISON:  Prior intraoperative views from earlier the same day. FINDINGS: Postoperative changes from recent ORIF of distal left  femoral fracture seen. Lateral plate screw fixation has been placed at the left femur. No complication. Comminuted distal left femoral fracture in gross anatomic alignment. Left hip arthroplasty in place. Surgical drain in place with the lateral left thigh. Overlying skin staples noted. IMPRESSION: Sequelae of ORIF for distal left femoral fracture. Electronically Signed   By: Jeannine Boga M.D.   On: 08/01/2016 15:49   Dg Femur Min 2 Views Left  Result Date: 08/01/2016 CLINICAL DATA:  Surgery for distal femur fracture. EXAM: DG C-ARM 61-120 MIN; LEFT FEMUR 2 VIEWS COMPARISON:  None. FINDINGS: Portable images via C-arm radiography obtained in the operating room show open reduction and internal fixation of distal femur fracture. There is been placement of a screw and plate fixation device with reduction of the fracture fragments. IMPRESSION: 1. Status post ORIF of distal femur fracture. Electronically Signed   By: Kerby Moors M.D.   On: 08/01/2016 14:22   Dg Femur Min 2 Views Left  Result Date: 07/31/2016 CLINICAL DATA:  Fall a couple of days ago, patient has dementia, unable to communicate, held by techs for images EXAM: LEFT FEMUR 2 VIEWS COMPARISON:  None. FINDINGS:  Prior left hip replacement. There is a comminuted mildly displaced fracture noted through the distal metaphysis and likely entering the knee joint in the region of the femoral notch. No subluxation or dislocation. IMPRESSION: Comminuted, mildly displaced distal left femoral fracture. Electronically Signed   By: Rolm Baptise M.D.   On: 07/31/2016 13:18   Dg Femur, Min 2 Views Right  Result Date: 07/31/2016 CLINICAL DATA:  Fall, pain EXAM: RIGHT FEMUR 2 VIEWS COMPARISON:  None. FINDINGS: Advanced degenerative changes in the right knee. Mild degenerative changes in the right hip. No acute bony abnormality. Specifically, no fracture, subluxation, or dislocation. Soft tissues are intact. No joint effusion. IMPRESSION: No acute bony  abnormality. Electronically Signed   By: Rolm Baptise M.D.   On: 07/31/2016 13:18   ASSESSMENT AND PLAN:   Melinda Hughes is a 83 y.o. female with profound dementia presented from her nursing facility after she was noted not to be weightbearing on the left lower extremity. Despite her dementia she is ambulatory at baseline  * Left femur fracture POD#1   S/p ORIF, manage per ortho.   Rehab. Pt to be started  * Hypertension   Under control now. Cont home meds  * DM   Stable on ISS.  * dementia   Cont seroquel  Case discussed with Care Management/Social Worker. Management plans discussed with the patient, family and they are in agreement.  CODE STATUS: FULL  DVT Prophylaxis: lovnenox  TOTAL TIME TAKING CARE OF THIS PATIENT: *30* minutes.  >50% time spent on counselling and coordination of care  POSSIBLE D/C IN 1-2 DAYS, DEPENDING ON CLINICAL CONDITION.  Note: This dictation was prepared with Dragon dictation along with smaller phrase technology. Any transcriptional errors that result from this process are unintentional.  Kendal Ghazarian M.D on 08/02/2016 at 11:49 AM  Between 7am to 6pm - Pager - [phone]  After 6pm go to [email or web address] - password [email or web address] [place] [facility]  Office  [phone]  CC: Primary care physician; Kirk Ruths, MD

## 2016-08-02 NOTE — Progress Notes (Signed)
Family at bedside. Has spoken with staff at [facility], and they have selected this facility for rehab when pt is discharged. Per daughter, they were told Room 111 as they have requested a private room. Will notify SW tomorrow as it is now after hours. Pt resting comfortably, daughter plans to stay the night.

## 2016-08-02 NOTE — Evaluation (Signed)
Clinical/Bedside Swallow Evaluation Patient Details  Name: Melinda Hughes MRN: [id number] Date of Birth: 1931/09/10  Today's Date: 08/02/2016 Time: SLP Start Time (ACUTE ONLY): 37 SLP Stop Time (ACUTE ONLY): 1020 SLP Time Calculation (min) (ACUTE ONLY): 60 min  Past Medical History:  Past Medical History:  Diagnosis Date  . Arthritis   . Dementia   . Diabetes mellitus without complication ([place])   . Hypertension   . Uterine cancer [facility])    Past Surgical History:  Past Surgical History:  Procedure Laterality Date  . ABDOMINAL HYSTERECTOMY    . EYE SURGERY    . FRACTURE SURGERY     HPI:  Pt is a 83 y.o. female with a known history of Severe dementia, arthritis, HTN, and DM who comes from [facility] because of the fall, patient had a fall on Saturday of the last week. Family noticed that she is not able to bear weight on the left leg. Presents with external rotation and shortening of the left leg. Patient was given fentanyl en route by EMS. Patient has severe dementia so unable to give any history and review of systems. Surgery completed yesterday for left distal femur fracture. Pt is currently drowsy/lethargic and requires moderate stimulation for response for follow through w/ po trials, oral intake. Husband aware pt is too drowsy/sleepy for meal currently. Suspect pt's baseline Cognitive decline may be impacting her alertness.    Assessment / Plan / Recommendation Clinical Impression  Pt appears at increased risk for aspiration secondary to declined Cognitive status (Dementia baseline) and currenty decreased alertness which impacted the oral phase thus oropharyngeal phase dysphagia; pt is also edentulous on top which can impact bolus mastication and control for swallowing. Pt consumed trials of ice chips w/ reduced oral phase awareness and bolus management which can increase risk for pharyngeal phase dysphagia; no coughing or other overt s/s of aspiration were noted w/ the trials,  however, no other trials were given at the time d/t sleepiness and need for stimluation. Suspect pt's baseline Dementia could be impacting her alertness at this time of acute illness - this was explained to pt's Husband present as well as the need for strict aspiration precautions w/ any po's. Recommend a pureed diet consistency as it will allow for better oral management and control in light of missing upper dentition for mastication. This diet consistency would best serve pt during this time of acute illness and suspected increased risk for oropharyngeal phase dysphagia. Recommend Pills given in puree - crushed as needed for safer swallowing; feeding support and monitoring at all meals for alertness. NSG updated, agreed. Dietician f/u for support at this time. SLP Visit Diagnosis: Dysphagia, oropharyngeal phase (R13.12) (at this time)    Aspiration Risk  Mild aspiration risk    Diet Recommendation  Dysphagia level 1 (puree) w/ thin liquids; strict aspiration precautions; feeding support and monitoring at all meals.   Medication Administration: Crushed with puree (whole if able to appropriately manage/swallow)    Other  Recommendations Recommended Consults:  (Dietician f/u) Oral Care Recommendations: Oral care BID;Staff/trained caregiver to provide oral care   Follow up Recommendations Skilled Nursing facility      Frequency and Duration min 3x week  2 weeks       Prognosis Prognosis for Safe Diet Advancement: Fair Barriers to Reach Goals: Cognitive deficits      Swallow Study   General Date of Onset: 07/31/16 HPI: Pt is a 83 y.o. female with a known history of Severe dementia, arthritis,  HTN, and DM who comes from [facility] because of the fall, patient had a fall on Saturday of the last week. Family noticed that she is not able to bear weight on the left leg. Presents with external rotation and shortening of the left leg. Patient was given fentanyl en route by EMS. Patient has  severe dementia so unable to give any history and review of systems. Surgery completed yesterday for left distal femur fracture. Pt is currently drowsy/lethargic and requires moderate stimulation for response for follow through w/ po trials, oral intake. Husband aware pt is too drowsy/sleepy for meal currently. Suspect pt's baseline Cognitive decline may be impacting her alertness.  Type of Study: Bedside Swallow Evaluation Previous Swallow Assessment: none indicated per husband Diet Prior to this Study: Dysphagia 3 (soft);Regular;Thin liquids Temperature Spikes Noted: No (wbc 7.8) Respiratory Status: Room air History of Recent Intubation: No Behavior/Cognition: Lethargic/Drowsy;Distractible;Requires cueing (baseline Dementia) Oral Cavity Assessment: Excessive secretions (min) Oral Care Completed by SLP: Recent completion by staff Oral Cavity - Dentition: Dentures, bottom (no upper dentition, dentures) Vision:  (n/a) Self-Feeding Abilities: Total assist (at this time) Patient Positioning: Upright in bed Baseline Vocal Quality:  (mumbled responses x3) Volitional Cough: Cognitively unable to elicit Volitional Swallow: Unable to elicit    Oral/Motor/Sensory Function Overall Oral Motor/Sensory Function:  (appeared fair w/ bolus management; did not follow)   Ice Chips Ice chips: Impaired Presentation: Spoon (fed; 2 trials) Oral Phase Impairments: Reduced labial seal;Impaired mastication;Poor awareness of bolus Oral Phase Functional Implications: Prolonged oral transit Pharyngeal Phase Impairments: Suspected delayed Swallow Other Comments: too drowsy   Thin Liquid Thin Liquid: Not tested    Nectar Thick Nectar Thick Liquid: Not tested   Honey Thick Honey Thick Liquid: Not tested   Puree Puree: Not tested   Solid   GO   Solid: Not tested         Melinda Kenner, MS, CCC-SLP Melinda Hughes 08/02/2016,11:23 AM

## 2016-08-02 NOTE — Clinical Social Work Note (Addendum)
CSW attempted to contact the patient's daughter Melinda Hughes to discuss bed offers. The CSW left a voicemail and is waiting for a call back. As the patient's family had already noted that [facility] is the preference, the CSW has selected that bed offer to secure the discharge facility.  CSW received a call back from Melinda Hughes. Melinda Hughes would like to discuss bed offers with her father. Melinda Hughes will call back after 1pm today to make a firm decision. CSW de-selected [facility] until firm decision is delivered.   The family called back at 1:45 pm to discuss bed offers. The daughter became tearful and advised that she feels that none of the facilities are able to meet her mother's needs. The CSW assured the family that facilities will not make bed offers if unable to meet medical and behavioral needs of the patients. The family asked the CSW for a suggestion, and the CSW advised that personal suggestions are not able to be made per Medicare policy. The CSW informed the family of each facility's Medicare rating. The family will deliver their decision before Monday. CSW will continue to follow for discharge facilitation.   Melinda Hughes, MSW, Latanya Presser [phone]

## 2016-08-02 NOTE — Plan of Care (Signed)
Problem: Education: Goal: Knowledge of [facility] [facility] Education information/materials will improve Outcome: Not Progressing No evidence of learning

## 2016-08-02 NOTE — Progress Notes (Signed)
Speech Therapy Note: order received yesterday for BSE was discontinued today. This SLP consulted NSG and offered f/u if NSG noted any s/s of dysphagia; also recommended general aspiration precautions w/ oral intake. NSG agreed and will reconsult ST services if needed.    Orinda Kenner, [place], CCC-SLP

## 2016-08-02 NOTE — Progress Notes (Signed)
Subjective:  POD#1 s/p ORIF left distal femur fracture.  He shouldn't has dementia and is unable to provide a history. Her husband is at the bedside. Patient is sleeping comfortably. Patient is seen this morning with her RN Magda Paganini.  Objective:   VITALS:   Vitals:   08/01/16 1909 08/01/16 2000 08/01/16 2100 08/02/16 0000  BP: (!) 162/72 (!) 165/71 112/62 136/66  Pulse: 90 76 86 80  Resp: 19 18  18   Temp: 98.4 F (36.9 C)   98.2 F (36.8 C)  TempSrc: Axillary   Axillary  SpO2:  96%  96%  Weight:      Height:        PHYSICAL EXAM:  Left lower extremity: Patient's Hemovac drain was removed. Patient unable to follow commands. Motor and sensory function cannot be assessed. Intact pulses distally Incision: scant drainage No cellulitis present Compartment soft  LABS  Results for orders placed or performed during the hospital encounter of 07/31/16 (from the past 24 hour(s))  Glucose, capillary     Status: Abnormal   Collection Time: 08/01/16 10:29 AM  Result Value Ref Range   Glucose-Capillary 110 (H) 65 - 99 mg/dL  Glucose, capillary     Status: None   Collection Time: 08/01/16  3:24 PM  Result Value Ref Range   Glucose-Capillary 84 65 - 99 mg/dL  Glucose, capillary     Status: Abnormal   Collection Time: 08/01/16  4:46 PM  Result Value Ref Range   Glucose-Capillary 109 (H) 65 - 99 mg/dL  Glucose, capillary     Status: Abnormal   Collection Time: 08/01/16  9:32 PM  Result Value Ref Range   Glucose-Capillary 145 (H) 65 - 99 mg/dL   Comment 1 Notify RN   CBC     Status: Abnormal   Collection Time: 08/02/16  3:04 AM  Result Value Ref Range   WBC 7.8 3.6 - 11.0 K/uL   RBC 3.04 (L) 3.80 - 5.20 MIL/uL   Hemoglobin 8.5 (L) 12.0 - 16.0 g/dL   HCT 25.8 (L) 35.0 - 47.0 %   MCV 85.1 80.0 - 100.0 fL   MCH 28.1 26.0 - 34.0 pg   MCHC 33.0 32.0 - 36.0 g/dL   RDW 14.8 (H) 11.5 - 14.5 %   Platelets 156 150 - 440 K/uL  Basic metabolic panel     Status: Abnormal   Collection  Time: 08/02/16  3:04 AM  Result Value Ref Range   Sodium 142 135 - 145 mmol/L   Potassium 3.4 (L) 3.5 - 5.1 mmol/L   Chloride 110 101 - 111 mmol/L   CO2 25 22 - 32 mmol/L   Glucose, Bld 158 (H) 65 - 99 mg/dL   BUN 17 6 - 20 mg/dL   Creatinine, Ser 0.72 0.44 - 1.00 mg/dL   Calcium 8.2 (L) 8.9 - 10.3 mg/dL   GFR calc non Af Amer >60 >60 mL/min   GFR calc Af Amer >60 >60 mL/min   Anion gap 7 5 - 15  Glucose, capillary     Status: Abnormal   Collection Time: 08/02/16  7:29 AM  Result Value Ref Range   Glucose-Capillary 148 (H) 65 - 99 mg/dL   Comment 1 Notify RN    Comment 2 Document in Chart     Dg Chest 1 View  Result Date: 07/31/2016 CLINICAL DATA:  Status post fall. History of hypertension, diabetes, uterine malignancy, nonsmoker. EXAM: CHEST 1 VIEW COMPARISON:  Portable chest x-ray of Jul 21, 2016  FINDINGS: The lungs are adequately inflated. There is no focal infiltrate. There are nodules in the lower lung zones bilaterally not appreciably changed since the previous study. The heart and pulmonary vascularity are normal. There is calcification in the wall of the aortic arch. There is mild multilevel degenerative disc disease of the thoracic spine. There degenerative changes of both shoulders. IMPRESSION: No evidence of CHF nor pneumonia. Stable nodules in the lower lungs bilaterally. A PA and lateral chest x-ray is recommended if the patient can undergo the procedure. Thoracic aortic atherosclerosis. Electronically Signed   By: David  Martinique M.D.   On: 07/31/2016 13:12   Ct Knee Left Wo Contrast  Result Date: 07/31/2016 CLINICAL DATA:  Left knee pain after fall on Saturday of last week. Unable to weightbear. EXAM: CT OF THE LEFT KNEE WITHOUT CONTRAST TECHNIQUE: Multidetector CT imaging of the left knee was performed according to the standard protocol. Multiplanar CT image reconstructions were also generated. COMPARISON:  07/31/2016 radiographs of the left femur FINDINGS:  Bones/Joint/Cartilage Acute, closed, comminuted supracondylar coronal oblique fracture of the distal left femoral diaphysis and metaphysis with sagittal intercondylar component is noted. Slight dorsal displacement of the distal fracture fragment and condyles relative to the distal femoral diaphysis and metaphysis up to 1 cm at its proximal fracture margin. No fracture of the patella nor tibial plateau. Osteoarthritic subchondral sclerosis is noted along the weight-bearing portion of the medial femoral condyles. Ligaments Suboptimally assessed by CT. Muscles and Tendons Intact extensor mechanism tendons. No intramuscular hemorrhage or atrophy. Soft tissues There is a small to moderate joint effusion with soft tissue induration along the lateral aspect of the knee. IMPRESSION: 1. Acute, closed, comminuted supracondylar coronal oblique fracture of the distal left femoral diaphysis and metaphysis with sagittal intercondylar component extending into the knee joint is noted. Slight dorsal displacement of the distal fracture fragment and condyles relative to the distal femoral diaphysis and metaphysis up to 1 cm at its proximal fracture margin. 2. No fracture of the tibia nor fibula. 3. Small joint effusion with periarticular soft tissue swelling more so along the lateral aspect. Electronically Signed   By: Ashley Royalty M.D.   On: 07/31/2016 17:48   Dg C-arm 61-120 Min  Result Date: 08/01/2016 CLINICAL DATA:  Surgery for distal femur fracture. EXAM: DG C-ARM 61-120 MIN; LEFT FEMUR 2 VIEWS COMPARISON:  None. FINDINGS: Portable images via C-arm radiography obtained in the operating room show open reduction and internal fixation of distal femur fracture. There is been placement of a screw and plate fixation device with reduction of the fracture fragments. IMPRESSION: 1. Status post ORIF of distal femur fracture. Electronically Signed   By: Kerby Moors M.D.   On: 08/01/2016 14:22   Dg Femur Min 2 Views Left  Result  Date: 08/01/2016 CLINICAL DATA:  Initial evaluation status post ORIF of left femoral fracture. EXAM: LEFT FEMUR 2 VIEWS COMPARISON:  Prior intraoperative views from earlier the same day. FINDINGS: Postoperative changes from recent ORIF of distal left femoral fracture seen. Lateral plate screw fixation has been placed at the left femur. No complication. Comminuted distal left femoral fracture in gross anatomic alignment. Left hip arthroplasty in place. Surgical drain in place with the lateral left thigh. Overlying skin staples noted. IMPRESSION: Sequelae of ORIF for distal left femoral fracture. Electronically Signed   By: Jeannine Boga M.D.   On: 08/01/2016 15:49   Dg Femur Min 2 Views Left  Result Date: 08/01/2016 CLINICAL DATA:  Surgery for distal femur  fracture. EXAM: DG C-ARM 61-120 MIN; LEFT FEMUR 2 VIEWS COMPARISON:  None. FINDINGS: Portable images via C-arm radiography obtained in the operating room show open reduction and internal fixation of distal femur fracture. There is been placement of a screw and plate fixation device with reduction of the fracture fragments. IMPRESSION: 1. Status post ORIF of distal femur fracture. Electronically Signed   By: Kerby Moors M.D.   On: 08/01/2016 14:22   Dg Femur Min 2 Views Left  Result Date: 07/31/2016 CLINICAL DATA:  Fall a couple of days ago, patient has dementia, unable to communicate, held by techs for images EXAM: LEFT FEMUR 2 VIEWS COMPARISON:  None. FINDINGS: Prior left hip replacement. There is a comminuted mildly displaced fracture noted through the distal metaphysis and likely entering the knee joint in the region of the femoral notch. No subluxation or dislocation. IMPRESSION: Comminuted, mildly displaced distal left femoral fracture. Electronically Signed   By: Rolm Baptise M.D.   On: 07/31/2016 13:18   Dg Femur, Min 2 Views Right  Result Date: 07/31/2016 CLINICAL DATA:  Fall, pain EXAM: RIGHT FEMUR 2 VIEWS COMPARISON:  None. FINDINGS:  Advanced degenerative changes in the right knee. Mild degenerative changes in the right hip. No acute bony abnormality. Specifically, no fracture, subluxation, or dislocation. Soft tissues are intact. No joint effusion. IMPRESSION: No acute bony abnormality. Electronically Signed   By: Rolm Baptise M.D.   On: 07/31/2016 13:18    Assessment/Plan: 1 Day Post-Op   Active Problems:   Closed left subtrochanteric femur fracture, initial encounter [facility])  Patient is doing well postop.  Patient has stable vital signs. Her hemoglobin and hematocrit are within acceptable limits. Patient's Hemovac drain was removed by me today. Patient will have physical therapy assessment. She may only be able to the slide board from bed to chair. Given her dementia she will not be able to follow as well therapy commands. Patient is nonweightbearing on the left lower extremity. Patient will complete 24 hours postop antibiotics. She'll begin Lovenox today for DVT prophylaxis. Her Foley catheter will be removed.  Labs will be redrawn tomorrow.    Thornton Park , MD 08/02/2016, 9:32 AM

## 2016-08-03 LAB — URINALYSIS, COMPLETE (UACMP) WITH MICROSCOPIC
BILIRUBIN URINE: NEGATIVE
Bacteria, UA: NONE SEEN
Glucose, UA: NEGATIVE mg/dL
KETONES UR: 20 mg/dL — AB
Leukocytes, UA: NEGATIVE
Nitrite: NEGATIVE
Protein, ur: NEGATIVE mg/dL
SPECIFIC GRAVITY, URINE: 1.018 (ref 1.005–1.030)
pH: 5 (ref 5.0–8.0)

## 2016-08-03 LAB — GLUCOSE, CAPILLARY
GLUCOSE-CAPILLARY: 118 mg/dL — AB (ref 65–99)
GLUCOSE-CAPILLARY: 113 mg/dL — AB (ref 65–99)

## 2016-08-03 LAB — BASIC METABOLIC PANEL
Anion gap: 5 (ref 5–15)
BUN: 17 mg/dL (ref 6–20)
CALCIUM: 8.3 mg/dL — AB (ref 8.9–10.3)
CO2: 27 mmol/L (ref 22–32)
Chloride: 112 mmol/L — ABNORMAL HIGH (ref 101–111)
Creatinine, Ser: 0.58 mg/dL (ref 0.44–1.00)
GFR calc Af Amer: >60 mL/min (ref >60)
GLUCOSE: 140 mg/dL — AB (ref 65–99)
Potassium: 3.1 mmol/L — ABNORMAL LOW (ref 3.5–5.1)
SODIUM: 144 mmol/L (ref 135–145)

## 2016-08-03 LAB — CBC
HCT: 22.6 % — ABNORMAL LOW (ref 35.0–47.0)
Hemoglobin: 7.5 g/dL — ABNORMAL LOW (ref 12.0–16.0)
MCH: 28.3 pg (ref 26.0–34.0)
MCHC: 33.2 g/dL (ref 32.0–36.0)
MCV: 85.2 fL (ref 80.0–100.0)
PLATELETS: 165 10*3/uL (ref 150–440)
RBC: 2.65 MIL/uL — ABNORMAL LOW (ref 3.80–5.20)
RDW: 15.2 % — AB (ref 11.5–14.5)
WBC: 7.1 10*3/uL (ref 3.6–11.0)

## 2016-08-03 LAB — PREPARE RBC (CROSSMATCH)

## 2016-08-03 LAB — ABO/RH: ABO/RH(D): O POS

## 2016-08-03 MED ORDER — SODIUM CHLORIDE 0.9 % IV SOLN: Freq: Once | INTRAVENOUS | Status: DC

## 2016-08-03 MED ORDER — TRAMADOL HCL 50 MG PO TABS
25.0000 mg | ORAL_TABLET | Freq: Four times a day (QID) | ORAL | Status: DC | PRN
  Filled 2016-08-03: qty 1

## 2016-08-03 MED ORDER — ACETAMINOPHEN 325 MG PO TABS: 650.0000 mg | ORAL_TABLET | Freq: Once | ORAL | Status: DC

## 2016-08-03 MED ORDER — TRAMADOL 5 MG/ML ORAL SUSPENSION: 25.0000 mg | Freq: Four times a day (QID) | ORAL | Status: DC | PRN

## 2016-08-03 NOTE — Clinical Social Work Note (Signed)
The patient's daughter called the CSW to confirm the original decision to choose [facility]. The CSW thanked the patient's daughter and selected her choice in the [place]. [facility] can receive the patient when she is medically stable.

## 2016-08-03 NOTE — Anesthesia Postprocedure Evaluation (Signed)
Anesthesia Post Note  Patient: Melinda Hughes  Procedure(s) Performed: Procedure(s) (LRB): OPEN REDUCTION INTERNAL FIXATION (ORIF) DISTAL FEMUR FRACTURE (Left)  Patient location during evaluation: PACU Anesthesia Type: Spinal Level of consciousness: oriented and awake and alert Pain management: pain level controlled Vital Signs Assessment: post-procedure vital signs reviewed and stable Respiratory status: spontaneous breathing, respiratory function stable and nonlabored ventilation Cardiovascular status: blood pressure returned to baseline and stable Postop Assessment: no headache, no backache and spinal receding Anesthetic complications: no     Last Vitals:  Vitals:   08/03/16 1302 08/03/16 1642  BP: (!) 164/72 138/73  Pulse:  87  Resp:  20  Temp:  36.9 C    Last Pain:  Vitals:   08/03/16 1642  TempSrc: Oral  PainSc:                  Manny Vitolo

## 2016-08-03 NOTE — Progress Notes (Addendum)
Subjective:  POD #2  status post ORIF left distal femur fracture. Patient has advanced dementia and is unable to provide a history. Patient has not required narcotic pain medication according to the RN. Patient's family is at the bedside including her husband and daughter.  Objective:   VITALS:   Vitals:   08/02/16 2059 08/03/16 0115 08/03/16 0804 08/03/16 1302  BP:  (!) 141/83 (!) 163/70 (!) 164/72  Pulse:  91 80   Resp:   20   Temp: 100.3 F (37.9 C) 100.1 F (37.8 C) 99 F (37.2 C)   TempSrc: Oral Axillary Oral   SpO2:  96% 94%   Weight:      Height:        PHYSICAL EXAM:  She is awake today and appears in no acute distress.  Left lower extremity: I personally changed the patient's dressing today with Magda Paganini her nurse. Patient had a moderate amount of dried blood on the dressing from immediate postop. There was no active drainage. There is no fluctuance or redness to suggest infection. Since thigh compartments are soft and compressible. Neurovascular intact Sensation intact distally Intact pulses distally Dorsiflexion/Plantar flexion intact Incision: moderate drainage No cellulitis present Compartment soft  LABS  Results for orders placed or performed during the hospital encounter of 07/31/16 (from the past 24 hour(s))  Glucose, capillary     Status: Abnormal   Collection Time: 08/02/16  4:27 PM  Result Value Ref Range   Glucose-Capillary 115 (H) 65 - 99 mg/dL   Comment 1 Notify RN    Comment 2 Document in Chart   Glucose, capillary     Status: Abnormal   Collection Time: 08/02/16  9:30 PM  Result Value Ref Range   Glucose-Capillary 156 (H) 65 - 99 mg/dL   Comment 1 Notify RN   CBC     Status: Abnormal   Collection Time: 08/03/16  4:25 AM  Result Value Ref Range   WBC 7.1 3.6 - 11.0 K/uL   RBC 2.65 (L) 3.80 - 5.20 MIL/uL   Hemoglobin 7.5 (L) 12.0 - 16.0 g/dL   HCT 22.6 (L) 35.0 - 47.0 %   MCV 85.2 80.0 - 100.0 fL   MCH 28.3 26.0 - 34.0 pg   MCHC 33.2  32.0 - 36.0 g/dL   RDW 15.2 (H) 11.5 - 14.5 %   Platelets 165 150 - 440 K/uL  Basic metabolic panel     Status: Abnormal   Collection Time: 08/03/16  4:25 AM  Result Value Ref Range   Sodium 144 135 - 145 mmol/L   Potassium 3.1 (L) 3.5 - 5.1 mmol/L   Chloride 112 (H) 101 - 111 mmol/L   CO2 27 22 - 32 mmol/L   Glucose, Bld 140 (H) 65 - 99 mg/dL   BUN 17 6 - 20 mg/dL   Creatinine, Ser 0.58 0.44 - 1.00 mg/dL   Calcium 8.3 (L) 8.9 - 10.3 mg/dL   GFR calc non Af Amer >60 >60 mL/min   GFR calc Af Amer >60 >60 mL/min   Anion gap 5 5 - 15  Glucose, capillary     Status: Abnormal   Collection Time: 08/03/16  7:21 AM  Result Value Ref Range   Glucose-Capillary 118 (H) 65 - 99 mg/dL   Comment 1 Notify RN    Comment 2 Document in Chart   Glucose, capillary     Status: Abnormal   Collection Time: 08/03/16 11:09 AM  Result Value Ref Range   Glucose-Capillary  113 (H) 65 - 99 mg/dL   Comment 1 Notify RN    Comment 2 Document in Chart   Urinalysis, Complete w Microscopic     Status: Abnormal   Collection Time: 08/03/16 12:40 PM  Result Value Ref Range   Color, Urine YELLOW (A) YELLOW   APPearance CLEAR (A) CLEAR   Specific Gravity, Urine 1.018 1.005 - 1.030   pH 5.0 5.0 - 8.0   Glucose, UA NEGATIVE NEGATIVE mg/dL   Hgb urine dipstick SMALL (A) NEGATIVE   Bilirubin Urine NEGATIVE NEGATIVE   Ketones, ur 20 (A) NEGATIVE mg/dL   Protein, ur NEGATIVE NEGATIVE mg/dL   Nitrite NEGATIVE NEGATIVE   Leukocytes, UA NEGATIVE NEGATIVE   RBC / HPF 0-5 0 - 5 RBC/hpf   WBC, UA 0-5 0 - 5 WBC/hpf   Bacteria, UA NONE SEEN NONE SEEN   Squamous Epithelial / LPF 0-5 (A) NONE SEEN   Mucous PRESENT    Hyaline Casts, UA PRESENT   Prepare RBC     Status: None (Preliminary result)   Collection Time: 08/03/16  3:30 PM  Result Value Ref Range   Order Confirmation PENDING     Dg Femur Min 2 Views Left  Result Date: 08/01/2016 CLINICAL DATA:  Initial evaluation status post ORIF of left femoral fracture.  EXAM: LEFT FEMUR 2 VIEWS COMPARISON:  Prior intraoperative views from earlier the same day. FINDINGS: Postoperative changes from recent ORIF of distal left femoral fracture seen. Lateral plate screw fixation has been placed at the left femur. No complication. Comminuted distal left femoral fracture in gross anatomic alignment. Left hip arthroplasty in place. Surgical drain in place with the lateral left thigh. Overlying skin staples noted. IMPRESSION: Sequelae of ORIF for distal left femoral fracture. Electronically Signed   By: Jeannine Boga M.D.   On: 08/01/2016 15:49    Assessment/Plan: 2 Days Post-Op   Active Problems:   Closed left subtrochanteric femur fracture, initial encounter ([place])  I'm ordering 1 unit of packed red blood cells to be transfused today due to her hemoglobin being 7.5.  Patient is anemic to to the extensive fracture she sustained to her left femur. She likely has lost a significant amount of blood from the fracture. Patient will remain non-weightbearing on the left lower extremity for a total of 6-8 weeks postop.  Continue judicious use of pain medications only for severe pain.  Continue physical therapy. Possible discharge to rehabilitation tomorrow pending medical clearance.  Patient's family understands and agrees with this plan.  I gave the patient's family copy of her postop AP x-ray so they can better understand what was done surgically. I explained the x-ray images to them. I answered all their questions.    Thornton Park , MD 08/03/2016, 3:40 PM

## 2016-08-03 NOTE — Progress Notes (Signed)
[place] [facility] [place] NAME: Melinda Hughes    MR#:  [id number]  DATE OF BIRTH:  [birth year]  Patient is status post hip surgery postop day 2 Unable to give any history secondary to significant dementia. Family in the room. No further issues per RN. Low grade temp. Not eating much REVIEW OF S YSTEMS:   Review of Systems  Unable to perform ROS: Dementia   Tolerating Diet:yes Tolerating PT: pending  DRUG ALLERGIES:   Allergies  Allergen Reactions  . Codeine Itching  . Fluzone [Flu Virus Vaccine]     Unknown reaction. Received info from nursing home  . Morphine And Related Itching  . Sulfa Antibiotics Itching    VITALS:  Blood pressure (!) 164/72, pulse 80, temperature 99 F (37.2 C), temperature source Oral, resp. rate 20, height 5\' 5"  (1.651 m), weight 71.7 kg (158 lb), SpO2 94 %.  PHYSICAL EXAMINATION:   Physical Exam  GENERAL:  83 y.o.-year-old patient lying in the bed with no acute distress.  EYES: Pupils equal, round, reactive to light and accommodation. No scleral icterus. Extraocular muscles intact.  HEENT: Head atraumatic, normocephalic. Oropharynx and nasopharynx clear.  NECK:  Supple, no jugular venous distention. No thyroid enlargement, no tenderness.  LUNGS: Normal breath sounds bilaterally, no wheezing, rales, rhonchi. No use of accessory muscles of respiration.  CARDIOVASCULAR: S1, S2 normal. No murmurs, rubs, or gallops.  ABDOMEN: Soft, nontender, nondistended. Bowel sounds present. No organomegaly or mass.  EXTREMITIES: No cyanosis, clubbing or edema b/l.   Surgical dressing+ NEUROLOGIC: Cranial nerves II through XII are intact. No focal Motor or sensory deficits b/l.   PSYCHIATRIC:  patient is alert But confused due to dementia  SKIN: No obvious rash, lesion, or ulcer.   LABORATORY PANEL:  CBC  Recent Labs Lab 08/03/16 0425  WBC 7.1  HGB 7.5*  HCT 22.6*  PLT 165    Chemistries   Recent Labs Lab  07/31/16 1148  08/03/16 0425  NA 137  < > 144  K 4.0  < > 3.1*  CL 104  < > 112*  CO2 27  < > 27  GLUCOSE 175*  < > 140*  BUN 17  < > 17  CREATININE 0.70  < > 0.58  CALCIUM 9.1  < > 8.3*  AST 23  --   --   ALT 13*  --   --   ALKPHOS 72  --   --   BILITOT 0.8  --   --   < > = values in this interval not displayed. Cardiac Enzymes No results for input(s): TROPONINI in the last 168 hours. RADIOLOGY:  Dg C-arm 61-120 Min  Result Date: 08/01/2016 CLINICAL DATA:  Surgery for distal femur fracture. EXAM: DG C-ARM 61-120 MIN; LEFT FEMUR 2 VIEWS COMPARISON:  None. FINDINGS: Portable images via C-arm radiography obtained in the operating room show open reduction and internal fixation of distal femur fracture. There is been placement of a screw and plate fixation device with reduction of the fracture fragments. IMPRESSION: 1. Status post ORIF of distal femur fracture. Electronically Signed   By: Kerby Moors M.D.   On: 08/01/2016 14:22   Dg Femur Min 2 Views Left  Result Date: 08/01/2016 CLINICAL DATA:  Initial evaluation status post ORIF of left femoral fracture. EXAM: LEFT FEMUR 2 VIEWS COMPARISON:  Prior intraoperative views from earlier the same day. FINDINGS: Postoperative changes from recent ORIF of distal left femoral fracture seen. Lateral  plate screw fixation has been placed at the left femur. No complication. Comminuted distal left femoral fracture in gross anatomic alignment. Left hip arthroplasty in place. Surgical drain in place with the lateral left thigh. Overlying skin staples noted. IMPRESSION: Sequelae of ORIF for distal left femoral fracture. Electronically Signed   By: Jeannine Boga M.D.   On: 08/01/2016 15:49   Dg Femur Min 2 Views Left  Result Date: 08/01/2016 CLINICAL DATA:  Surgery for distal femur fracture. EXAM: DG C-ARM 61-120 MIN; LEFT FEMUR 2 VIEWS COMPARISON:  None. FINDINGS: Portable images via C-arm radiography obtained in the operating room show open  reduction and internal fixation of distal femur fracture. There is been placement of a screw and plate fixation device with reduction of the fracture fragments. IMPRESSION: 1. Status post ORIF of distal femur fracture. Electronically Signed   By: Kerby Moors M.D.   On: 08/01/2016 14:22   ASSESSMENT AND PLAN:   Melinda Hughes is a 83 y.o. female with profound dementia presented from her nursing facility after she was noted not to be weightbearing on the left lower extremity. Despite her dementia she is ambulatory at baseline  * Left femur fracture POD# 2   S/p ORIF, manage per ortho.   Rehab. PT to be started -hgb 10.0---7.5   * Hypertension   Under control now. Cont home meds  * DM   Stable on ISS.  * dementia   Cont seroquel  * low grade fever Appears post of fever. UA and cxr ok   * poor po intake -encourage intermittently to eat and drink. Has significant dementia also  Case discussed with Care Management/Social Worker. Management plans discussed with the patient, family and they are in agreement.  CODE STATUS: FULL  DVT Prophylaxis: lovnenox  TOTAL TIME TAKING CARE OF THIS PATIENT: *30* minutes.  >50% time spent on counselling and coordination of care  POSSIBLE D/C IN 1-2 DAYS, DEPENDING ON CLINICAL CONDITION.  Note: This dictation was prepared with Dragon dictation along with smaller phrase technology. Any transcriptional errors that result from this process are unintentional.  Glade Strausser M.D on 08/03/2016 at 1:24 PM  Between 7am to 6pm - Pager - [phone]  After 6pm go to [email or web address] - password [email or web address] [facility]  Office  [phone]  CC: Primary care physician; Kirk Ruths, MD

## 2016-08-03 NOTE — Progress Notes (Signed)
PT Cancellation Note  Patient Details Name: Anda Sobotta MRN: [id number] DOB: 07/18/1931   Cancelled Treatment:    Reason Eval/Treat Not Completed: Patient's level of consciousness Pt has been very lethargic, PT has not been able to see her.  She showed some awareness on PT initially entering and daughter was present and seemed eager for PT to see.  Pt subsequently fell back asleep and was not at all part of the remainder of the conversation.  Husband arrived and was adamant that PT not see her today, did not agree to even light in-bed activities.  Will hold PT today, discussed with family the importance of starting even very limited activities soon as she has not done anything yet.  Kreg Shropshire, DPT 08/03/2016, 2:51 PM

## 2016-08-03 NOTE — Progress Notes (Addendum)
Blood started, no adverse effect noted. Pt much more alert this evening, talking constantly and disoriented x 4. Husband at bedside. Pt ate several bites, about 20% of meal, drinking liquids from cup, does not use straw well. Family educated on aspiration precautions, need reinforcement, husband noted giving pt liquids in semi-reclined position. Demonstration and education on position and technique provided. SLP eval not completed over weekend due to pts mental status, need to follow up on Monday.

## 2016-08-03 NOTE — Progress Notes (Signed)
I & o cath performed to obtain urine sample. Pt tolerated procedure.

## 2016-08-04 ENCOUNTER — Encounter: Payer: Self-pay | Admitting: Orthopedic Surgery

## 2016-08-04 LAB — BPAM RBC
Blood Product Expiration Date: 201806132359
ISSUE DATE / TIME: 201805201752
UNIT TYPE AND RH: 5100

## 2016-08-04 LAB — POTASSIUM: Potassium: 3.1 mmol/L — ABNORMAL LOW (ref 3.5–5.1)

## 2016-08-04 LAB — TYPE AND SCREEN
ABO/RH(D): O POS
Antibody Screen: NEGATIVE
Unit division: 0

## 2016-08-04 LAB — CBC
HCT: 27.2 % — ABNORMAL LOW (ref 35.0–47.0)
Hemoglobin: 9.1 g/dL — ABNORMAL LOW (ref 12.0–16.0)
MCH: 28.3 pg (ref 26.0–34.0)
MCHC: 33.4 g/dL (ref 32.0–36.0)
MCV: 84.8 fL (ref 80.0–100.0)
PLATELETS: 187 10*3/uL (ref 150–440)
RBC: 3.21 MIL/uL — ABNORMAL LOW (ref 3.80–5.20)
RDW: 15.2 % — AB (ref 11.5–14.5)
WBC: 6.2 10*3/uL (ref 3.6–11.0)

## 2016-08-04 LAB — GLUCOSE, CAPILLARY
Glucose-Capillary: 130 mg/dL — ABNORMAL HIGH (ref 65–99)
Glucose-Capillary: 177 mg/dL — ABNORMAL HIGH (ref 65–99)

## 2016-08-04 MED ORDER — INSULIN ASPART 100 UNIT/ML ~~LOC~~ SOLN
0.0000 [IU] | Freq: Three times a day (TID) | SUBCUTANEOUS | Status: DC
  Administered 2016-08-04: 1 [IU] via SUBCUTANEOUS
  Administered 2016-08-04: 2 [IU] via SUBCUTANEOUS
  Filled 2016-08-04: qty 1

## 2016-08-04 MED ORDER — POTASSIUM CHLORIDE 20 MEQ PO PACK
40.0000 meq | PACK | Freq: Once | ORAL | Status: AC
  Administered 2016-08-04: 40 meq via ORAL
  Filled 2016-08-04: qty 2

## 2016-08-04 MED ORDER — HYDRALAZINE HCL 25 MG PO TABS: 25.0000 mg | ORAL_TABLET | Freq: Three times a day (TID) | ORAL | 0 refills | Status: AC

## 2016-08-04 MED ORDER — ENOXAPARIN SODIUM 40 MG/0.4ML ~~LOC~~ SOLN: 40.0000 mg | SUBCUTANEOUS | 0 refills | Status: AC

## 2016-08-04 MED ORDER — FERROUS SULFATE 325 (65 FE) MG PO TABS: 325.0000 mg | ORAL_TABLET | Freq: Three times a day (TID) | ORAL | 3 refills | Status: AC

## 2016-08-04 MED ORDER — FLEET ENEMA 7-19 GM/118ML RE ENEM: 1.0000 | ENEMA | Freq: Once | RECTAL | Status: DC

## 2016-08-04 NOTE — Progress Notes (Signed)
[facility] here for pick up.   Meg Niemeier, RN  

## 2016-08-04 NOTE — Clinical Social Work Placement (Signed)
   CLINICAL SOCIAL WORK PLACEMENT  NOTE  Date:  08/04/2016  Patient Details  Name: Melinda Hughes MRN: [id number] Date of Birth: [birth year]  Clinical Social Work is seeking post-discharge placement for this patient at the [place] level of care (*CSW will initial, date and re-position this form in  chart as items are completed):  Yes   Patient/family provided with [facility] Work Department's list of facilities offering this level of care within the geographic area requested by the patient (or if unable, by the patient's family).  Yes   Patient/family informed of their freedom to choose among providers that offer the needed level of care, that participate in Medicare, Medicaid or managed care program needed by the patient, have an available bed and are willing to accept the patient.  Yes   Patient/family informed of [place]'s ownership interest in [facility] and [facility], as well as of the fact that they are under no obligation to receive care at these facilities.  PASRR submitted to EDS on 08/01/16     PASRR number received on 08/03/16     Existing PASRR number confirmed on       FL2 transmitted to all facilities in geographic area requested by pt/family on 08/01/16     FL2 transmitted to all facilities within larger geographic area on       Patient informed that his/her managed care company has contracts with or will negotiate with certain facilities, including the following:        Yes   Patient/family informed of bed offers received.  Patient chooses bed at  [facility] )     Physician recommends and patient chooses bed at      Patient to be transferred to  [facility] ) on 08/04/16.  Patient to be transferred to facility by  [facility] )     Patient family notified on 08/04/16 of transfer.  Name of family member notified:   (Patient's daughter [name] and husband [name] are at bedside and aware of D/C  today. )     PHYSICIAN       Additional Comment:    _______________________________________________ [name], [name], LCSW 08/04/2016, 2:41 PM

## 2016-08-04 NOTE — Progress Notes (Signed)
Report given to English as a second language teacher at [facility]. All questions answered. Patient family aware of discharge. Patient just finished PT and sat on the side of the bed. EMS called for transport.   Deri Fuelling, RN

## 2016-08-04 NOTE — Plan of Care (Signed)
Problem: Activity: Goal: Ability to ambulate and perform ADLs will improve Outcome: Not Progressing Patient husband concerned of staff moving patient because he is afraid of the patient's leg being injured again. RN explained to patient's husband the benefits of patient moving for patient's skin, lungs, leg, and to prevent blood clots. RN will notify orthopedic doctor. PT was not able to work with patient until he patient's husband speaks to orthopedic doctor.  Deri Fuelling, RN

## 2016-08-04 NOTE — Progress Notes (Signed)
Patient is medically stable for D/C to [facility] today. Per Neoma Laming admissions coordinator at [facility] patient can come today to private room 109. RN will call report to [facility] and arrange EMS for transport. Clinical Education officer, museum (CSW) sent D/C orders to [facility] via [place]. Patient's daughter [name] and husband Mr. Tatem are at bedside and aware of D/C today. Please reconsult if future social work needs arise. CSW signing off.   [employer], LCSW [phone]

## 2016-08-04 NOTE — Evaluation (Signed)
Physical Therapy Evaluation Patient Details Name: Melinda Hughes MRN: [id number] DOB: Nov 09, 1931 Today's Date: 08/04/2016   History of Present Illness  Pt is an 83 y.o. female presenting to hospital s/p fall sustaining small L frontal scalp hematoma and comminuted mildly displaced distal L femoral fx.  Pt s/p ORIF distal L femur fx 08/01/16.  PMH includes dementia, DM, htn, uterine CA.  Clinical Impression  Prior to hospital admission, pt was ambulating independently without an assistive device.  Pt lives at [facility]  Currently pt is requiring total assist with bed mobility.  Pt demonstrating significant posterior lean sitting edge of bed requiring fluctuating max to total assist x2 with various cueing for upright positioning (pt appearing fearful to lean forward and sit upright so focused on sitting activities to facilitate comfort with upright sitting posture during session).  Session's activities complicated d/t pt's cognition requiring various strategies for participation.  Pt would benefit from skilled PT to address noted impairments and functional limitations (see below for any additional details).  Upon hospital discharge, recommend pt discharge to [place].    Follow Up Recommendations SNF    Equipment Recommendations   (TBD at next facility)    Recommendations for Other Services       Precautions / Restrictions Precautions Precautions: Fall Required Braces or Orthoses: Knee Immobilizer - Left Knee Immobilizer - Left: On at all times Restrictions Weight Bearing Restrictions: Yes LLE Weight Bearing: Non weight bearing Other Position/Activity Restrictions: Per ortho order, pt may only tolerate bed to chair with slideboard.      Mobility  Bed Mobility Overal bed mobility: Needs Assistance Bed Mobility: Supine to Sit;Sit to Supine     Supine to sit: Total assist;+2 for physical assistance;HOB elevated Sit to supine: Total assist;+2 for physical assistance;HOB elevated   General  bed mobility comments: assist for trunk and B LE's  Transfers                 General transfer comment: Deferred d/t significant assist levels required for sitting edge of bed  Ambulation/Gait             General Gait Details: Deferred d/t significant assist levels required for sitting edge of bed  Stairs            Wheelchair Mobility    Modified Rankin (Stroke Patients Only)       Balance Overall balance assessment: History of Falls;Needs assistance Sitting-balance support: Bilateral upper extremity supported (L LE supported (for safety and to maintain precautions)) Sitting balance-Leahy Scale: Zero Sitting balance - Comments: Fluctuating between max assist x2 and total assist x2 d/t pt leaning backwards Postural control: Posterior lean                                   Pertinent Vitals/Pain Pain Assessment: Faces Pain Score: 6  Pain Location: L LE Pain Descriptors / Indicators: Grimacing;Guarding (with L LE movement (no pain noted at rest)) Pain Intervention(s): Limited activity within patient's tolerance;Monitored during session;Repositioned  Vitals (HR and O2 on room air) stable and WFL throughout treatment session.    Home Living Family/patient expects to be discharged to:: Assisted living                 Additional Comments: Per pt's daughter, pt lives at [facility].    Prior Function Level of Independence: Needs assistance   Gait / Transfers Assistance Needed: Pt ambulates without AD.  ADL's / Homemaking Assistance Needed: Assist with dressing, bathing, meals, medications.  Comments: Pt was ambulatory independently and able to navigate stairs with assist for safety.     Hand Dominance        Extremity/Trunk Assessment   Upper Extremity Assessment Upper Extremity Assessment: Overall WFL for tasks assessed    Lower Extremity Assessment Lower Extremity Assessment: LLE deficits/detail (R LE AROM WFL) LLE  Deficits / Details: L LE KI donned throughout session (not assessed)       Communication   Communication: No difficulties  Cognition Arousal/Alertness: Awake/alert Behavior During Therapy: Anxious Overall Cognitive Status: History of cognitive impairments - at baseline (Oriented to person)                                        General Comments General comments (skin integrity, edema, etc.): L KI donned throughout session.  Nursing cleared pt for participation in physical therapy.  Pt's husband and daughter agreeable to PT session and both left room for therapy session.  PT Supervisor Tera Helper present assisting with all functional mobility activities during session.    Exercises  Sitting balance x8 minutes   Assessment/Plan    PT Assessment Patient needs continued PT services  PT Problem List Decreased activity tolerance;Decreased balance;Decreased mobility;Decreased strength;Decreased range of motion;Decreased knowledge of use of DME;Decreased knowledge of precautions;Pain       PT Treatment Interventions DME instruction;Gait training;Functional mobility training;Therapeutic activities;Therapeutic exercise;Balance training;Patient/family education;Wheelchair mobility training    PT Goals (Current goals can be found in the Care Plan section)  Acute Rehab PT Goals Patient Stated Goal: to go to rehab to improve mobility PT Goal Formulation: With family Time For Goal Achievement: 08/18/16 Potential to Achieve Goals: Fair    Frequency BID   Barriers to discharge Decreased caregiver support      Co-evaluation               AM-PAC PT "6 Clicks" Daily Activity  Outcome Measure Difficulty turning over in bed (including adjusting bedclothes, sheets and blankets)?: Total Difficulty moving from lying on back to sitting on the side of the bed? : Total Difficulty sitting down on and standing up from a chair with arms (e.g., wheelchair, bedside commode,  etc,.)?: Total   Help needed walking in hospital room?: Total Help needed climbing 3-5 steps with a railing? : Total 6 Click Score: 5    End of Session Equipment Utilized During Treatment: Left knee immobilizer Activity Tolerance: Patient tolerated treatment well Patient left: in bed;with call bell/phone within reach;with bed alarm set;with family/visitor present Nurse Communication: Mobility status;Precautions;Weight bearing status PT Visit Diagnosis: History of falling (Z91.81);Difficulty in walking, not elsewhere classified (R26.2);Pain Pain - Right/Left: Left Pain - part of body: Leg    Time: 4665-9935 PT Time Calculation (min) (ACUTE ONLY): 27 min   Charges:   PT Evaluation $PT Eval Low Complexity: 1 Procedure PT Treatments $Therapeutic Activity: 8-22 mins   PT G CodesLeitha Bleak, PT 08/04/16, 3:24 PM [phone]

## 2016-08-04 NOTE — Progress Notes (Signed)
PASARR has been received, [id number]. Plan is for patient to D/C to [facility] when medially stable.   [employer], LCSW [phone]

## 2016-08-04 NOTE — Care Management Important Message (Signed)
Important Message  Patient Details  Name: Darnisha Vernet MRN: [id number] Date of Birth: 1931/07/31   Medicare Important Message Given:  Yes    Jolly Mango, RN 08/04/2016, 2:24 PM

## 2016-08-04 NOTE — Progress Notes (Signed)
  Subjective:  POD #3 s/p ORIF of left distal femur fracture. Patient has advanced dementia and is unable to provide history. Her family including her daughter and husband are at the bedside.   Objective:   VITALS:   Vitals:   08/03/16 2107 08/04/16 0507 08/04/16 0748 08/04/16 1011  BP: (!) 156/79 (!) 152/70 (!) 151/69   Pulse: 90 76 (!) 52   Resp: 18  16   Temp: 98.9 F (37.2 C)  99.1 F (37.3 C) 99.1 F (37.3 C)  TempSrc: Oral  Oral Oral  SpO2: 99% 99% 95%   Weight:      Height:        PHYSICAL EXAM: Left lower extremity: Dressing changed yesterday. Patient is awake and in no acute distress. Knee immobilizer in place left lower extremity Cannot assess motor and sensory function. Intact pulses distally  LABS  Results for orders placed or performed during the hospital encounter of 07/31/16 (from the past 24 hour(s))  Prepare RBC     Status: None   Collection Time: 08/03/16  3:30 PM  Result Value Ref Range   Order Confirmation ORDER PROCESSED BY BLOOD BANK   Type and screen     Status: None   Collection Time: 08/03/16  4:23 PM  Result Value Ref Range   ABO/RH(D) O POS    Antibody Screen NEG    Sample Expiration 08/06/2016    Unit Number [id number]    Blood Component Type RED CELLS,LR    Unit division 00    Status of Unit ISSUED,FINAL    Transfusion Status OK TO TRANSFUSE    Crossmatch Result Compatible   CBC     Status: Abnormal   Collection Time: 08/04/16  5:53 AM  Result Value Ref Range   WBC 6.2 3.6 - 11.0 K/uL   RBC 3.21 (L) 3.80 - 5.20 MIL/uL   Hemoglobin 9.1 (L) 12.0 - 16.0 g/dL   HCT 27.2 (L) 35.0 - 47.0 %   MCV 84.8 80.0 - 100.0 fL   MCH 28.3 26.0 - 34.0 pg   MCHC 33.4 32.0 - 36.0 g/dL   RDW 15.2 (H) 11.5 - 14.5 %   Platelets 187 150 - 440 K/uL  Potassium     Status: Abnormal   Collection Time: 08/04/16  5:53 AM  Result Value Ref Range   Potassium 3.1 (L) 3.5 - 5.1 mmol/L  Glucose, capillary     Status: Abnormal   Collection Time: 08/04/16   7:43 AM  Result Value Ref Range   Glucose-Capillary 130 (H) 65 - 99 mg/dL  Glucose, capillary     Status: Abnormal   Collection Time: 08/04/16 11:53 AM  Result Value Ref Range   Glucose-Capillary 177 (H) 65 - 99 mg/dL    No results found.  Assessment/Plan: 3 Days Post-Op   Active Problems:   Closed left subtrochanteric femur fracture, initial encounter ([place])   Patient stable postop. Her hemoglobin has improved following transfusion yesterday. Patient had a bowel movement. She has being prepared for discharge to rehabilitation. She'll follow up with me in 2 weeks in the office for staple removal and x-ray. Patient will remain nonweightbearing on the left lower extremity for 6 weeks postop.  Patient may perform bedside exercises on her right knee at rehabilitation. She should wear the knee immobilizer when lying in bed.  Continue Lovenox for DVT prophylaxis.   Thornton Park , MD 08/04/2016, 1:48 PM

## 2016-08-04 NOTE — Discharge Summary (Signed)
[name] NAME: Melinda Hughes    MR#:  [id number]  DATE OF BIRTH:  [birth year]  DATE OF ADMISSION:  07/31/2016 ADMITTING PHYSICIAN: Epifanio Lesches, MD  DATE OF DISCHARGE: 08/04/16  PRIMARY CARE PHYSICIAN: Kirk Ruths, MD    ADMISSION DIAGNOSIS:  Pain [R52] Femur fracture, right ([name]) [S72.91XA] Fall, subsequent encounter [W19.XXXD] Closed fracture of distal end of femur, unspecified fracture morphology, initial encounter ([name]) [S72.409A]  DISCHARGE DIAGNOSIS:  Left distal Femur fracture postop day 3 Post surgery anemia status post blood transfusion Advanced dementia SECONDARY DIAGNOSIS:   Past Medical History:  Diagnosis Date  . Arthritis   . Dementia   . Diabetes mellitus without complication ([name])   . Hypertension   . Uterine cancer [name])     HOSPITAL COURSE:   Melinda Hughes a 83 y.o.femalewith profound dementia presented from her nursing facility after she was noted not to be weightbearing on the left lower extremity. Despite her dementia she isambulatory at baseline  * Left femur fracture POD# 3 S/p ORIF, manage per ortho. Rehab. PT to be started -hgb 10.0---7.5-- 1 unit BT--9.5  * Hypertension Under control now. Cont home meds  * DM Stable on ISS.  * dementia -Cont seroquel  * low grade fever Appears post of fever. UA and cxr ok  * poor po intake--improving diet -encourage intermittently to eat and drink. Has significant dementia also   Patient will discharge to [facility] for rehabilitation spoke with patient's husband and daughter in the room Dr. Mack Guise is okay from orthopedic standpoint for patient to transfer CONSULTS OBTAINED:  Treatment Team:  Thornton Park, MD  DRUG ALLERGIES:   Allergies  Allergen Reactions  . Codeine Itching  . Fluzone [Flu Virus Vaccine]     Unknown reaction. Received info from nursing home  . Morphine And Related  Itching  . Sulfa Antibiotics Itching    DISCHARGE MEDICATIONS:   Current Discharge Medication List    START taking these medications   Details  enoxaparin (LOVENOX) 40 MG/0.4ML injection Inject 0.4 mLs (40 mg total) into the skin daily. Qty: 14 Syringe, Refills: 0    ferrous sulfate 325 (65 FE) MG tablet Take 1 tablet (325 mg total) by mouth 3 (three) times daily after meals. Qty: 90 tablet, Refills: 3    hydrALAZINE (APRESOLINE) 25 MG tablet Take 1 tablet (25 mg total) by mouth every 8 (eight) hours. Qty: 90 tablet, Refills: 0      CONTINUE these medications which have NOT CHANGED   Details  acetaminophen (TYLENOL) 325 MG tablet Take 2 tablets (650 mg total) by mouth every 6 (six) hours as needed for mild pain, fever or headache (or Fever >/= 100.5). Qty: 30 tablet, Refills: 0    bisacodyl (DULCOLAX) 10 MG suppository Place 1 suppository (10 mg total) rectally daily as needed for moderate constipation. Qty: 12 suppository, Refills: 0    docusate sodium (COLACE) 100 MG capsule Take 100 mg by mouth daily.    nystatin cream (MYCOSTATIN) Apply 1 application topically 3 (three) times daily.    polyethylene glycol (MIRALAX / GLYCOLAX) packet Take 17 g by mouth daily.        If you experience worsening of your admission symptoms, develop shortness of breath, life threatening emergency, suicidal or homicidal thoughts you must seek medical attention immediately by calling 911 or calling your MD immediately  if symptoms less severe.  You Must read complete instructions/literature along with all  the possible adverse reactions/side effects for all the Medicines you take and that have been prescribed to you. Take any new Medicines after you have completely understood and accept all the possible adverse reactions/side effects.   Please note  You were cared for by a hospitalist during your hospital stay. If you have any questions about your discharge medications or the care you received  while you were in the hospital after you are discharged, you can call the unit and asked to speak with the hospitalist on call if the hospitalist that took care of you is not available. Once you are discharged, your primary care physician will handle any further medical issues. Please note that NO REFILLS for any discharge medications will be authorized once you are discharged, as it is imperative that you return to your primary care physician (or establish a relationship with a primary care physician if you do not have one) for your aftercare needs so that they can reassess your need for medications and monitor your lab values. Today   SUBJECTIVE   Confused At much better per nursing staff  VITAL SIGNS:  Blood pressure (!) 151/69, pulse (!) 52, temperature 99.1 F (37.3 C), temperature source Oral, resp. rate 16, height 5\' 5"  (1.651 m), weight 71.7 kg (158 lb), SpO2 95 %.  I/O:   Intake/Output Summary (Last 24 hours) at 08/04/16 1308 Last data filed at 08/04/16 0800  Gross per 24 hour  Intake             4250 ml  Output                0 ml  Net             4250 ml    PHYSICAL EXAMINATION:  GENERAL:  83 y.o.-year-old patient lying in the bed with no acute distress.  EYES: Pupils equal, round, reactive to light and accommodation. No scleral icterus. Extraocular muscles intact.  HEENT: Head atraumatic, normocephalic. Oropharynx and nasopharynx clear.  NECK:  Supple, no jugular venous distention. No thyroid enlargement, no tenderness.  LUNGS: Normal breath sounds bilaterally, no wheezing, rales,rhonchi or crepitation. No use of accessory muscles of respiration.  CARDIOVASCULAR: S1, S2 normal. No murmurs, rubs, or gallops.  ABDOMEN: Soft, non-tender, non-distended. Bowel sounds present. No organomegaly or mass.  EXTREMITIES: No pedal edema, cyanosis, or clubbing.  NEUROLOGIC: Moves extremities well. Unable to do detailed exam due to severe dementia PSYCHIATRIC: The patient is alert and  confused due to dementia.  SKIN: No obvious rash, lesion, or ulcer.   DATA REVIEW:   CBC   Recent Labs Lab 08/04/16 0553  WBC 6.2  HGB 9.1*  HCT 27.2*  PLT 187    Chemistries   Recent Labs Lab 07/31/16 1148  08/03/16 0425  NA 137  < > 144  K 4.0  < > 3.1*  CL 104  < > 112*  CO2 27  < > 27  GLUCOSE 175*  < > 140*  BUN 17  < > 17  CREATININE 0.70  < > 0.58  CALCIUM 9.1  < > 8.3*  AST 23  --   --   ALT 13*  --   --   ALKPHOS 72  --   --   BILITOT 0.8  --   --   < > = values in this interval not displayed.  Microbiology Results   Recent Results (from the past 240 hour(s))  MRSA PCR Screening     Status: None  Collection Time: 07/31/16  4:52 PM  Result Value Ref Range Status   MRSA by PCR NEGATIVE NEGATIVE Final    Comment:        The GeneXpert MRSA Assay (FDA approved for NASAL specimens only), is one component of a comprehensive MRSA colonization surveillance program. It is not intended to diagnose MRSA infection nor to guide or monitor treatment for MRSA infections.     RADIOLOGY:  No results found.   Management plans discussed with the patient, family and they are in agreement.  CODE STATUS:     Code Status Orders        Start     Ordered   07/31/16 1450  Full code  Continuous     07/31/16 1454    Code Status History    Date Active Date Inactive Code Status Order ID Comments User Context   05/15/2016  9:28 PM 05/18/2016  4:13 PM Full Code [id number]  Demetrios Loll, MD Inpatient   10/17/2015  5:55 PM 10/19/2015  5:37 PM DNR [id number]  Loletha Grayer, MD ED    Advance Directive Documentation     Most Recent Value  Type of Advance Directive  Healthcare Power of Attorney  Pre-existing out of facility DNR order (yellow form or pink MOST form)  -  "MOST" Form in Place?  -      TOTAL TIME TAKING CARE OF THIS PATIENT: *40* minutes.    Dejai Schubach M.D on 08/04/2016 at 1:08 PM  Between 7am to 6pm - Pager - [phone] After 6pm go to  [email or web address] - password [email or web address] [facility]  Office  [phone]  CC: Primary care physician; Kirk Ruths, MD

## 2016-08-04 NOTE — Progress Notes (Signed)
PT Cancellation Note  Patient Details Name: Melinda Hughes MRN: [id number] DOB: 11/01/1931   Cancelled Treatment:    Reason Eval/Treat Not Completed: Other (comment).  PT consult received.  Chart reviewed.  Pt's husband initially appearing very agreeable to PT eval (pt's husband then left room to speak with staff beginning of session) but after pt's room was set-up for functional mobility, pt's husband returned and suddenly verbalizing significant concern regarding pt participating in physical therapy.  Pt's husband verbalizing concerns about pt doing too much therapy too soon or too much activity in same day; also concerned about doing therapy same day as discharge to [facility].  Pt's husband verbalizing significant concerns regarding therapies ability to maintain pt's L LE precautions during therapy session d/t significant extent of pt's injuries and NWB'ing status;  Pt's husband educated on pt's precautions and focus of maintaining pt's safety and precautions during session (and how therapist would maintain them) but pt's husband still appearing concerned.  Pt's husband requesting to talk with MD Mack Guise prior to pt participating in any therapy today.  Nursing notified and reported she would page MD regarding this (SW and Geneticist, molecular also notified regarding above).   Leitha Bleak, PT 08/04/16, 11:55 AM [phone]

## 2018-04-22 IMAGING — CT CT HEAD W/O CM
3 of 6 series · 15 of 47 positions shown, 18 images · non-contrast
Comparison: 05/06/2016

CLINICAL DATA: Fall, dementia

EXAM:
CT HEAD WITHOUT CONTRAST
TECHNIQUE: Contiguous axial images were obtained from the base of the skull
through the vertex without intravenous contrast.

[Series 4: coronal soft tissue · coronal · 0.31mm/px · 3 of 61 slices shown]
[im 16/61  brain]
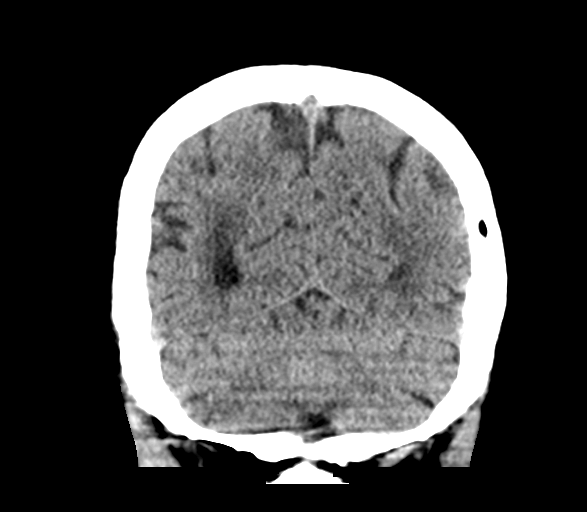
[im 31/61  brain]
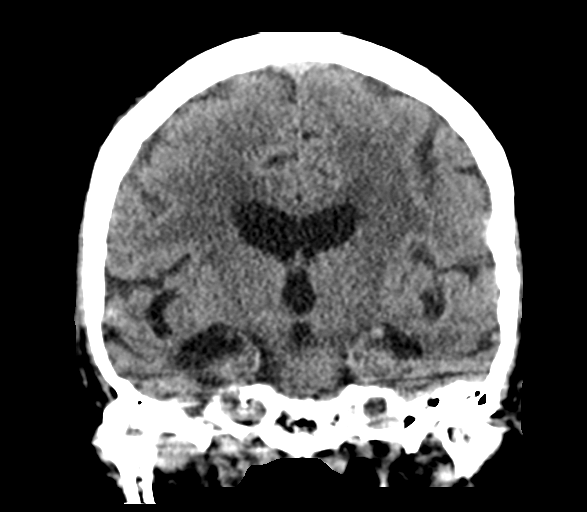
[im 46/61  brain]
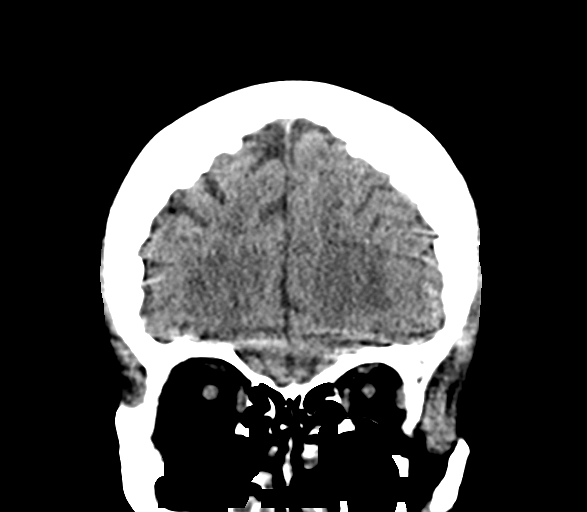

[Series 5: head wo · axial · 0.47mm/px · z∈[+368,+498]mm · 10 of 31 slices shown, 13 images]
[im 3/31  brain]
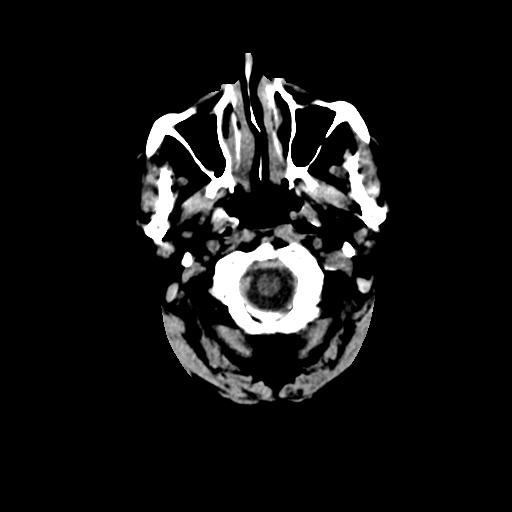
[im 3/31  bone]
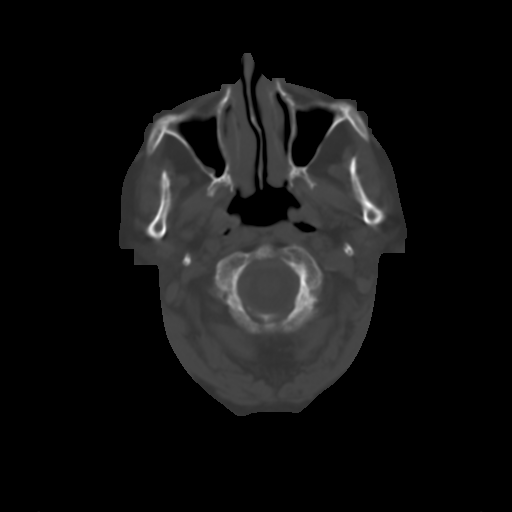
[im 7/31  brain]
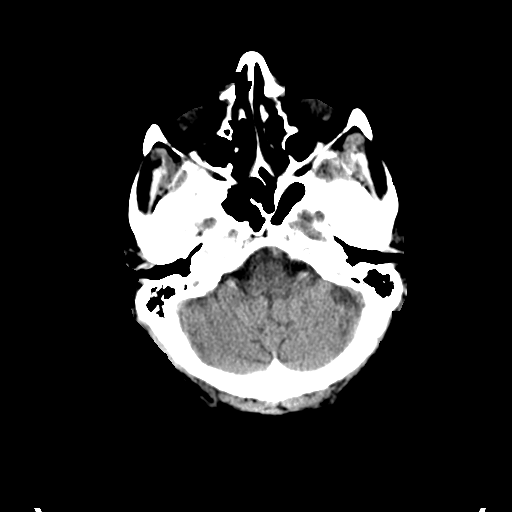
[im 9/31  brain]
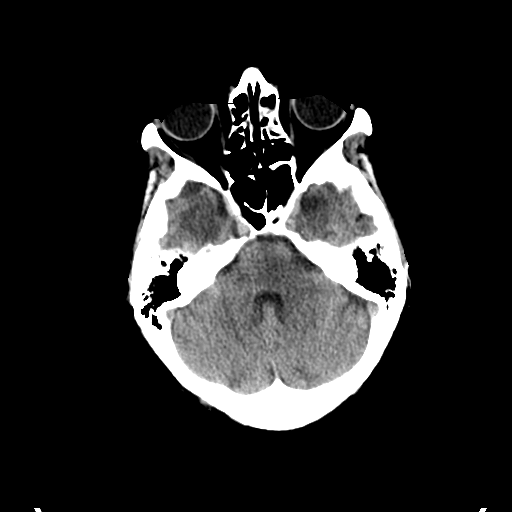
[im 11/31  brain]
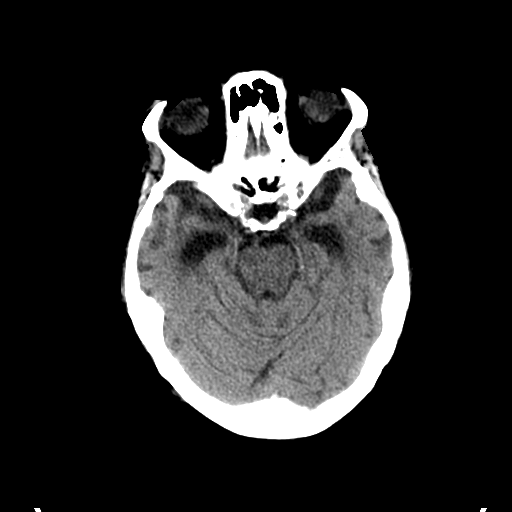
[im 15/31  brain]
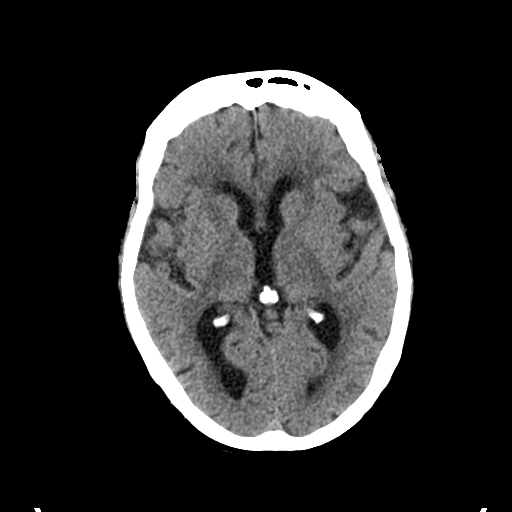
[im 15/31  bone]
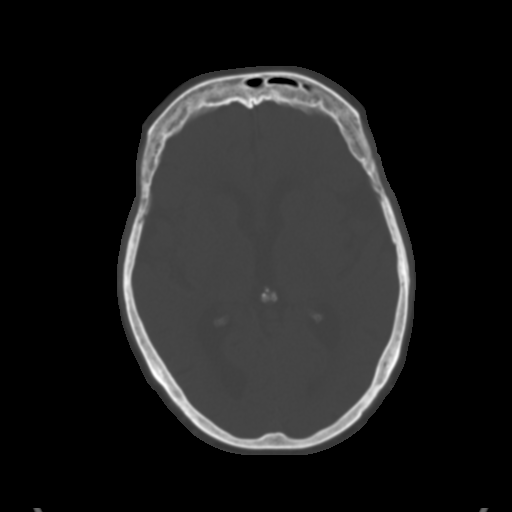
[im 17/31  brain]
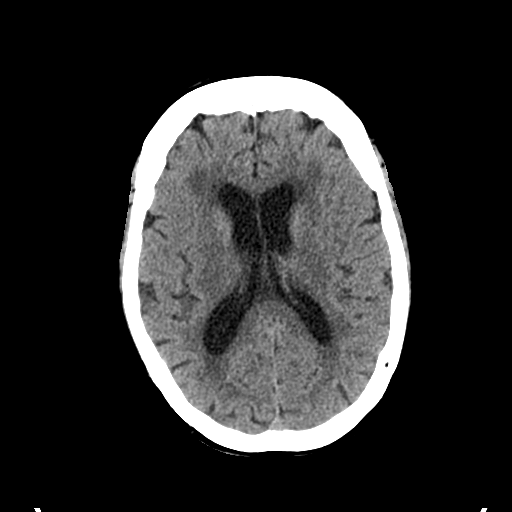
[im 21/31  brain]
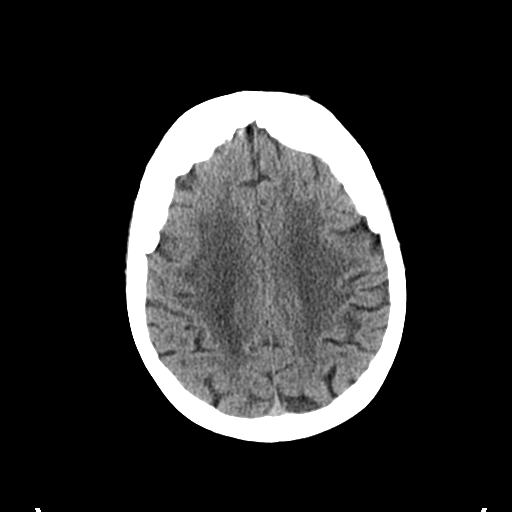
[im 23/31  brain]
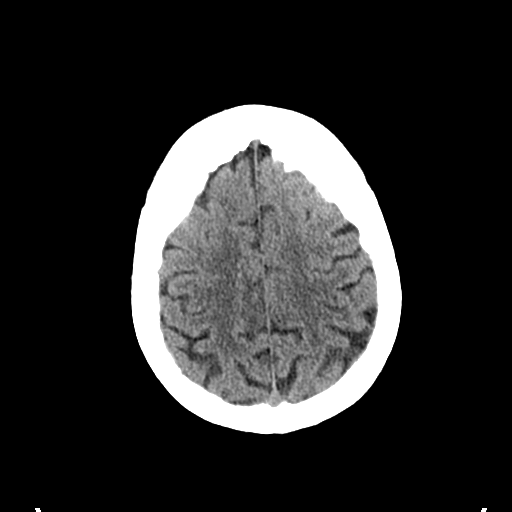
[im 25/31  brain]
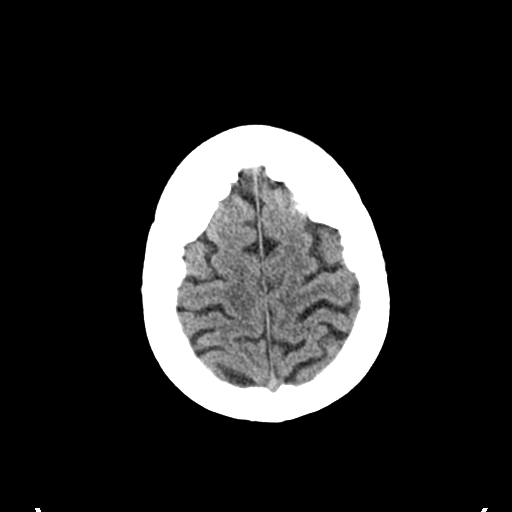
[im 25/31  bone]
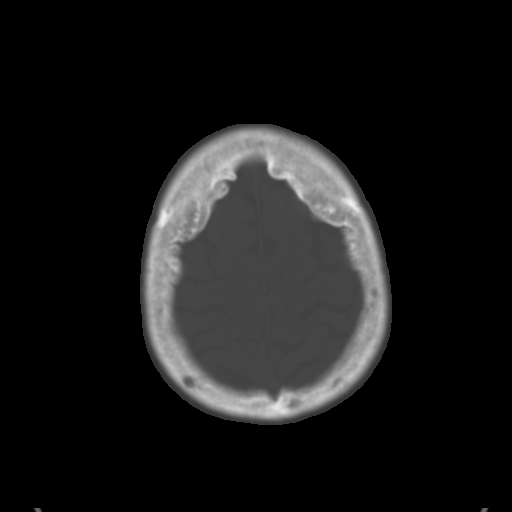
[im 29/31  brain]
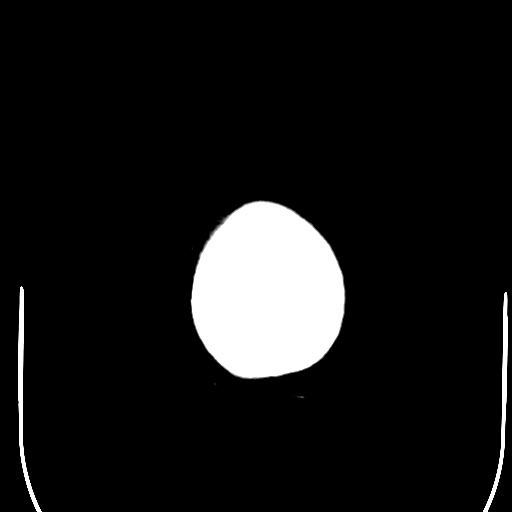

[Series 9: sagittal soft tissue · sagittal · 0.30mm/px · 2 of 48 slices shown]
[im 16/48  brain]
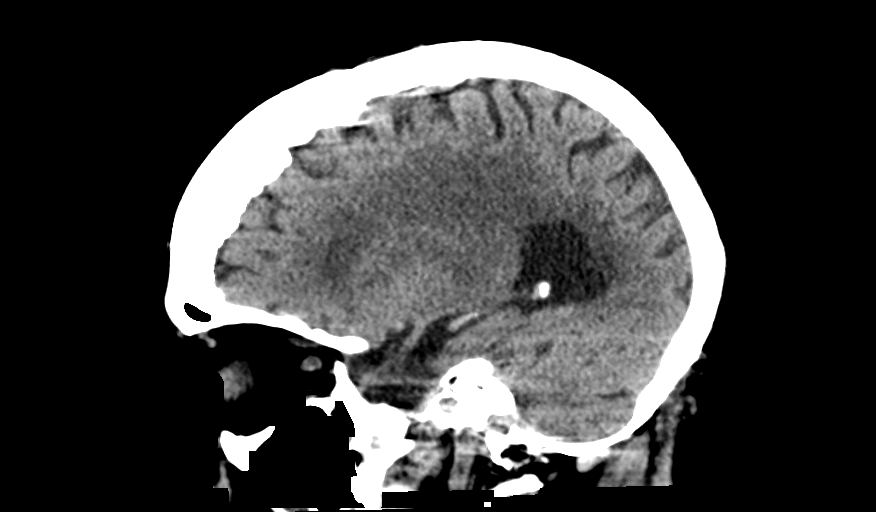
[im 32/48  brain]
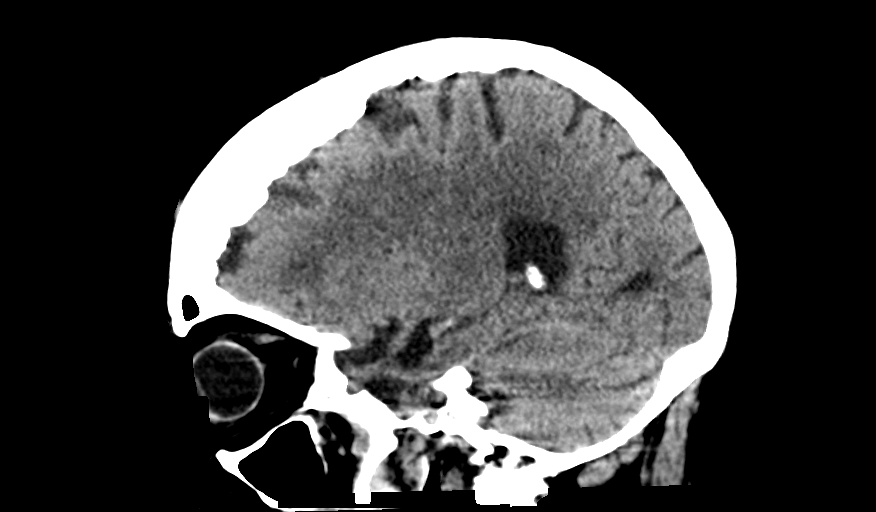

[15 of 47 positions shown; findings below may reference images not displayed]

FINDINGS: Brain: There is atrophy and chronic small vessel disease changes. No
acute intracranial abnormality. Specifically, no hemorrhage,
hydrocephalus, mass lesion, acute infarction, or significant
intracranial injury.

Vascular: No hyperdense vessel or unexpected calcification.

Skull: No acute calvarial abnormality.

Sinuses/Orbits: Mild mucosal thickening in the paranasal sinuses.
Mastoid air cells are clear.

Other: None
IMPRESSION: No acute intracranial abnormality.

Atrophy, chronic microvascular disease.

## 2018-07-08 IMAGING — CR DG FEMUR 2+V*R*
1 series · 4 of 4 positions shown · non-contrast
Comparison: None.

CLINICAL DATA: Fall, pain

EXAM:
RIGHT FEMUR 2 VIEWS

[Series 1: dg femur, min 2 views right · 0.14mm/px · 4 of 4 slices shown]
[im 1/4]
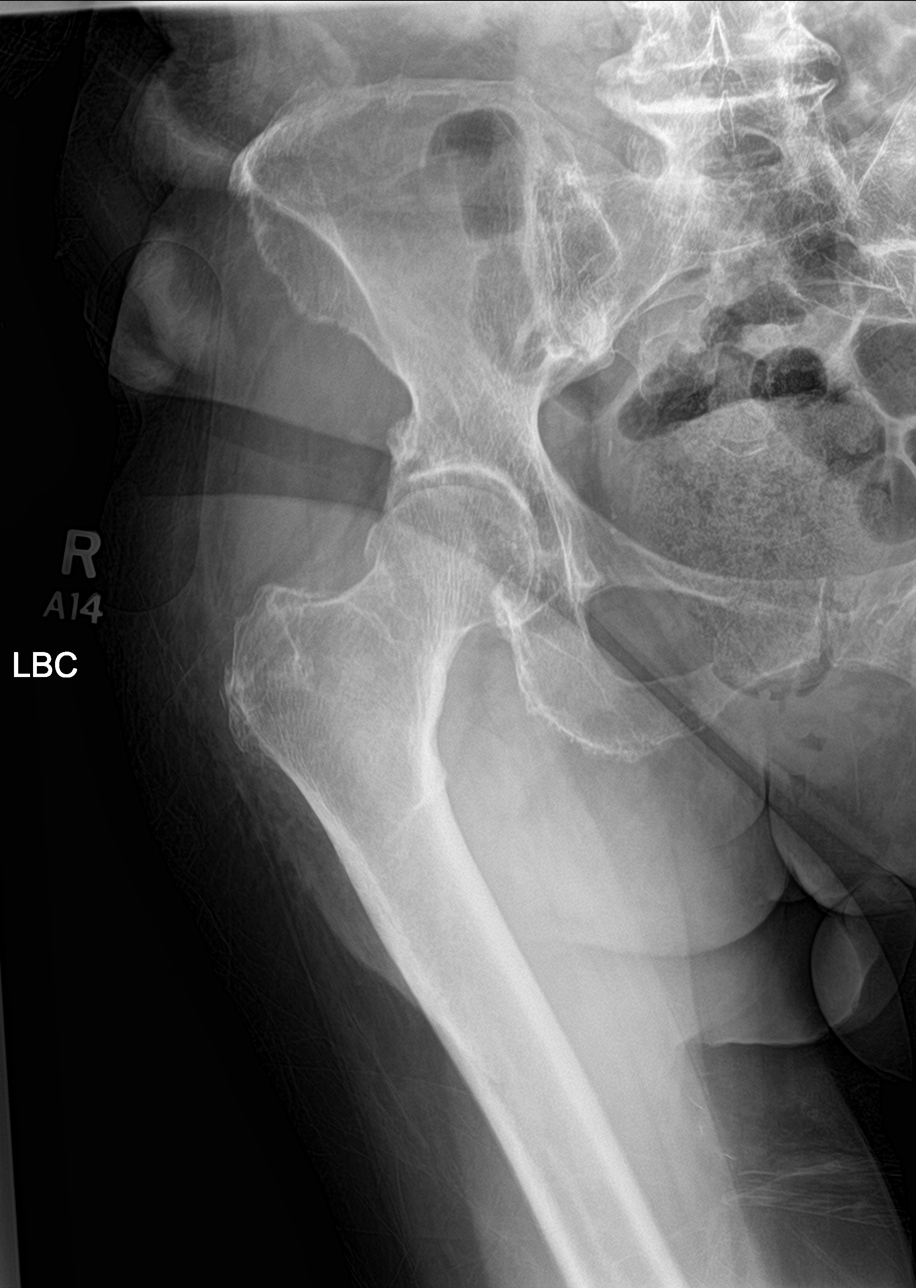
[im 2/4]
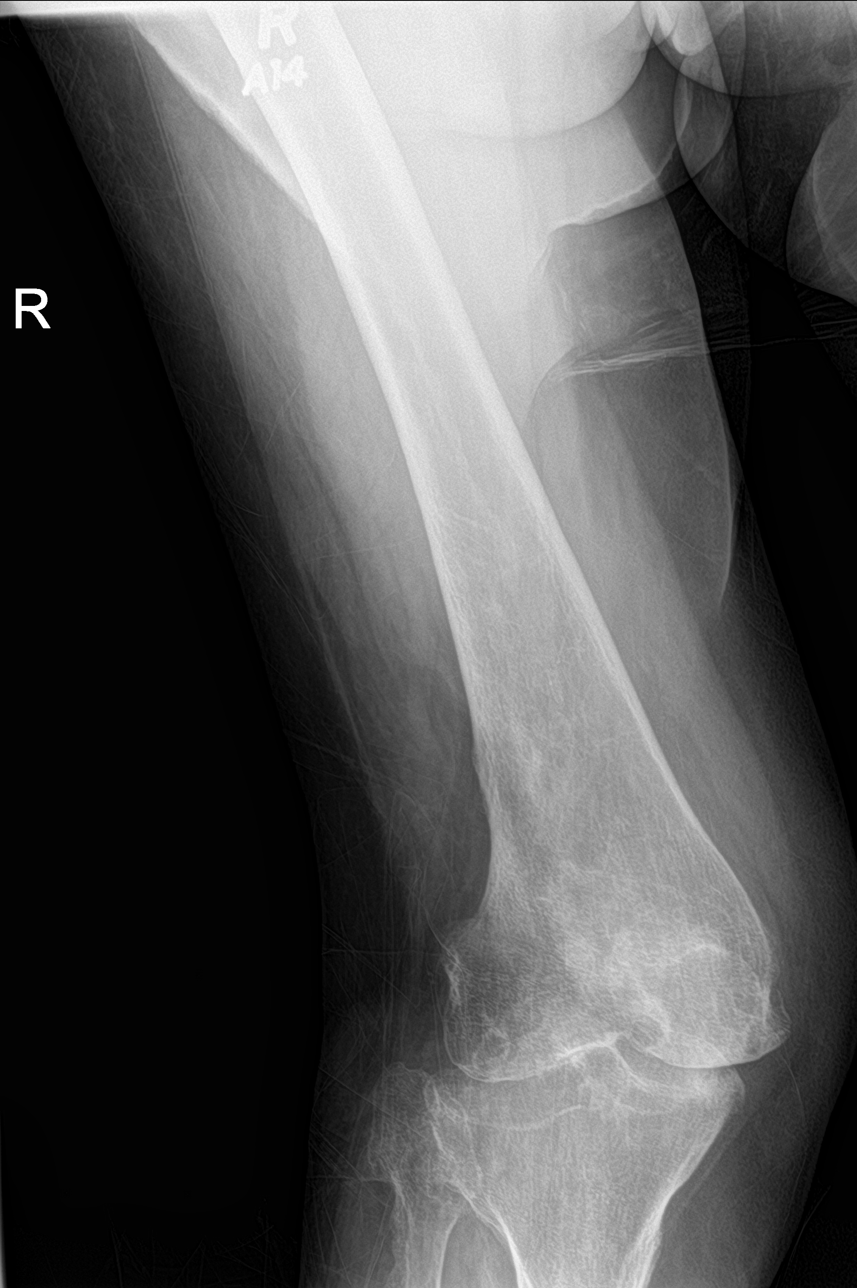
[im 3/4]
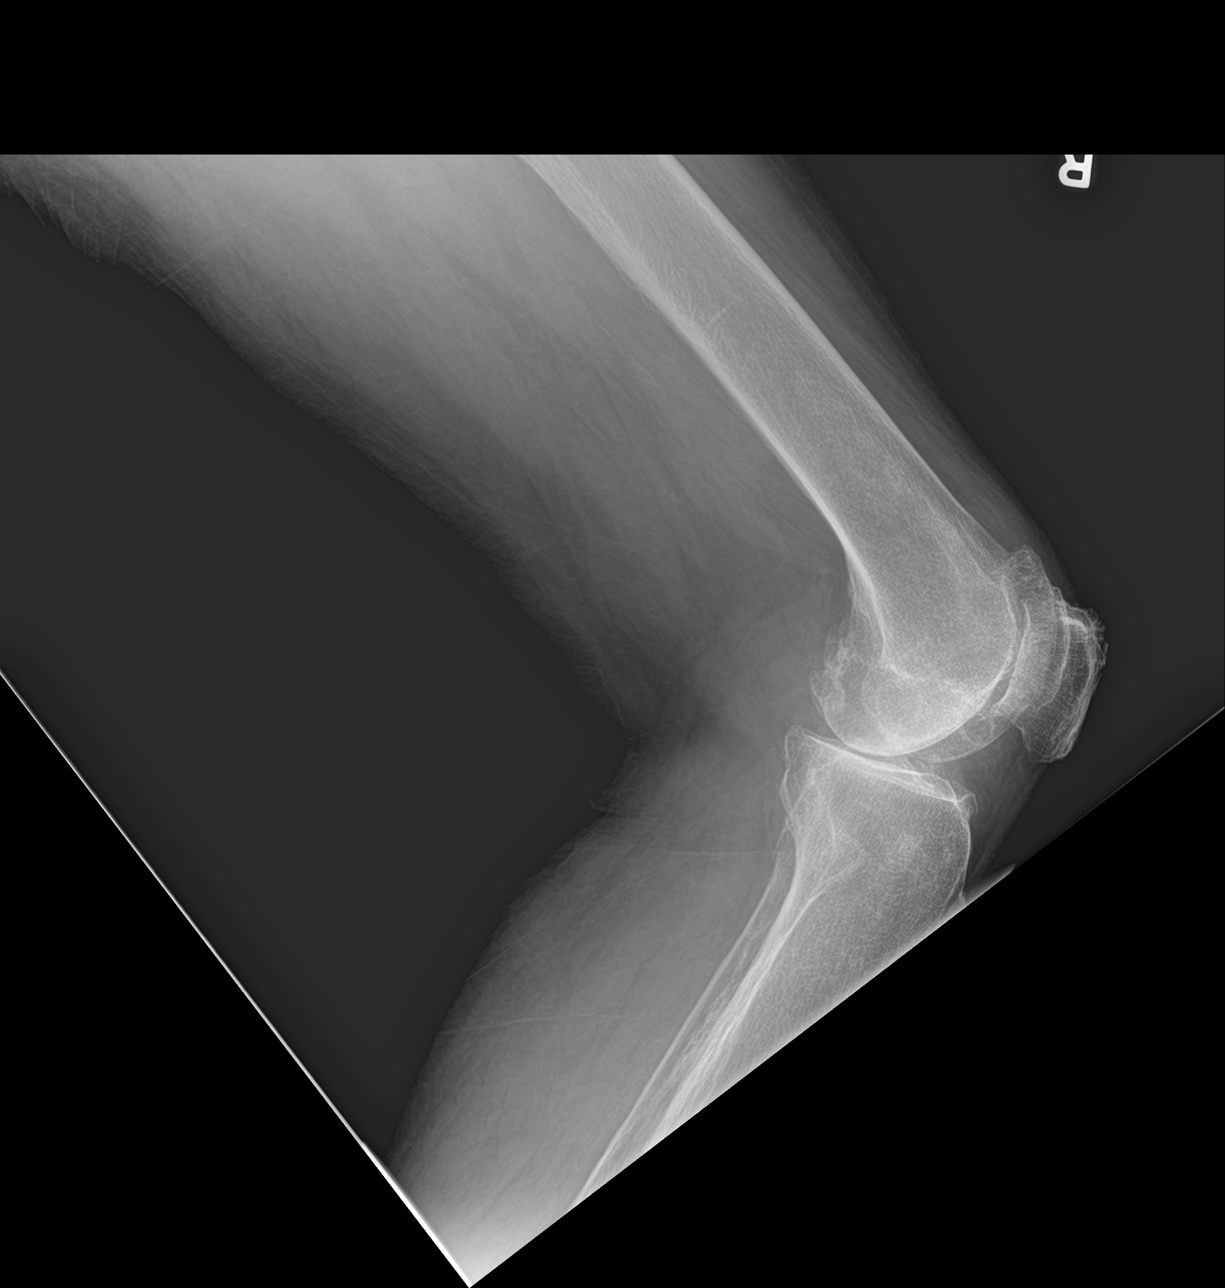
[im 4/4]
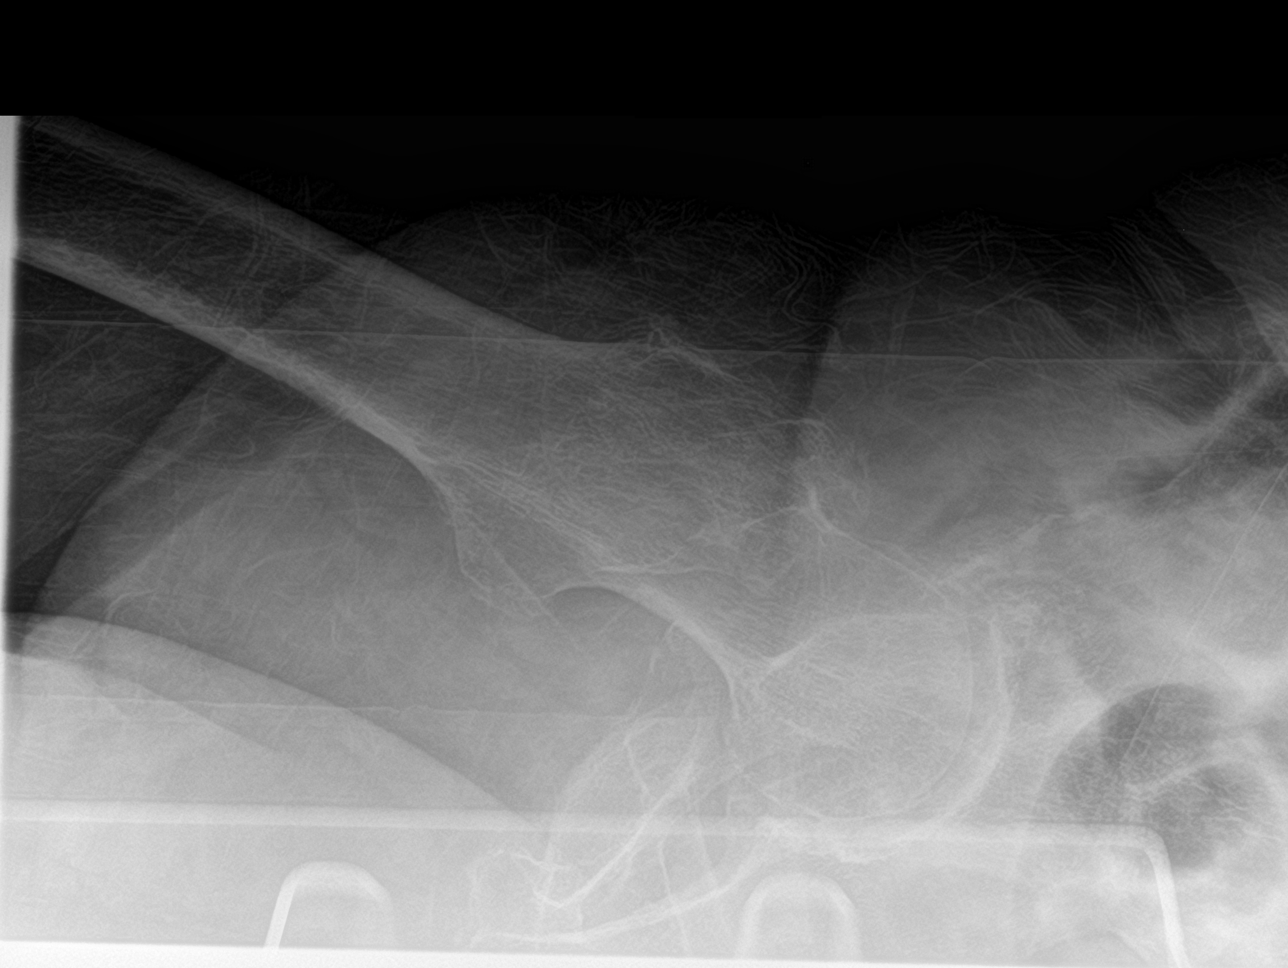

[4 of 4 positions shown; findings below may reference images not displayed]

FINDINGS: Advanced degenerative changes in the right knee. Mild degenerative
changes in the right hip. No acute bony abnormality. Specifically,
no fracture, subluxation, or dislocation. Soft tissues are intact.
No joint effusion.
IMPRESSION: No acute bony abnormality.

## 2018-07-08 IMAGING — CR DG FEMUR 2+V*L*
1 series · 4 of 4 positions shown · non-contrast
Comparison: None.

CLINICAL DATA: Fall a couple of days ago, patient has dementia,
unable to communicate, held by techs for images

EXAM:
LEFT FEMUR 2 VIEWS

[Series 1: dg femur min 2 views left · 0.14mm/px · 4 of 4 slices shown]
[im 1/4]
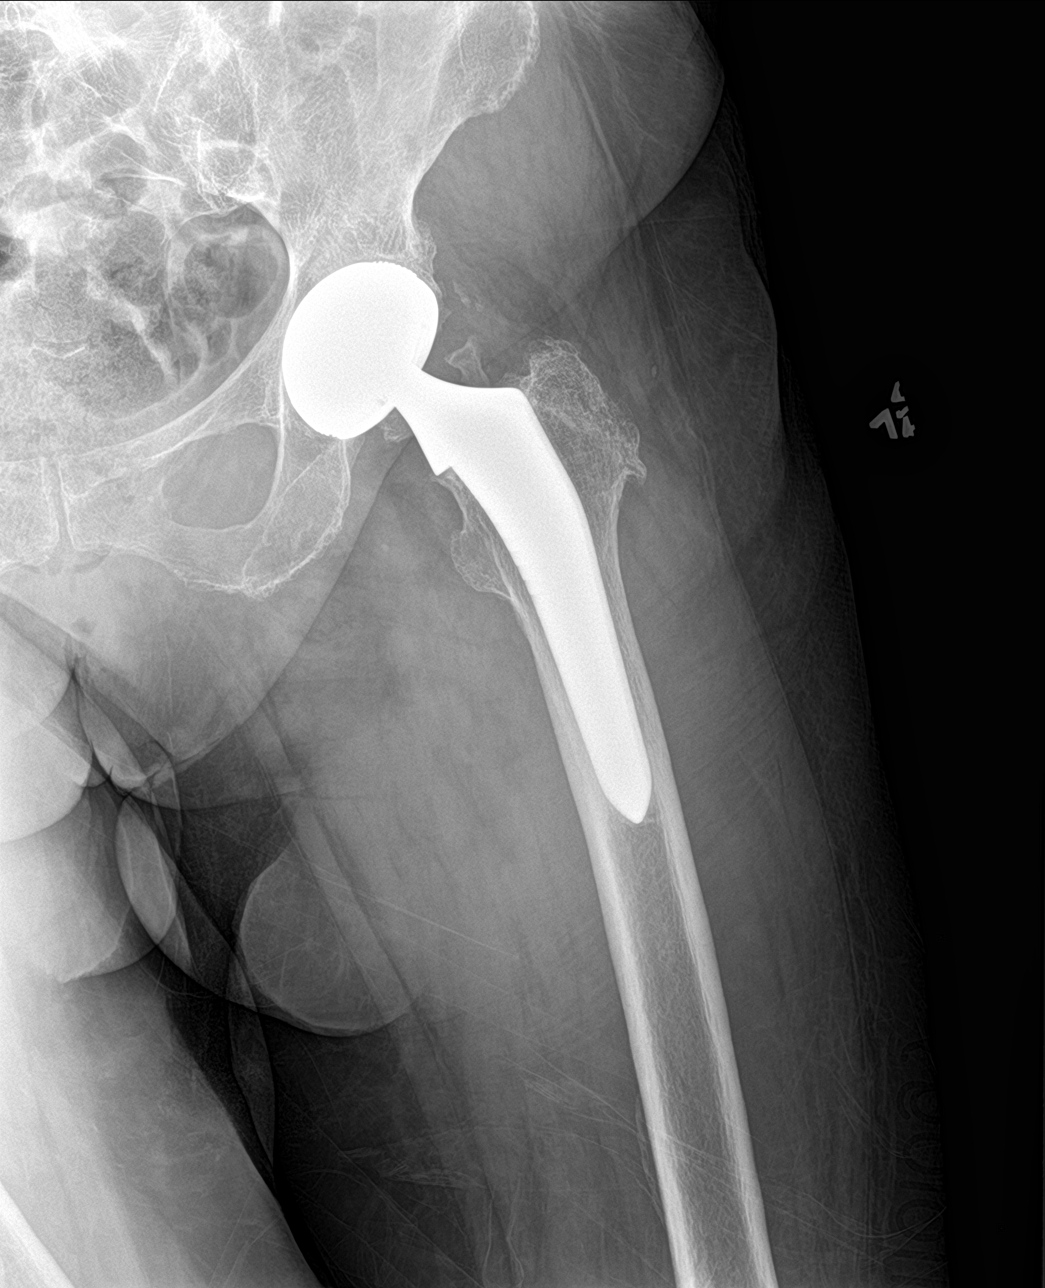
[im 2/4]
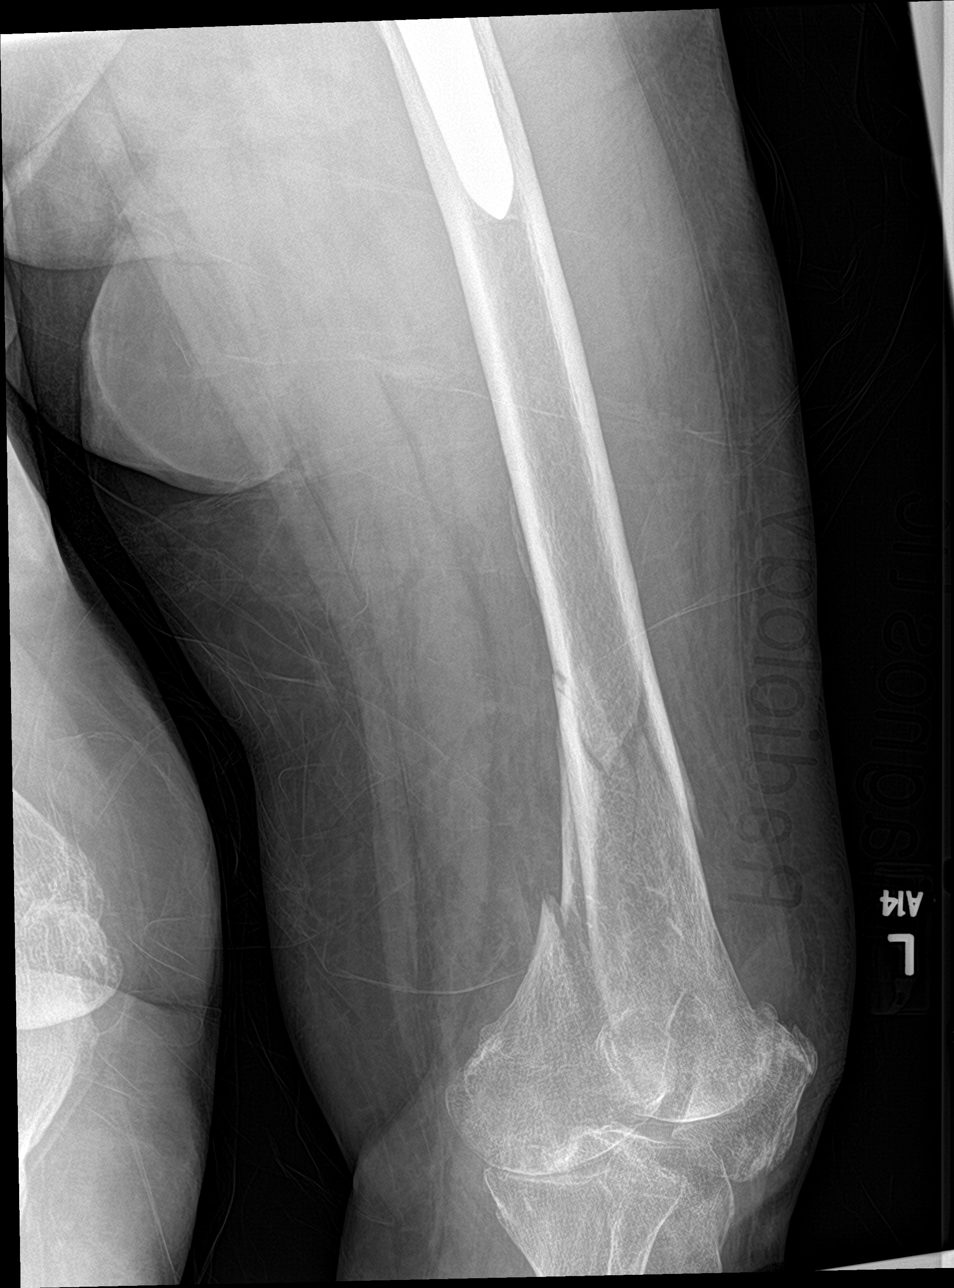
[im 3/4]
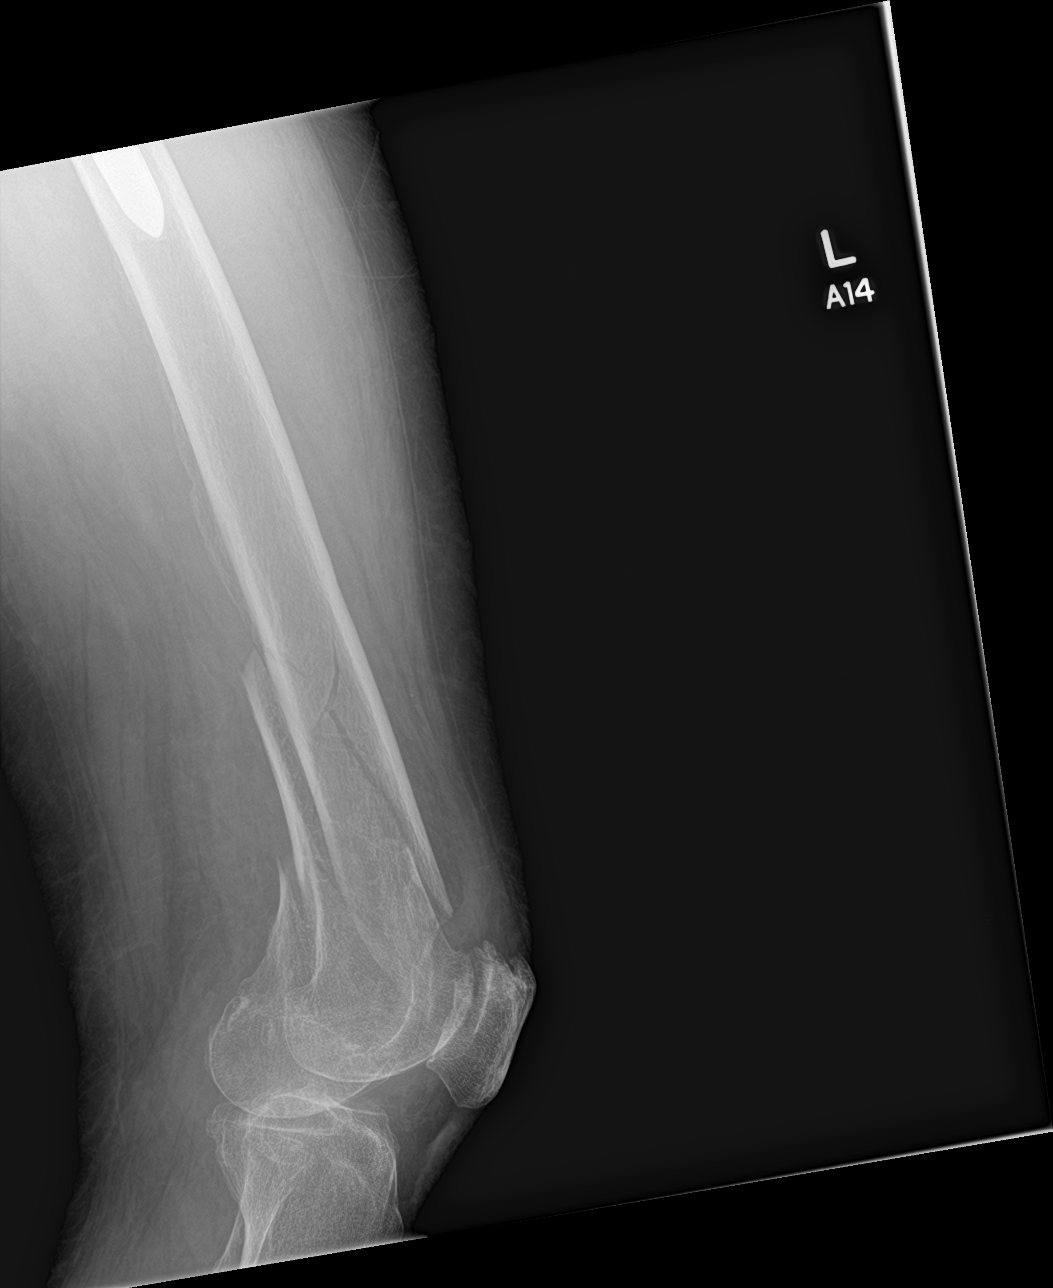
[im 4/4]
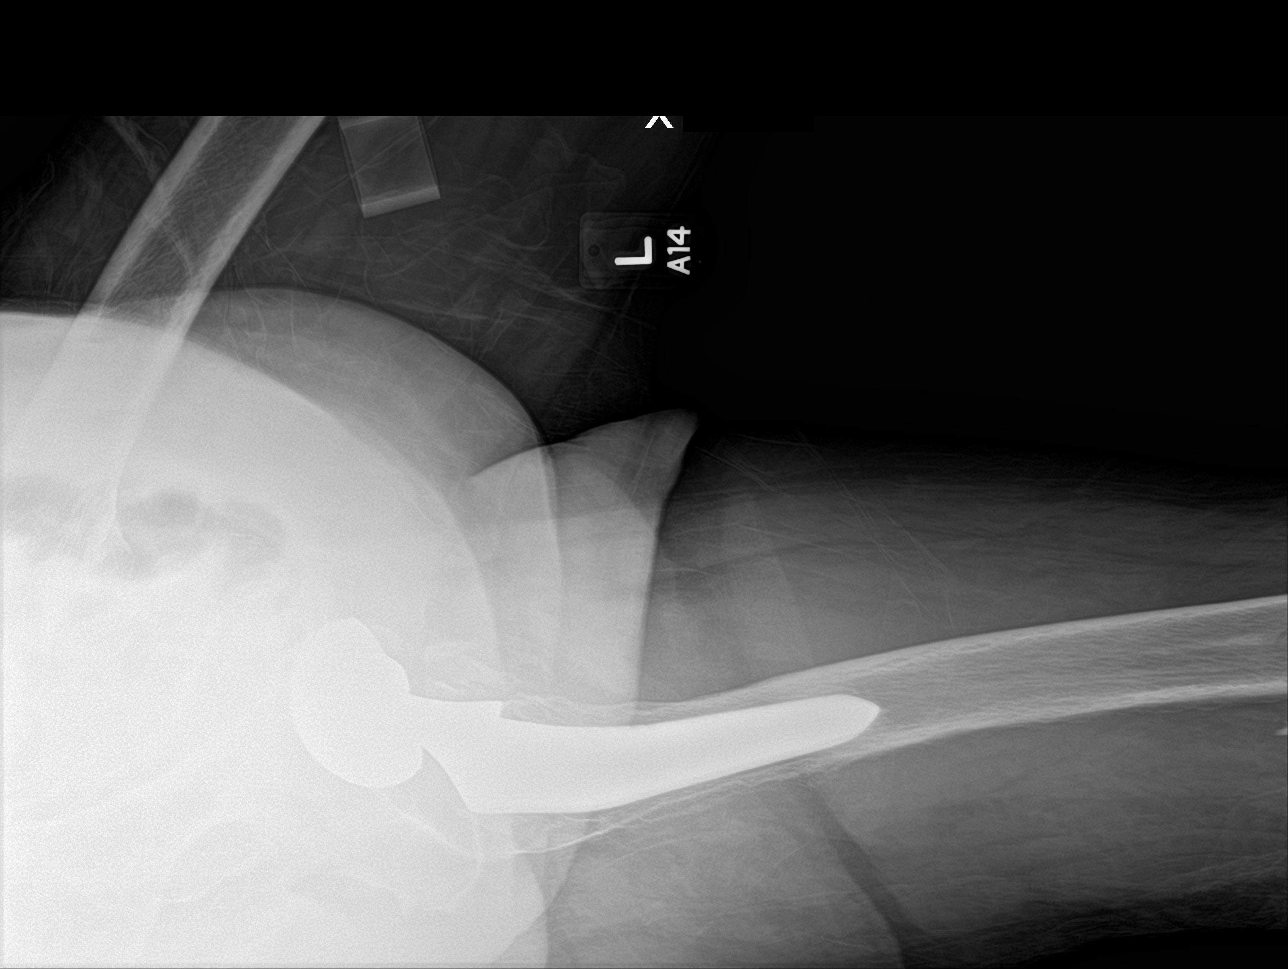

[4 of 4 positions shown; findings below may reference images not displayed]

FINDINGS: Prior left hip replacement. There is a comminuted mildly displaced
fracture noted through the distal metaphysis and likely entering the
knee joint in the region of the femoral notch. No subluxation or
dislocation.
IMPRESSION: Comminuted, mildly displaced distal left femoral fracture.

## 2018-08-14 ENCOUNTER — Inpatient Hospital Stay (HOSPITAL_COMMUNITY)
Admission: AD | Admit: 2018-08-14 | Discharge: 2018-09-15 | DRG: 871 | Disposition: E | Payer: Medicare Other | Source: Other Acute Inpatient Hospital | Attending: Internal Medicine | Admitting: Internal Medicine

## 2018-08-14 ENCOUNTER — Emergency Department
Admission: EM | Admit: 2018-08-14 | Discharge: 2018-08-14 | Disposition: A | Discharge status: Other Acute Inpatient Hospital | Payer: Medicare Other | Attending: Emergency Medicine | Admitting: Emergency Medicine

## 2018-08-14 ENCOUNTER — Other Ambulatory Visit: Payer: Self-pay

## 2018-08-14 ENCOUNTER — Emergency Department: Payer: Medicare Other

## 2018-08-14 DIAGNOSIS — R001 Bradycardia, unspecified: Secondary | ICD-10-CM | POA: Diagnosis present

## 2018-08-14 DIAGNOSIS — Z887 Allergy status to serum and vaccine status: Secondary | ICD-10-CM

## 2018-08-14 DIAGNOSIS — R0602 Shortness of breath: Secondary | ICD-10-CM | POA: Diagnosis present

## 2018-08-14 DIAGNOSIS — U071 COVID-19: Secondary | ICD-10-CM | POA: Diagnosis present

## 2018-08-14 DIAGNOSIS — A4189 Other specified sepsis: Principal | ICD-10-CM | POA: Diagnosis present

## 2018-08-14 DIAGNOSIS — Z66 Do not resuscitate: Secondary | ICD-10-CM | POA: Diagnosis present

## 2018-08-14 DIAGNOSIS — I214 Non-ST elevation (NSTEMI) myocardial infarction: Secondary | ICD-10-CM | POA: Diagnosis not present

## 2018-08-14 DIAGNOSIS — L89891 Pressure ulcer of other site, stage 1: Secondary | ICD-10-CM | POA: Diagnosis present

## 2018-08-14 DIAGNOSIS — J1289 Other viral pneumonia: Secondary | ICD-10-CM | POA: Diagnosis present

## 2018-08-14 DIAGNOSIS — F0280 Dementia in other diseases classified elsewhere without behavioral disturbance: Secondary | ICD-10-CM | POA: Diagnosis present

## 2018-08-14 DIAGNOSIS — E860 Dehydration: Secondary | ICD-10-CM | POA: Diagnosis not present

## 2018-08-14 DIAGNOSIS — Z886 Allergy status to analgesic agent status: Secondary | ICD-10-CM

## 2018-08-14 DIAGNOSIS — L8990 Pressure ulcer of unspecified site, unspecified stage: Secondary | ICD-10-CM | POA: Diagnosis present

## 2018-08-14 DIAGNOSIS — L89311 Pressure ulcer of right buttock, stage 1: Secondary | ICD-10-CM | POA: Diagnosis present

## 2018-08-14 DIAGNOSIS — E611 Iron deficiency: Secondary | ICD-10-CM | POA: Diagnosis not present

## 2018-08-14 DIAGNOSIS — R4182 Altered mental status, unspecified: Secondary | ICD-10-CM | POA: Diagnosis present

## 2018-08-14 DIAGNOSIS — Z79899 Other long term (current) drug therapy: Secondary | ICD-10-CM

## 2018-08-14 DIAGNOSIS — E1165 Type 2 diabetes mellitus with hyperglycemia: Secondary | ICD-10-CM | POA: Diagnosis present

## 2018-08-14 DIAGNOSIS — N179 Acute kidney failure, unspecified: Secondary | ICD-10-CM | POA: Diagnosis present

## 2018-08-14 DIAGNOSIS — L89626 Pressure-induced deep tissue damage of left heel: Secondary | ICD-10-CM | POA: Diagnosis not present

## 2018-08-14 DIAGNOSIS — Z8542 Personal history of malignant neoplasm of other parts of uterus: Secondary | ICD-10-CM | POA: Insufficient documentation

## 2018-08-14 DIAGNOSIS — R6521 Severe sepsis with septic shock: Secondary | ICD-10-CM | POA: Diagnosis present

## 2018-08-14 DIAGNOSIS — J9601 Acute respiratory failure with hypoxia: Secondary | ICD-10-CM | POA: Diagnosis present

## 2018-08-14 DIAGNOSIS — G9340 Encephalopathy, unspecified: Secondary | ICD-10-CM | POA: Diagnosis not present

## 2018-08-14 DIAGNOSIS — G9341 Metabolic encephalopathy: Secondary | ICD-10-CM | POA: Diagnosis present

## 2018-08-14 DIAGNOSIS — F0390 Unspecified dementia without behavioral disturbance: Secondary | ICD-10-CM | POA: Diagnosis present

## 2018-08-14 DIAGNOSIS — Z882 Allergy status to sulfonamides status: Secondary | ICD-10-CM

## 2018-08-14 DIAGNOSIS — L89156 Pressure-induced deep tissue damage of sacral region: Secondary | ICD-10-CM | POA: Diagnosis present

## 2018-08-14 DIAGNOSIS — I10 Essential (primary) hypertension: Secondary | ICD-10-CM | POA: Insufficient documentation

## 2018-08-14 DIAGNOSIS — Z515 Encounter for palliative care: Secondary | ICD-10-CM | POA: Diagnosis present

## 2018-08-14 DIAGNOSIS — L89152 Pressure ulcer of sacral region, stage 2: Secondary | ICD-10-CM | POA: Diagnosis present

## 2018-08-14 DIAGNOSIS — R401 Stupor: Secondary | ICD-10-CM

## 2018-08-14 DIAGNOSIS — J069 Acute upper respiratory infection, unspecified: Secondary | ICD-10-CM

## 2018-08-14 DIAGNOSIS — Z90710 Acquired absence of both cervix and uterus: Secondary | ICD-10-CM

## 2018-08-14 DIAGNOSIS — E870 Hyperosmolality and hypernatremia: Secondary | ICD-10-CM | POA: Diagnosis present

## 2018-08-14 DIAGNOSIS — Z8249 Family history of ischemic heart disease and other diseases of the circulatory system: Secondary | ICD-10-CM

## 2018-08-14 DIAGNOSIS — L89321 Pressure ulcer of left buttock, stage 1: Secondary | ICD-10-CM | POA: Diagnosis not present

## 2018-08-14 DIAGNOSIS — E119 Type 2 diabetes mellitus without complications: Secondary | ICD-10-CM | POA: Insufficient documentation

## 2018-08-14 DIAGNOSIS — Z885 Allergy status to narcotic agent status: Secondary | ICD-10-CM

## 2018-08-14 DIAGNOSIS — M1990 Unspecified osteoarthritis, unspecified site: Secondary | ICD-10-CM | POA: Diagnosis present

## 2018-08-14 LAB — COMPREHENSIVE METABOLIC PANEL
ALT: 26 U/L (ref 0–44)
ALT: 23 U/L (ref 0–44)
AST: 41 U/L (ref 15–41)
AST: 30 U/L (ref 15–41)
Albumin: 2.5 g/dL — ABNORMAL LOW (ref 3.5–5.0)
Albumin: 2.5 g/dL — ABNORMAL LOW (ref 3.5–5.0)
Alkaline Phosphatase: 55 U/L (ref 38–126)
Alkaline Phosphatase: 57 U/L (ref 38–126)
Anion gap: 12 (ref 5–15)
Anion gap: 11 (ref 5–15)
BUN: 158 mg/dL — ABNORMAL HIGH (ref 8–23)
BUN: 116 mg/dL — ABNORMAL HIGH (ref 8–23)
CO2: 22 mmol/L (ref 22–32)
CO2: 24 mmol/L (ref 22–32)
Calcium: 9.3 mg/dL (ref 8.9–10.3)
Calcium: 9.7 mg/dL (ref 8.9–10.3)
Chloride: 126 mmol/L — ABNORMAL HIGH (ref 98–111)
Chloride: 125 mmol/L — ABNORMAL HIGH (ref 98–111)
Creatinine, Ser: 2.5 mg/dL — ABNORMAL HIGH (ref 0.44–1.00)
Creatinine, Ser: 2.2 mg/dL — ABNORMAL HIGH (ref 0.44–1.00)
GFR calc Af Amer: 20 mL/min — ABNORMAL LOW (ref >60)
GFR calc Af Amer: 23 mL/min — ABNORMAL LOW (ref >60)
GFR calc non Af Amer: 17 mL/min — ABNORMAL LOW (ref >60)
GFR calc non Af Amer: 20 mL/min — ABNORMAL LOW (ref >60)
Glucose, Bld: 238 mg/dL — ABNORMAL HIGH (ref 70–99)
Glucose, Bld: 238 mg/dL — ABNORMAL HIGH (ref 70–99)
Potassium: 3.9 mmol/L (ref 3.5–5.1)
Potassium: 3.8 mmol/L (ref 3.5–5.1)
Sodium: 160 mmol/L — ABNORMAL HIGH (ref 135–145)
Sodium: 160 mmol/L — ABNORMAL HIGH (ref 135–145)
Total Bilirubin: 1.4 mg/dL — ABNORMAL HIGH (ref 0.3–1.2)
Total Bilirubin: 0.5 mg/dL (ref 0.3–1.2)
Total Protein: 7 g/dL (ref 6.5–8.1)
Total Protein: 6.9 g/dL (ref 6.5–8.1)

## 2018-08-14 LAB — SARS CORONAVIRUS 2 BY RT PCR (HOSPITAL ORDER, PERFORMED IN ~~LOC~~ HOSPITAL LAB): SARS Coronavirus 2: POSITIVE — AB

## 2018-08-14 LAB — CBC WITH DIFFERENTIAL/PLATELET
Abs Immature Granulocytes: 0.06 10*3/uL (ref 0.00–0.07)
Basophils Absolute: 0 10*3/uL (ref 0.0–0.1)
Basophils Relative: 0 %
Eosinophils Absolute: 0 10*3/uL (ref 0.0–0.5)
Eosinophils Relative: 0 %
HCT: 43.9 % (ref 36.0–46.0)
Hemoglobin: 13.2 g/dL (ref 12.0–15.0)
Immature Granulocytes: 1 %
Lymphocytes Relative: 7 %
Lymphs Abs: 0.7 10*3/uL (ref 0.7–4.0)
MCH: 29.1 pg (ref 26.0–34.0)
MCHC: 30.1 g/dL (ref 30.0–36.0)
MCV: 96.7 fL (ref 80.0–100.0)
Monocytes Absolute: 0.3 10*3/uL (ref 0.1–1.0)
Monocytes Relative: 3 %
Neutro Abs: 9.3 10*3/uL — ABNORMAL HIGH (ref 1.7–7.7)
Neutrophils Relative %: 89 %
Platelets: 198 10*3/uL (ref 150–400)
RBC: 4.54 MIL/uL (ref 3.87–5.11)
RDW: 14.8 % (ref 11.5–15.5)
WBC: 10.4 10*3/uL (ref 4.0–10.5)
nRBC: 0 % (ref 0.0–0.2)

## 2018-08-14 LAB — FIBRIN DERIVATIVES D-DIMER (ARMC ONLY): Fibrin derivatives D-dimer (ARMC): 3887.09 ng/mL (FEU) — ABNORMAL HIGH (ref 0.00–499.00)

## 2018-08-14 LAB — LIPASE, BLOOD: Lipase: 36 U/L (ref 11–51)

## 2018-08-14 LAB — URINALYSIS, ROUTINE W REFLEX MICROSCOPIC
Bilirubin Urine: NEGATIVE
Glucose, UA: NEGATIVE mg/dL
Ketones, ur: NEGATIVE mg/dL
Leukocytes,Ua: NEGATIVE
Nitrite: NEGATIVE
Protein, ur: NEGATIVE mg/dL
Specific Gravity, Urine: 1.021 (ref 1.005–1.030)
Squamous Epithelial / HPF: NONE SEEN (ref 0–5)
pH: 5 (ref 5.0–8.0)

## 2018-08-14 LAB — PROTIME-INR
INR: 1.6 — ABNORMAL HIGH (ref 0.8–1.2)
Prothrombin Time: 18.7 seconds — ABNORMAL HIGH (ref 11.4–15.2)

## 2018-08-14 LAB — ABO/RH: ABO/RH(D): O POS

## 2018-08-14 LAB — LACTIC ACID, PLASMA
Lactic Acid, Venous: 3.8 mmol/L (ref 0.5–1.9)
Lactic Acid, Venous: 2.7 mmol/L (ref 0.5–1.9)

## 2018-08-14 LAB — C-REACTIVE PROTEIN: CRP: 6.1 mg/dL — ABNORMAL HIGH (ref <1.0)

## 2018-08-14 LAB — TROPONIN I: Troponin I: 0.24 ng/mL (ref <0.03)

## 2018-08-14 MED ORDER — ONDANSETRON HCL 4 MG PO TABS: 4.0000 mg | ORAL_TABLET | Freq: Four times a day (QID) | ORAL | Status: DC | PRN

## 2018-08-14 MED ORDER — HEPARIN SODIUM (PORCINE) 5000 UNIT/ML IJ SOLN
5000.0000 [IU] | Freq: Three times a day (TID) | INTRAMUSCULAR | Status: DC
  Administered 2018-08-14: 5000 [IU] via SUBCUTANEOUS
  Filled 2018-08-14 (×2): qty 1

## 2018-08-14 MED ORDER — HEPARIN (PORCINE) 25000 UT/250ML-% IV SOLN
650.0000 [IU]/h | INTRAVENOUS | Status: DC
  Administered 2018-08-14: 800 [IU]/h via INTRAVENOUS
  Administered 2018-08-16: 650 [IU]/h via INTRAVENOUS
  Filled 2018-08-14 (×2): qty 250

## 2018-08-14 MED ORDER — SODIUM CHLORIDE 0.9 % IV BOLUS
500.0000 mL | Freq: Once | INTRAVENOUS | Status: AC
  Administered 2018-08-14: 500 mL via INTRAVENOUS

## 2018-08-14 MED ORDER — SODIUM CHLORIDE 0.45 % IV SOLN
INTRAVENOUS | Status: DC
  Administered 2018-08-14: 13:00:00 via INTRAVENOUS

## 2018-08-14 MED ORDER — ONDANSETRON HCL 4 MG/2ML IJ SOLN: 4.0000 mg | Freq: Four times a day (QID) | INTRAMUSCULAR | Status: DC | PRN

## 2018-08-14 MED ORDER — ACETAMINOPHEN 325 MG PO TABS
650.0000 mg | ORAL_TABLET | Freq: Four times a day (QID) | ORAL | Status: DC | PRN
  Administered 2018-08-17: 650 mg via ORAL
  Filled 2018-08-14: qty 2

## 2018-08-14 MED ORDER — NITROGLYCERIN 2 % TD OINT
0.5000 [in_us] | TOPICAL_OINTMENT | Freq: Four times a day (QID) | TRANSDERMAL | Status: DC
  Administered 2018-08-14 – 2018-08-17 (×10): 0.5 [in_us] via TOPICAL
  Filled 2018-08-14: qty 30

## 2018-08-14 NOTE — H&P (Signed)
History and Physical   [name] WLN:[id number] DOB: 1931-08-21 DOA: 08/11/2018  Referring MD/NP/PA: Dr. Jacqualine Code, [place] PCP: Kirk Ruths, MD  Patient coming from: [place] by way of [facility] ED  Chief Complaint: Altered mental status  HPI: Melinda Hughes is an 85 y.o. female with a history of dementia, diet-controlled T2DM, HTN, and uterine cancer who presented to the ED from nursing home due to diminished responsiveness in the setting of reported covid-positive test. Due to dementia and diminished responsiveness we are uncertain of the duration and progress of symptoms. She was reportedly hypoxic and on NRB at time of presentation. This was weaned in the ED, though she was found to be in renal failure with hypernatremia and lactic acidosis. Covid positivity confirmed and admission to [facility] requested with IV fluids.   Review of Systems: Unable to completely obtain, and per HPI. All others reviewed and are negative.   Past Medical History:  Diagnosis Date   Arthritis    Dementia ([place])    Diabetes mellitus without complication ([place])    Hypertension    Uterine cancer ([place])    Past Surgical History:  Procedure Laterality Date   ABDOMINAL HYSTERECTOMY     EYE SURGERY     FRACTURE SURGERY     ORIF FEMUR FRACTURE Left 08/01/2016   Procedure: OPEN REDUCTION INTERNAL FIXATION (ORIF) DISTAL FEMUR FRACTURE;  Surgeon: Thornton Park, MD;  Location: [facility] ORS;  Service: Orthopedics;  Laterality: Left;   - Nonsmoker, no EtOH, lives at [facility]   Allergies  Allergen Reactions   Aspirin Other (See Comments)    GI intolerance   Codeine Itching   Fluzone [Flu Virus Vaccine]     Unknown reaction. Received info from nursing home   Morphine And Related Itching   Sulfa Antibiotics Itching   Tramadol Other (See Comments)    Constipation   Family History  Problem Relation Age of Onset   Hypertension Father    - Family history otherwise reviewed and not pertinent.    Prior to Admission medications   Medication Sig Start Date End Date Taking? Authorizing Provider  acetaminophen (TYLENOL) 325 MG tablet Take 2 tablets (650 mg total) by mouth every 6 (six) hours as needed for mild pain, fever or headache (or Fever >/= 100.5). 10/19/15   Gladstone Lighter, MD  albuterol (VENTOLIN HFA) 108 (90 Base) MCG/ACT inhaler Inhale 2 puffs into the lungs every 6 (six) hours as needed for wheezing or shortness of breath.    [provider]  bisacodyl (DULCOLAX) 10 MG suppository Place 1 suppository (10 mg total) rectally daily as needed for moderate constipation. 10/19/15   Gladstone Lighter, MD  calcium-vitamin D (OSCAL WITH D) 500-200 MG-UNIT tablet Take 1 tablet by mouth.    [provider]  Cholecalciferol (VITAMIN D3) 50 MCG (2000 UT) capsule Take 2,000 Units by mouth daily.    [provider]  docusate sodium (COLACE) 100 MG capsule Take 100 mg by mouth daily.    [provider]  enoxaparin (LOVENOX) 40 MG/0.4ML injection Inject 0.4 mLs (40 mg total) into the skin daily. Patient not taking: Reported on 08/05/2018 08/04/16   Fritzi Mandes, MD  ferrous sulfate 325 (65 FE) MG tablet Take 1 tablet (325 mg total) by mouth 3 (three) times daily after meals. 08/04/16   Fritzi Mandes, MD  hydrALAZINE (APRESOLINE) 25 MG tablet Take 1 tablet (25 mg total) by mouth every 8 (eight) hours. 08/04/16   Fritzi Mandes, MD  nystatin cream (  MYCOSTATIN) Apply 1 application topically 3 (three) times daily.    [provider]  polyethylene glycol (MIRALAX / GLYCOLAX) packet Take 17 g by mouth daily.    [provider]    Physical Exam: There were no vitals filed for this visit. Constitutional: Elderly female not responsive to voice, calm demeanor Eyes: Lids and conjunctivae normal, PERRL ENMT: Mucous membranes are dry. Posterior pharynx clear of any exudate or lesions. Poor dentition.  Neck: Normal, supple, no masses, no thyromegaly Respiratory:  Non-labored breathing supplemental oxygen without accessory muscle use. Clear breath sounds to auscultation bilaterally Cardiovascular: Regular rate and rhythm, no murmurs, rubs, or gallops. No carotid bruits. No JVD. No LE edema. Palpable pedal pulses. Abdomen: Normoactive bowel sounds. No tenderness, non-distended, and no masses palpated. No hepatosplenomegaly. Musculoskeletal: No clubbing / cyanosis. No joint deformity upper and lower extremities. Good ROM, no contractures. Normal muscle tone.  Skin: Warm, dry. Foam pad dressings on multiple surfaces, no erythema or exudate noted. No rashes, wounds, ulcers on visualized skin. Subcutaneous line in lower abdomen with D5-1/2NS 1L bag attached, dated 5/29. Neurologic: Not cooperative with exam. Spastic upper extremities.  Psychiatric: Holds eyes closed, does not respond verbally, moves when touched.   Labs on Admission: I have personally reviewed following labs and imaging studies  CBC: Recent Labs  Lab 07/18/2018 1102  WBC 10.4  NEUTROABS 9.3*  HGB 13.2  HCT 43.9  MCV 96.7  PLT 010   Basic Metabolic Panel: Recent Labs  Lab 08/12/2018 1102  NA 160*  K 3.9  CL 126*  CO2 22  GLUCOSE 238*  BUN 158*  CREATININE 2.50*  CALCIUM 9.3   GFR: CrCl cannot be calculated (Unknown ideal weight.). Liver Function Tests: Recent Labs  Lab 07/18/2018 1102  AST 41  ALT 26  ALKPHOS 55  BILITOT 1.4*  PROT 7.0  ALBUMIN 2.5*   Recent Labs  Lab 08/11/2018 1102  LIPASE 36   Coagulation Profile: Recent Labs  Lab 08/05/2018 1102  INR 1.6*   Urine analysis:    Component Value Date/Time   COLORURINE YELLOW (A) 08/01/2018 1145   APPEARANCEUR HAZY (A) 07/26/2018 1145   APPEARANCEUR Hazy 04/14/2013 1852   LABSPEC 1.021 08/08/2018 1145   LABSPEC 1.013 04/14/2013 1852   PHURINE 5.0 08/02/2018 1145   GLUCOSEU NEGATIVE 07/30/2018 1145   GLUCOSEU 50 mg/dL 04/14/2013 1852   HGBUR SMALL (A) 08/08/2018 1145   BILIRUBINUR NEGATIVE 08/07/2018 1145    BILIRUBINUR Negative 04/14/2013 1852   KETONESUR NEGATIVE 08/15/2018 1145   PROTEINUR NEGATIVE 07/25/2018 1145   NITRITE NEGATIVE 08/05/2018 1145   LEUKOCYTESUR NEGATIVE 07/24/2018 1145   LEUKOCYTESUR Negative 04/14/2013 1852    Recent Results (from the past 240 hour(s))  SARS Coronavirus 2 (CEPHEID- Performed in [place] hospital lab), Hosp Order     Status: Abnormal   Collection Time: 08/11/2018 11:02 AM  Result Value Ref Range Status   SARS Coronavirus 2 POSITIVE (A) NEGATIVE Final    Comment: RESULT CALLED TO, READ BACK BY AND VERIFIED WITH: HUNTER ORE AT 1229 07/19/2018.PMF (NOTE) If result is NEGATIVE SARS-CoV-2 target nucleic acids are NOT DETECTED. The SARS-CoV-2 RNA is generally detectable in upper and lower  respiratory specimens during the acute phase of infection. The lowest  concentration of SARS-CoV-2 viral copies this assay can detect is 250  copies / mL. A negative result does not preclude SARS-CoV-2 infection  and should not be used as the sole basis for treatment or other  patient management decisions.  A negative result may occur with  improper specimen collection / handling, submission of specimen other  than nasopharyngeal swab, presence of viral mutation(s) within the  areas targeted by this assay, and inadequate number of viral copies  (<250 copies / mL). A negative result must be combined with clinical  observations, patient history, and epidemiological information. If result is POSITIVE SARS-CoV-2 target nucleic acids are DETECTED. The  SARS-CoV-2 RNA is generally detectable in upper and lower  respiratory specimens during the acute phase of infection.  Positive  results are indicative of active infection with SARS-CoV-2.  Clinical  correlation with patient history and other diagnostic information is  necessary to determine patient infection status.  Positive results do  not rule out bacterial infection or co-infection with other viruses. If result is  PRESUMPTIVE POSTIVE SARS-CoV-2 nucleic acids MAY BE PRESENT.   A presumptive positive result was obtained on the submitted specimen  and confirmed on repeat testing.  While 2019 novel coronavirus  (SARS-CoV-2) nucleic acids may be present in the submitted sample  additional confirmatory testing may be necessary for epidemiological  and / or clinical management purposes  to differentiate between  SARS-CoV-2 and other Sarbecovirus currently known to infect humans.  If clinically indicated additional testing with an alternate test  methodology [phone]) is ad vised. The SARS-CoV-2 RNA is generally  detectable in upper and lower respiratory specimens during the acute  phase of infection. The expected result is Negative. Fact Sheet for Patients:  StrictlyIdeas.no Fact Sheet for Healthcare Providers: BankingDealers.co.za This test is not yet approved or cleared by the [place] FDA and has been authorized for detection and/or diagnosis of SARS-CoV-2 by FDA under an Emergency Use Authorization (EUA).  This EUA will remain in effect (meaning this test can be used) for the duration of the COVID-19 declaration under Section 564(b)(1) of the Act, 21 U.S.C. section 360bbb-3(b)(1), unless the authorization is terminated or revoked sooner. Performed at [facility], [address] [place], [place] [address]      Radiological Exams on Admission: Dg Chest Portable 1 View  Result Date: 08/13/2018 CLINICAL DATA:  Hypoxia in a patient who has tested positive for COVID-19. EXAM: PORTABLE CHEST 1 VIEW COMPARISON:  Single-view of the chest 07/31/2016 and 07/21/2016. PA and lateral chest 10/18/2015. FINDINGS: Single nodular opacities in the lower lobes bilaterally are stable in appearance since the 2018 exams. No consolidative process, pneumothorax or effusion. Heart size is normal. No acute bony abnormality. IMPRESSION: No acute disease. No  change in bilateral lower lobe nodular opacities since the 2018 studies. Electronically Signed   By: Inge Rise M.D.   On: 07/22/2018 11:51   Assessment/Plan Principal Problem:   Acute respiratory disease due to COVID-19 virus Active Problems:   Acute encephalopathy   Pressure injury of skin   Dementia ([place])   Hypertension   Acute renal failure (H[facility])   Covid-19 infection with acute hypoxic respiratory failure despite no infiltrates on CXR:   - Continue airborne, contact precautions. PPE including surgical gown, gloves, face shield, cap, shoe covers, and N-95 used during this encounter in a negative pressure room.  - Check inflammatory labs. - Check troponin.  - Maintain euvolemia/net negative.  - Avoid NSAIDs - Not currently able to advise proning and aggressive use of incentive spirometry - No current wheezing.  - Goals of care were discussed in ED. Prognosis is guarded.   Acute renal failure:  - Continue IVF's - Monitor I/O  Hypernatremia: Given NS bolus and  hypotonic saline thus far. Suspect dehydration in setting of encephalopathy, overall volume down on exam. - Recheck metabolic panel now and continue fluids as indicated.   HTN: Soft BP.  - Hold hydralazine  Iron deficiency: No anemia currently.  - Hold po iron for now  Acute metabolic encephalopathy on chronic dementia: Due to viral infection, hypernatremia, and renal failure.  - Delirium precautions  DVT prophylaxis: Heparin  Code Status: DNR  Family Communication: Called daughter, no answer.  Disposition Plan: Uncertain, guarded prognosis.  Consults called: None  Admission status: Inpatient    Patrecia Pour, MD [facility] [email or web address] Password [facility] 07/29/2018, 6:37 PM

## 2018-08-14 NOTE — ED Notes (Signed)
Called daughter and placed on speaker for daughter to talk to patient per daughter's request. Pt only responds to tactile stimuli.

## 2018-08-14 NOTE — ED Notes (Signed)
emtala reviewed by this RN 

## 2018-08-14 NOTE — Progress Notes (Signed)
ANTICOAGULATION CONSULT NOTE  Pharmacy Consult for Heparin Indication: r/o ACS and COVID-19  Allergies  Allergen Reactions  . Aspirin Other (See Comments)    GI intolerance  . Codeine Itching  . Fluzone [Flu Virus Vaccine]     Unknown reaction. Received info from nursing home  . Morphine And Related Itching  . Sulfa Antibiotics Itching  . Tramadol Other (See Comments)    Constipation    Patient Measurements:   Heparin Dosing Weight: 66 kg  Vital Signs: Temp: 98.4 F (36.9 C) (05/30 1959) Temp Source: Axillary (05/30 1959) BP: 141/91 (05/30 1959) Pulse Rate: 73 (05/30 1959)  Labs: Recent Labs    07/30/2018 1102 07/28/2018 1825  HGB 13.2  --   HCT 43.9  --   PLT 198  --   LABPROT 18.7*  --   INR 1.6*  --   CREATININE 2.50* 2.20*  TROPONINI  --  0.24*    CrCl cannot be calculated (Unknown ideal weight.).   Medical History: Past Medical History:  Diagnosis Date  . Arthritis   . Dementia ([place])   . Diabetes mellitus without complication ([place])   . Hypertension   . Uterine cancer [facility])     Assessment: 85 y/o F from LTCF with COVID outbreak admitted with AMS. Patient has a h/o DM, HTN, dementia, and uterine cancer. Pharmay consulted to manage heparin infusion for elevated troponin and hypercoagulable state associated with COVID-19.   Goal of Therapy:  Heparin level 0.3-0.7 units/ml Monitor platelets by anticoagulation protocol: Yes   Plan:  Start heparin infusion at 800 units/hr without bolus due to recent SQH, INR=1.6, and ARF Check anti-Xa level in 8 hours and daily while on heparin Continue to monitor H&H and platelets  Ulice Dash D 07/29/2018,9:12 PM

## 2018-08-14 NOTE — ED Notes (Signed)
[facility] at bedside 

## 2018-08-14 NOTE — ED Provider Notes (Signed)
[facility] Emergency Department Provider Note ____________________________________________   First MD Initiated Contact with Patient 08/13/2018 1052     (approximate)  I have reviewed the triage vital signs and the nursing notes.   HISTORY  Chief Complaint Shortness of Breath  EM caveat, history obtained from EMS and also discussion with the patient's power of attorney and daughter  HPI Melinda Hughes is a 85 y.o. female here for evaluation for unresponsiveness and increasing oxygen needs.  The patient was diagnosed yesterday with COVID-19.   During the day the patient's oxygen saturations have fallen to as low as the 70% range while on nasal cannula per EMS, they were called for unresponsiveness, patient placed on nonrebreather and brought to the ER for further evaluation.    Past Medical History:  Diagnosis Date  . Arthritis   . Dementia ([place])   . Diabetes mellitus without complication ([place])   . Hypertension   . Uterine cancer [facility])     Patient Active Problem List   Diagnosis Date Noted  . Closed left subtrochanteric femur fracture, initial encounter ([place]) 07/31/2016  . Sepsis ([place]) 05/15/2016  . Pressure ulcer 10/18/2015  . Acute encephalopathy 10/17/2015    Past Surgical History:  Procedure Laterality Date  . ABDOMINAL HYSTERECTOMY    . EYE SURGERY    . FRACTURE SURGERY    . ORIF FEMUR FRACTURE Left 08/01/2016   Procedure: OPEN REDUCTION INTERNAL FIXATION (ORIF) DISTAL FEMUR FRACTURE;  Surgeon: Thornton Park, MD;  Location: [facility] ORS;  Service: Orthopedics;  Laterality: Left;    Prior to Admission medications   Medication Sig Start Date End Date Taking? Authorizing Provider  docusate sodium (COLACE) 100 MG capsule Take 100 mg by mouth daily.   Yes [provider]  ferrous sulfate 325 (65 FE) MG tablet Take 1 tablet (325 mg total) by mouth 3 (three) times daily after meals. 08/04/16  Yes Fritzi Mandes, MD  hydrALAZINE (APRESOLINE)  25 MG tablet Take 1 tablet (25 mg total) by mouth every 8 (eight) hours. 08/04/16  Yes Fritzi Mandes, MD  nystatin cream (MYCOSTATIN) Apply 1 application topically 3 (three) times daily.   Yes [provider]  acetaminophen (TYLENOL) 325 MG tablet Take 2 tablets (650 mg total) by mouth every 6 (six) hours as needed for mild pain, fever or headache (or Fever >/= 100.5). 10/19/15   Gladstone Lighter, MD  bisacodyl (DULCOLAX) 10 MG suppository Place 1 suppository (10 mg total) rectally daily as needed for moderate constipation. 10/19/15   Gladstone Lighter, MD  enoxaparin (LOVENOX) 40 MG/0.4ML injection Inject 0.4 mLs (40 mg total) into the skin daily. Patient not taking: Reported on 07/19/2018 08/04/16   Fritzi Mandes, MD  polyethylene glycol [facility] Floria Raveling) packet Take 17 g by mouth daily.    [provider]    Allergies Aspirin; Codeine; Fluzone [flu virus vaccine]; Morphine and related; Sulfa antibiotics; and Tramadol  Family History  Problem Relation Age of Onset  . Hypertension Father     Social History Social History   Tobacco Use  . Smoking status: Never Smoker  . Smokeless tobacco: Never Used  Substance Use Topics  . Alcohol use: No  . Drug use: No    Review of Systems EM caveat  ____________________________________________   PHYSICAL EXAM:  VITAL SIGNS: ED Triage Vitals  Enc Vitals Group     BP 07/25/2018 1058 (!) 115/57     Pulse Rate 07/20/2018 1130 69     Resp 07/23/2018 1058 (!)  26     Temp --      Temp src --      SpO2 07/28/2018 1058 91 %     Weight 08/04/2018 1106 150 lb (68 kg)     Height --      Head Circumference --      Peak Flow --      Pain Score --      Pain Loc --      Pain Edu? --      Excl. in [place]? --     Constitutional: Unresponsive.  Responds just slightly to 2 tactile stimuli, withdraws slightly to pain in all extremities and wince just barely.  For the most part the patient is seems extremely obtunded Eyes: Conjunctivae are  normal. Head: Atraumatic. Nose: No congestion/rhinnorhea. Mouth/Throat: Mucous membranes are dry. Neck: No stridor.  Cardiovascular: Normal rate, regular rhythm. Grossly normal heart sounds.  Good peripheral circulation. Respiratory: Slight tachypneic, lung sounds fairly clear with some just slight central rhonchi.  Does not appear to be in acute respiratory extremis but just slightly tachypneic Gastrointestinal: Soft and nontender. No distention. Musculoskeletal: No lower extremity tenderness nor edema. Neurologic: Unresponsive.  Does not follow commands.  Does not arouse to voice.  Does not have any ability to converse and is a phasic Skin:  Skin is warm, dry and intact. No rash noted. Psychiatric: Cannot be assessed  ____________________________________________   LABS (all labs ordered are listed, but only abnormal results are displayed)  Labs Reviewed  SARS CORONAVIRUS 2 ([place] [place] [facility]) - Abnormal; Notable for the following components:      Result Value   SARS Coronavirus 2 POSITIVE (*)    All other components within normal limits  LACTIC ACID, PLASMA - Abnormal; Notable for the following components:   Lactic Acid, Venous 3.8 (*)    All other components within normal limits  COMPREHENSIVE METABOLIC PANEL - Abnormal; Notable for the following components:   Sodium 160 (*)    Chloride 126 (*)    Glucose, Bld 238 (*)    BUN 158 (*)    Creatinine, Ser 2.50 (*)    Albumin 2.5 (*)    Total Bilirubin 1.4 (*)    GFR calc non Af Amer 17 (*)    GFR calc Af Amer 20 (*)    All other components within normal limits  CBC WITH DIFFERENTIAL/PLATELET - Abnormal; Notable for the following components:   Neutro Abs 9.3 (*)    All other components within normal limits  PROTIME-INR - Abnormal; Notable for the following components:   Prothrombin Time 18.7 (*)    INR 1.6 (*)    All other components within normal limits  URINALYSIS, ROUTINE W REFLEX  MICROSCOPIC - Abnormal; Notable for the following components:   Color, Urine YELLOW (*)    APPearance HAZY (*)    Hgb urine dipstick SMALL (*)    Bacteria, UA MANY (*)    All other components within normal limits  CULTURE, BLOOD (ROUTINE X 2)  CULTURE, BLOOD (ROUTINE X 2)  LIPASE, BLOOD  LACTIC ACID, PLASMA   ____________________________________________  EKG   ____________________________________________  RADIOLOGY  Dg Chest Portable 1 View  Result Date: 07/18/2018 CLINICAL DATA:  Hypoxia in a patient who has tested positive for COVID-19. EXAM: PORTABLE CHEST 1 VIEW COMPARISON:  Single-view of the chest 07/31/2016 and 07/21/2016. PA and lateral chest 10/18/2015. FINDINGS: Single nodular opacities in the lower lobes bilaterally are stable in appearance  since the 2018 exams. No consolidative process, pneumothorax or effusion. Heart size is normal. No acute bony abnormality. IMPRESSION: No acute disease. No change in bilateral lower lobe nodular opacities since the 2018 studies. Electronically Signed   By: Inge Rise M.D.   On: 08/08/2018 11:51     ____________________________________________   PROCEDURES  Procedure(s) performed: None  Procedures  Critical Care performed: Yes, see critical care note(s)  CRITICAL CARE Performed by: Delman Kitten   Total critical care time: 37 minutes  Critical care time was exclusive of separately billable procedures and treating other patients.  Critical care was necessary to treat or prevent imminent or life-threatening deterioration.  Critical care was time spent personally by me on the following activities: development of treatment plan with patient and/or surrogate as well as nursing, discussions with consultants, evaluation of patient's response to treatment, examination of patient, obtaining history from patient or surrogate, ordering and performing treatments and interventions, ordering and review of laboratory studies, ordering  and review of radiographic studies, pulse oximetry and re-evaluation of patient's condition.  Patient unresponsive.  Critical hypernatremia.  COVID-19.  Requiring transfer.  Hypoxia requiring 1 her percent nonrebreather then taper down to about 4 L nasal cannula.  Patient's mental status, clinical history necessitate emergent evaluation for stabilizing as well as need to discuss with family including the patient's daughter who is power of attorney patient's CODE STATUS and condition. ____________________________________________   INITIAL IMPRESSION / ASSESSMENT AND PLAN / ED COURSE  Pertinent labs & imaging results that were available during my care of the patient were reviewed by me and considered in my medical decision making (see chart for details).     Clinical Course as of Aug 13 1232  Sat Aug 14, 2018  1100 Spoke with the patient's daughter who also is listed as healthcare power of attorney and she affirms this.  She asked for some time to speak with the patient's father is we determine steps in care and whether or not the patient would want to be on a ventilator and full scope of treatment.  At this time we will withhold intubation as the family is making a decision as to further level of care and considering potentially de-escalation of care in the setting of this critical illness   [MQ]    Clinical Course User Index [MQ] Delman Kitten, MD   Discussed this case with the patient's daughter [name] who is listed as her power of attorney on paperwork from [facility] as well as presents herself as power of attorney.  After having discussion with the patient's husband decision is made and relates frequent to make her mother DO NOT RESUSCITATE with a focus on making sure that she is comfortable given the gravity of her prognosis having COVID-19, oxygen requirement, and unresponsiveness.  I am in agreement with this.  No heroic measures will be performed, we will continue to support and treat  the patient but without invasive procedures.  No intubation.  No CPR.  Patient and family very understanding of the critical nature of this patient and agreeable with plan for transfer to [facility].  ----------------------------------------- 12:39 PM on 07/28/2018 -----------------------------------------  Patient remains no change in status   ----------------------------------------- 3:32 PM on 07/26/2018 -----------------------------------------  Patient remains unresponsive, [facility] team arriving shortly to pick her up.  She is clearly in critical condition, DO NOT RESUSCITATE form completed.  Map remains greater than 65, saturation 100% on nasal cannula.  Patient appears appropriate for transfer for  ongoing care for treatment of COVID-19.  ____________________________________________   FINAL CLINICAL IMPRESSION(S) / ED DIAGNOSES  Final diagnoses:  [id number] virus infection  Hypernatremia  AKI (acute kidney injury) ([place])  Obtundation        Note:  This document was prepared using Dragon voice recognition software and may include unintentional dictation errors       Delman Kitten, MD 07/20/2018 1533

## 2018-08-14 NOTE — Progress Notes (Signed)
Spoke with patient's daughter [name] and updated her on patient's status. Updated her as to plan of care and expectations regarding communication and further updates.

## 2018-08-14 NOTE — Progress Notes (Signed)
CRITICAL VALUE ALERT  Critical Value:  0.24 Date & Time Notied:  07/22/2018 1950  Provider Notified:S. [place] Pink, MD  Orders Received/Actions taken:

## 2018-08-14 NOTE — ED Triage Notes (Signed)
Pt presents via EMS from [facility] with hypoxia per EMS. [phone] per report. Pt responsive only to painful stimuli. Full code per EMS. Quale MD to speak with family.

## 2018-08-15 ENCOUNTER — Encounter (HOSPITAL_COMMUNITY): Payer: Self-pay | Admitting: Family Medicine

## 2018-08-15 ENCOUNTER — Other Ambulatory Visit: Payer: Self-pay

## 2018-08-15 DIAGNOSIS — I10 Essential (primary) hypertension: Secondary | ICD-10-CM

## 2018-08-15 LAB — COMPREHENSIVE METABOLIC PANEL
ALT: 23 U/L (ref 0–44)
AST: 28 U/L (ref 15–41)
Albumin: 2.5 g/dL — ABNORMAL LOW (ref 3.5–5.0)
Alkaline Phosphatase: 60 U/L (ref 38–126)
Anion gap: 10 (ref 5–15)
BUN: 117 mg/dL — ABNORMAL HIGH (ref 8–23)
CO2: 22 mmol/L (ref 22–32)
Calcium: 9.6 mg/dL (ref 8.9–10.3)
Chloride: 127 mmol/L — ABNORMAL HIGH (ref 98–111)
Creatinine, Ser: 2.07 mg/dL — ABNORMAL HIGH (ref 0.44–1.00)
GFR calc Af Amer: 25 mL/min — ABNORMAL LOW (ref >60)
GFR calc non Af Amer: 21 mL/min — ABNORMAL LOW (ref >60)
Glucose, Bld: 228 mg/dL — ABNORMAL HIGH (ref 70–99)
Potassium: 3.2 mmol/L — ABNORMAL LOW (ref 3.5–5.1)
Sodium: 159 mmol/L — ABNORMAL HIGH (ref 135–145)
Total Bilirubin: 0.5 mg/dL (ref 0.3–1.2)
Total Protein: 6.8 g/dL (ref 6.5–8.1)

## 2018-08-15 LAB — CBC WITH DIFFERENTIAL/PLATELET
Abs Immature Granulocytes: 0.1 10*3/uL — ABNORMAL HIGH (ref 0.00–0.07)
Basophils Absolute: 0 10*3/uL (ref 0.0–0.1)
Basophils Relative: 0 %
Eosinophils Absolute: 0 10*3/uL (ref 0.0–0.5)
Eosinophils Relative: 0 %
HCT: 42.6 % (ref 36.0–46.0)
Hemoglobin: 13.1 g/dL (ref 12.0–15.0)
Immature Granulocytes: 1 %
Lymphocytes Relative: 7 %
Lymphs Abs: 0.6 10*3/uL — ABNORMAL LOW (ref 0.7–4.0)
MCH: 29.4 pg (ref 26.0–34.0)
MCHC: 30.8 g/dL (ref 30.0–36.0)
MCV: 95.7 fL (ref 80.0–100.0)
Monocytes Absolute: 0.3 10*3/uL (ref 0.1–1.0)
Monocytes Relative: 3 %
Neutro Abs: 8.2 10*3/uL — ABNORMAL HIGH (ref 1.7–7.7)
Neutrophils Relative %: 89 %
Platelets: 211 10*3/uL (ref 150–400)
RBC: 4.45 MIL/uL (ref 3.87–5.11)
RDW: 14.7 % (ref 11.5–15.5)
WBC: 9.2 10*3/uL (ref 4.0–10.5)
nRBC: 0.2 % (ref 0.0–0.2)

## 2018-08-15 LAB — HEPARIN LEVEL (UNFRACTIONATED): Heparin Unfractionated: 0.95 IU/mL — ABNORMAL HIGH (ref 0.30–0.70)

## 2018-08-15 LAB — BASIC METABOLIC PANEL
Anion gap: 9 (ref 5–15)
BUN: 147 mg/dL — ABNORMAL HIGH (ref 8–23)
CO2: 23 mmol/L (ref 22–32)
Calcium: 9.5 mg/dL (ref 8.9–10.3)
Chloride: 128 mmol/L — ABNORMAL HIGH (ref 98–111)
Creatinine, Ser: 1.87 mg/dL — ABNORMAL HIGH (ref 0.44–1.00)
GFR calc Af Amer: 28 mL/min — ABNORMAL LOW (ref >60)
GFR calc non Af Amer: 24 mL/min — ABNORMAL LOW (ref >60)
Glucose, Bld: 222 mg/dL — ABNORMAL HIGH (ref 70–99)
Potassium: 3.6 mmol/L (ref 3.5–5.1)
Sodium: 160 mmol/L — ABNORMAL HIGH (ref 135–145)

## 2018-08-15 LAB — GLUCOSE, CAPILLARY
Glucose-Capillary: 156 mg/dL — ABNORMAL HIGH (ref 70–99)
Glucose-Capillary: 155 mg/dL — ABNORMAL HIGH (ref 70–99)

## 2018-08-15 LAB — TROPONIN I
Troponin I: 0.2 ng/mL (ref <0.03)
Troponin I: 0.24 ng/mL (ref <0.03)

## 2018-08-15 LAB — D-DIMER, QUANTITATIVE: D-Dimer, Quant: 3.17 ug/mL-FEU — ABNORMAL HIGH (ref 0.00–0.50)

## 2018-08-15 LAB — MAGNESIUM: Magnesium: 2.7 mg/dL — ABNORMAL HIGH (ref 1.7–2.4)

## 2018-08-15 LAB — C-REACTIVE PROTEIN: CRP: 12.5 mg/dL — ABNORMAL HIGH (ref <1.0)

## 2018-08-15 LAB — FERRITIN: Ferritin: 1411 ng/mL — ABNORMAL HIGH (ref 11–307)

## 2018-08-15 LAB — APTT: aPTT: 56 seconds — ABNORMAL HIGH (ref 24–36)

## 2018-08-15 LAB — PHOSPHORUS: Phosphorus: 2.6 mg/dL (ref 2.5–4.6)

## 2018-08-15 MED ORDER — POTASSIUM CHLORIDE IN NACL 20-0.45 MEQ/L-% IV SOLN
INTRAVENOUS | Status: DC
  Administered 2018-08-15 – 2018-08-16 (×2): via INTRAVENOUS
  Filled 2018-08-15 (×3): qty 1000

## 2018-08-15 MED ORDER — INSULIN ASPART 100 UNIT/ML ~~LOC~~ SOLN
0.0000 [IU] | Freq: Every day | SUBCUTANEOUS | Status: DC
  Administered 2018-08-16: 2 [IU] via SUBCUTANEOUS

## 2018-08-15 MED ORDER — INSULIN ASPART 100 UNIT/ML ~~LOC~~ SOLN
0.0000 [IU] | Freq: Three times a day (TID) | SUBCUTANEOUS | Status: DC
  Administered 2018-08-15 – 2018-08-16 (×3): 2 [IU] via SUBCUTANEOUS
  Administered 2018-08-16: 1 [IU] via SUBCUTANEOUS
  Administered 2018-08-17: 7 [IU] via SUBCUTANEOUS

## 2018-08-15 NOTE — Progress Notes (Signed)
PROGRESS NOTE  Melinda Hughes  PIR:[id number] DOB: Jun 06, 1931 DOA: 07/23/2018 PCP: Kirk Ruths, MD   Brief Narrative: Melinda Hughes is an 85 y.o. female with a history of dementia, diet-controlled T2DM, HTN, and uterine cancer who presented to the ED from her nursing home where she had recently tested positive for covid due to diminished responsiveness. She was reportedly hypoxic and on NRB at time of presentation. This was weaned in the ED, though she was found to be in renal failure with hypernatremia and lactic acidosis. Covid positivity confirmed and admission to [facility] requested with IV fluids. Renal function has improved slowly, though sodium remains elevated, and encephalopathy has persisted. Inflammatory markers have risen.   Assessment & Plan: Principal Problem:   Acute respiratory disease due to COVID-19 virus Active Problems:   Acute encephalopathy   Pressure injury of skin   Dementia (HCC)   Hypertension   Acute renal failure (HCC)  Covid-19 infection with acute hypoxic respiratory failure despite no infiltrates on CXR:   - Continue airborne, contact precautions. PPE including surgical gown, gloves, face shield, cap, shoe covers, and N-95 used during this encounter in a negative pressure room.  - Trend inflammatory labs. Unable to reach family for consent to investigational therapies.  - Maintain euvolemia/net negative.  - Avoid NSAIDs - Not currently able to prone or use incentive spirometry - No current wheezing.  - Goals of care were discussed in ED, will continue discussions with family when they are able to be reached. Prognosis remains guarded.   Acute renal failure:  - Continue IVF's - Monitor I/O  Hypernatremia: Given NS bolus and hypotonic saline thus far. Suspect dehydration in setting of encephalopathy, overall volume down on exam. - Continue hypotonic saline. follow metabolic panel.  Hyperglycemia, T2DM:  - Start SSI AC/HS. If no po intake, can convert to  q4h.  Elevated troponin: Suspect due to demand ischemia in setting of covid-19 and renal failure.  - Check ECG - Started heparin gtt - Has allergy to aspirin and morphine  HTN: Soft BP.  - Hold hydralazine  Iron deficiency: No anemia currently.  - Hold po iron for now  Acute metabolic encephalopathy on chronic dementia: Due to viral infection, hypernatremia, and renal failure.  - Delirium precautions  Multiple Pressure Injuries: All POA, listed below. Plan to offload as able. Pressure Ulcer 10/17/15 Stage II -  Partial thickness loss of dermis presenting as a shallow open ulcer with a red, pink wound bed without slough. red, open, .5 x .5 inch (Active)  10/17/15 2000  Location: Coccyx  Location Orientation: Mid  Staging: Stage II -  Partial thickness loss of dermis presenting as a shallow open ulcer with a red, pink wound bed without slough.  Wound Description (Comments): red, open, .5 x .5 inch  Present on Admission: Yes     Pressure Ulcer 10/17/15 Stage I -  Intact skin with non-blanchable redness of a localized area usually over a bony prominence. closed, dry  (Active)  10/17/15 2000  Location: Buttocks  Location Orientation: Left  Staging: Stage I -  Intact skin with non-blanchable redness of a localized area usually over a bony prominence.  Wound Description (Comments): closed, dry   Present on Admission: Yes     Pressure Injury 08/13/2018 Buttocks Right Stage I -  Intact skin with non-blanchable redness of a localized area usually over a bony prominence. (Active)  08/08/2018 1744  Location: Buttocks  Location Orientation: Right  Staging: Stage I -  Intact  skin with non-blanchable redness of a localized area usually over a bony prominence.  Wound Description (Comments):   Present on Admission: Yes     Pressure Injury 08/15/2018 Ischial tuberosity Right Stage I -  Intact skin with non-blanchable redness of a localized area usually over a bony prominence. (Active)  07/16/2018  1745  Location: Ischial tuberosity  Location Orientation: Right  Staging: Stage I -  Intact skin with non-blanchable redness of a localized area usually over a bony prominence.  Wound Description (Comments):   Present on Admission: Yes     Pressure Injury 07/17/2018 Sacrum Medial Deep Tissue Injury - Purple or maroon localized area of discolored intact skin or blood-filled blister due to damage of underlying soft tissue from pressure and/or shear. (Active)  07/25/2018 1746  Location: Sacrum  Location Orientation: Medial  Staging: Deep Tissue Injury - Purple or maroon localized area of discolored intact skin or blood-filled blister due to damage of underlying soft tissue from pressure and/or shear.  Wound Description (Comments):   Present on Admission: Yes     Pressure Injury 07/28/2018 Foot Right;Lateral Deep Tissue Injury - Purple or maroon localized area of discolored intact skin or blood-filled blister due to damage of underlying soft tissue from pressure and/or shear. (Active)  07/19/2018 1747  Location: Foot  Location Orientation: Right;Lateral  Staging: Deep Tissue Injury - Purple or maroon localized area of discolored intact skin or blood-filled blister due to damage of underlying soft tissue from pressure and/or shear.  Wound Description (Comments):   Present on Admission: Yes     Pressure Injury 07/27/2018 Heel Left Deep Tissue Injury - Purple or maroon localized area of discolored intact skin or blood-filled blister due to damage of underlying soft tissue from pressure and/or shear. blister like pressure ulcer (Active)  07/16/2018 1748  Location: Heel  Location Orientation: Left  Staging: Deep Tissue Injury - Purple or maroon localized area of discolored intact skin or blood-filled blister due to damage of underlying soft tissue from pressure and/or shear.  Wound Description (Comments): blister like pressure ulcer  Present on Admission: Yes   DVT prophylaxis: Heparin gtt Code Status:  DNR Family Communication: None at bedside, called daughter's home and cell phones without answer. Disposition Plan: Uncertain, guarded prognosis.  Consultants:   None  Procedures:   None  Antimicrobials:  None   Subjective: Nonverbal, minimally responsive this morning. Ate a couple bites of lunch.   Objective: Vitals:   08/08/2018 1959 08/15/18 0042 08/15/18 0544 08/15/18 0800  BP: (!) 141/91 133/71 (!) 145/82 (!) 110/49  Pulse: 73 75 71 65  Resp: 19 (!) 29 (!) 31 (!) 23  Temp: 98.4 F (36.9 C) 98.4 F (36.9 C) (!) 97.4 F (36.3 C) 98 F (36.7 C)  TempSrc: Axillary  Axillary   SpO2: 97% 95% 97% 96%    Intake/Output Summary (Last 24 hours) at 08/15/2018 1251 Last data filed at 08/15/2018 0500 Gross per 24 hour  Intake 40.36 ml  Output 600 ml  Net -559.64 ml   There were no vitals filed for this visit.  Gen: Elderly female in no acute distress. Pulm: Non-labored breathing. Clear to auscultation bilaterally.  CV: Regular rate and rhythm. No murmur, rub, or gallop. No JVD, no pedal edema. GI: Abdomen soft, non-tender, non-distended, with normoactive bowel sounds. No organomegaly or masses felt. Ext: Warm, no deformities Skin: Foam dressings in place, no new ulcers Neuro: Not oriented. Not cooperative with exam Psych: UTD  Data Reviewed: I have personally reviewed  following labs and imaging studies  CBC: Recent Labs  Lab 08/10/2018 1102 08/15/18 0012  WBC 10.4 9.2  NEUTROABS 9.3* 8.2*  HGB 13.2 13.1  HCT 43.9 42.6  MCV 96.7 95.7  PLT 198 976   Basic Metabolic Panel: Recent Labs  Lab 07/23/2018 1102 08/09/2018 1825 08/15/18 0012  NA 160* 160* 159*  K 3.9 3.8 3.2*  CL 126* 125* 127*  CO2 22 24 22   GLUCOSE 238* 238* 228*  BUN 158* 116* 117*  CREATININE 2.50* 2.20* 2.07*  CALCIUM 9.3 9.7 9.6  MG  --   --  2.7*  PHOS  --   --  2.6   GFR: CrCl cannot be calculated (Unknown ideal weight.). Liver Function Tests: Recent Labs  Lab 08/08/2018 1102 07/17/2018  1825 08/15/18 0012  AST 41 30 28  ALT 26 23 23   ALKPHOS 55 57 60  BILITOT 1.4* 0.5 0.5  PROT 7.0 6.9 6.8  ALBUMIN 2.5* 2.5* 2.5*   Recent Labs  Lab 07/20/2018 1102  LIPASE 36   No results for input(s): AMMONIA in the last 168 hours. Coagulation Profile: Recent Labs  Lab 07/19/2018 1102  INR 1.6*   Cardiac Enzymes: Recent Labs  Lab 08/04/2018 1825 08/15/18 0012 08/15/18 0700  TROPONINI 0.24* 0.20* 0.24*   BNP (last 3 results) No results for input(s): PROBNP in the last 8760 hours. HbA1C: No results for input(s): HGBA1C in the last 72 hours. CBG: No results for input(s): GLUCAP in the last 168 hours. Lipid Profile: No results for input(s): CHOL, HDL, LDLCALC, TRIG, CHOLHDL, LDLDIRECT in the last 72 hours. Thyroid Function Tests: No results for input(s): TSH, T4TOTAL, FREET4, T3FREE, THYROIDAB in the last 72 hours. Anemia Panel: Recent Labs    08/15/18 0012  FERRITIN 1,411*   Urine analysis:    Component Value Date/Time   COLORURINE YELLOW (A) 07/20/2018 1145   APPEARANCEUR HAZY (A) 08/03/2018 1145   APPEARANCEUR Hazy 04/14/2013 1852   LABSPEC 1.021 07/20/2018 1145   LABSPEC 1.013 04/14/2013 1852   PHURINE 5.0 07/24/2018 1145   GLUCOSEU NEGATIVE 08/11/2018 1145   GLUCOSEU 50 mg/dL 04/14/2013 1852   HGBUR SMALL (A) 08/08/2018 1145   BILIRUBINUR NEGATIVE 08/09/2018 1145   BILIRUBINUR Negative 04/14/2013 1852   KETONESUR NEGATIVE 08/15/2018 1145   PROTEINUR NEGATIVE 07/19/2018 1145   NITRITE NEGATIVE 08/11/2018 1145   LEUKOCYTESUR NEGATIVE 07/29/2018 1145   LEUKOCYTESUR Negative 04/14/2013 1852   Recent Results (from the past 240 hour(s))  SARS Coronavirus 2 (CEPHEID- Performed in [place] hospital lab), Hosp Order     Status: Abnormal   Collection Time: 08/03/2018 11:02 AM  Result Value Ref Range Status   SARS Coronavirus 2 POSITIVE (A) NEGATIVE Final    Comment: RESULT CALLED TO, READ BACK BY AND VERIFIED WITH: HUNTER ORE AT 1229 08/08/2018.PMF (NOTE)  If result is NEGATIVE SARS-CoV-2 target nucleic acids are NOT DETECTED. The SARS-CoV-2 RNA is generally detectable in upper and lower  respiratory specimens during the acute phase of infection. The lowest  concentration of SARS-CoV-2 viral copies this assay can detect is 250  copies / mL. A negative result does not preclude SARS-CoV-2 infection  and should not be used as the sole basis for treatment or other  patient management decisions.  A negative result may occur with  improper specimen collection / handling, submission of specimen other  than nasopharyngeal swab, presence of viral mutation(s) within the  areas targeted by this assay, and inadequate number of viral copies  (<250 copies /  mL). A negative result must be combined with clinical  observations, patient history, and epidemiological information. If result is POSITIVE SARS-CoV-2 target nucleic acids are DETECTED. The  SARS-CoV-2 RNA is generally detectable in upper and lower  respiratory specimens during the acute phase of infection.  Positive  results are indicative of active infection with SARS-CoV-2.  Clinical  correlation with patient history and other diagnostic information is  necessary to determine patient infection status.  Positive results do  not rule out bacterial infection or co-infection with other viruses. If result is PRESUMPTIVE POSTIVE SARS-CoV-2 nucleic acids MAY BE PRESENT.   A presumptive positive result was obtained on the submitted specimen  and confirmed on repeat testing.  While 2019 novel coronavirus  (SARS-CoV-2) nucleic acids may be present in the submitted sample  additional confirmatory testing may be necessary for epidemiological  and / or clinical management purposes  to differentiate between  SARS-CoV-2 and other Sarbecovirus currently known to infect humans.  If clinically indicated additional testing with an alternate test  methodology [id number]) is ad vised. The SARS-CoV-2 RNA is  generally  detectable in upper and lower respiratory specimens during the acute  phase of infection. The expected result is Negative. Fact Sheet for Patients:  StrictlyIdeas.no Fact Sheet for Healthcare Providers: BankingDealers.co.za This test is not yet approved or cleared by the [place] FDA and has been authorized for detection and/or diagnosis of SARS-CoV-2 by FDA under an Emergency Use Authorization (EUA).  This EUA will remain in effect (meaning this test can be used) for the duration of the COVID-19 declaration under Section 564(b)(1) of the Act, 21 U.S.C. section 360bbb-3(b)(1), unless the authorization is terminated or revoked sooner. Performed at [facility], [address] [place], [place] [address]   Blood Culture (routine x 2)     Status: None (Preliminary result)   Collection Time: 08/01/2018 11:45 AM  Result Value Ref Range Status   Specimen Description BLOOD R FA  Final   Special Requests   Final    BOTTLES DRAWN AEROBIC AND ANAEROBIC Blood Culture results may not be optimal due to an inadequate volume of blood received in culture bottles   Culture   Final    NO GROWTH < 24 HOURS Performed at [facility], [address], [place], [place] [address]    Report Status PENDING  Incomplete  Blood Culture (routine x 2)     Status: None (Preliminary result)   Collection Time: 07/27/2018 11:46 AM  Result Value Ref Range Status   Specimen Description BLOOD RAC  Final   Special Requests   Final    BOTTLES DRAWN AEROBIC AND ANAEROBIC Blood Culture adequate volume   Culture   Final    NO GROWTH < 24 HOURS Performed at [facility], [address] [place], [place] [address]    Report Status PENDING  Incomplete      Radiology Studies: Dg Chest Portable 1 View  Result Date: 07/25/2018 CLINICAL DATA:  Hypoxia in a patient who has tested positive for COVID-19. EXAM: PORTABLE CHEST 1 VIEW  COMPARISON:  Single-view of the chest 07/31/2016 and 07/21/2016. PA and lateral chest 10/18/2015. FINDINGS: Single nodular opacities in the lower lobes bilaterally are stable in appearance since the 2018 exams. No consolidative process, pneumothorax or effusion. Heart size is normal. No acute bony abnormality. IMPRESSION: No acute disease. No change in bilateral lower lobe nodular opacities since the 2018 studies. Electronically Signed   By: Inge Rise M.D.  On: 07/25/2018 11:51    Scheduled Meds: . nitroGLYCERIN  0.5 inch Topical Q6H   Continuous Infusions: . 0.45 % NaCl with KCl 20 mEq / L 100 mL/hr at 08/15/18 0732  . heparin 650 Units/hr (08/15/18 1018)     LOS: 1 day   Time spent: 35 minutes.  Patrecia Pour, MD [facility] [email or web address] Password [facility] 08/15/2018, 12:51 PM

## 2018-08-15 NOTE — Progress Notes (Signed)
ANTICOAGULATION CONSULT NOTE  Pharmacy Consult for Heparin Indication: r/o ACS and COVID-19  Allergies  Allergen Reactions  . Aspirin Other (See Comments)    GI intolerance  . Codeine Itching  . Fluzone [Flu Virus Vaccine]     Unknown reaction. Received info from nursing home  . Morphine And Related Itching  . Sulfa Antibiotics Itching  . Tramadol Other (See Comments)    Constipation    Patient Measurements:   Heparin Dosing Weight: 66 kg  Vital Signs: Temp: 98 F (36.7 C) (05/31 0800) Temp Source: Axillary (05/31 0544) BP: 110/49 (05/31 0800) Pulse Rate: 65 (05/31 0800)  Labs: Recent Labs    07/26/2018 1102 07/16/2018 1825 08/15/18 0012 08/15/18 0700  HGB 13.2  --  13.1  --   HCT 43.9  --  42.6  --   PLT 198  --  211  --   APTT  --   --  56*  --   LABPROT 18.7*  --   --   --   INR 1.6*  --   --   --   HEPARINUNFRC  --   --   --  0.95*  CREATININE 2.50* 2.20* 2.07*  --   TROPONINI  --  0.24* 0.20* 0.24*    CrCl cannot be calculated (Unknown ideal weight.).   Medical History: Past Medical History:  Diagnosis Date  . Arthritis   . Dementia ([place])   . Diabetes mellitus without complication ([place])   . Hypertension   . Uterine cancer [facility])     Assessment: 85 y/o F from LTCF with COVID outbreak admitted with AMS. Patient has a h/o DM, HTN, dementia, and uterine cancer. Pharmay consulted to manage heparin infusion for elevated troponin and hypercoagulable state associated with COVID-19.   Initial heparin level is elevated on 800 units/hr of IV heparin. H/H and Plt wnl. Per RN, no s/s of bleeding.   Goal of Therapy:  Heparin level 0.3-0.7 units/ml Monitor platelets by anticoagulation protocol: Yes   Plan:  Decrease IV heparin to 650 units/hr  Check anti-Xa level in 8 hours and daily while on heparin Continue to monitor H&H and platelets  Albertina Parr, PharmD., BCPS Clinical Pharmacist Clinical phone for 08/15/18 until 5pm: [phone]

## 2018-08-16 LAB — CBC WITH DIFFERENTIAL/PLATELET
Abs Immature Granulocytes: 0.15 10*3/uL — ABNORMAL HIGH (ref 0.00–0.07)
Basophils Absolute: 0 10*3/uL (ref 0.0–0.1)
Basophils Relative: 0 %
Eosinophils Absolute: 0 10*3/uL (ref 0.0–0.5)
Eosinophils Relative: 0 %
HCT: 38.7 % (ref 36.0–46.0)
Hemoglobin: 11.8 g/dL — ABNORMAL LOW (ref 12.0–15.0)
Immature Granulocytes: 2 %
Lymphocytes Relative: 8 %
Lymphs Abs: 0.6 10*3/uL — ABNORMAL LOW (ref 0.7–4.0)
MCH: 29.3 pg (ref 26.0–34.0)
MCHC: 30.5 g/dL (ref 30.0–36.0)
MCV: 96 fL (ref 80.0–100.0)
Monocytes Absolute: 0.2 10*3/uL (ref 0.1–1.0)
Monocytes Relative: 3 %
Neutro Abs: 6.9 10*3/uL (ref 1.7–7.7)
Neutrophils Relative %: 87 %
Platelets: 250 10*3/uL (ref 150–400)
RBC: 4.03 MIL/uL (ref 3.87–5.11)
RDW: 14.7 % (ref 11.5–15.5)
WBC: 7.9 10*3/uL (ref 4.0–10.5)
nRBC: 0.3 % — ABNORMAL HIGH (ref 0.0–0.2)

## 2018-08-16 LAB — FERRITIN: Ferritin: 1158 ng/mL — ABNORMAL HIGH (ref 11–307)

## 2018-08-16 LAB — MAGNESIUM: Magnesium: 2.5 mg/dL — ABNORMAL HIGH (ref 1.7–2.4)

## 2018-08-16 LAB — COMPREHENSIVE METABOLIC PANEL
ALT: 19 U/L (ref 0–44)
AST: 24 U/L (ref 15–41)
Albumin: 2.4 g/dL — ABNORMAL LOW (ref 3.5–5.0)
Alkaline Phosphatase: 64 U/L (ref 38–126)
BUN: 107 mg/dL — ABNORMAL HIGH (ref 8–23)
CO2: 21 mmol/L — ABNORMAL LOW (ref 22–32)
Calcium: 9.2 mg/dL (ref 8.9–10.3)
Chloride: >130 mmol/L (ref 98–111)
Creatinine, Ser: 1.38 mg/dL — ABNORMAL HIGH (ref 0.44–1.00)
GFR calc Af Amer: 41 mL/min — ABNORMAL LOW (ref >60)
GFR calc non Af Amer: 35 mL/min — ABNORMAL LOW (ref >60)
Glucose, Bld: 196 mg/dL — ABNORMAL HIGH (ref 70–99)
Potassium: 3.5 mmol/L (ref 3.5–5.1)
Sodium: 159 mmol/L — ABNORMAL HIGH (ref 135–145)
Total Bilirubin: 0.6 mg/dL (ref 0.3–1.2)
Total Protein: 6 g/dL — ABNORMAL LOW (ref 6.5–8.1)

## 2018-08-16 LAB — PHOSPHORUS: Phosphorus: 1.7 mg/dL — ABNORMAL LOW (ref 2.5–4.6)

## 2018-08-16 LAB — TROPONIN I: Troponin I: 0.09 ng/mL (ref <0.03)

## 2018-08-16 LAB — GLUCOSE, CAPILLARY
Glucose-Capillary: 171 mg/dL — ABNORMAL HIGH (ref 70–99)
Glucose-Capillary: 142 mg/dL — ABNORMAL HIGH (ref 70–99)
Glucose-Capillary: 200 mg/dL — ABNORMAL HIGH (ref 70–99)
Glucose-Capillary: 219 mg/dL — ABNORMAL HIGH (ref 70–99)

## 2018-08-16 LAB — HEPARIN LEVEL (UNFRACTIONATED)
Heparin Unfractionated: 0.66 IU/mL (ref 0.30–0.70)
Heparin Unfractionated: 0.55 IU/mL (ref 0.30–0.70)

## 2018-08-16 LAB — D-DIMER, QUANTITATIVE: D-Dimer, Quant: 1.38 ug/mL-FEU — ABNORMAL HIGH (ref 0.00–0.50)

## 2018-08-16 LAB — C-REACTIVE PROTEIN: CRP: 6 mg/dL — ABNORMAL HIGH (ref <1.0)

## 2018-08-16 MED ORDER — POTASSIUM CL IN DEXTROSE 5% 20 MEQ/L IV SOLN
20.0000 meq | INTRAVENOUS | Status: DC
  Administered 2018-08-16: 20 meq via INTRAVENOUS
  Filled 2018-08-16 (×2): qty 1000

## 2018-08-16 NOTE — Progress Notes (Signed)
ANTICOAGULATION CONSULT NOTE  Pharmacy Consult for Heparin Indication: r/o ACS and COVID-19  Allergies  Allergen Reactions  . Aspirin Other (See Comments)    GI intolerance  . Codeine Itching  . Fluzone [Flu Virus Vaccine]     Unknown reaction. Received info from nursing home  . Morphine And Related Itching  . Sulfa Antibiotics Itching  . Tramadol Other (See Comments)    Constipation    Patient Measurements:   Heparin Dosing Weight: 66 kg  Vital Signs: Temp: 97.5 F (36.4 C) (05/31 2020) Temp Source: Oral (05/31 2020) BP: 125/60 (05/31 2020) Pulse Rate: 62 (05/31 2020)  Labs: Recent Labs    08/13/2018 1102 08/10/2018 1825 08/15/18 0012 08/15/18 0700 08/15/18 1140 08/15/18 2012  HGB 13.2  --  13.1  --   --   --   HCT 43.9  --  42.6  --   --   --   PLT 198  --  211  --   --   --   APTT  --   --  56*  --   --   --   LABPROT 18.7*  --   --   --   --   --   INR 1.6*  --   --   --   --   --   HEPARINUNFRC  --   --   --  0.95*  --  0.66  CREATININE 2.50* 2.20* 2.07*  --  1.87*  --   TROPONINI  --  0.24* 0.20* 0.24*  --   --     CrCl cannot be calculated (Unknown ideal weight.).   Medical History: Past Medical History:  Diagnosis Date  . Arthritis   . Dementia ([place])   . Diabetes mellitus without complication ([place])   . Hypertension   . Uterine cancer [facility])     Assessment: 85 y/o F from LTCF with COVID outbreak admitted with AMS. Patient has a h/o DM, HTN, dementia, and uterine cancer. Pharmay consulted to manage heparin infusion for elevated troponin and hypercoagulable state associated with COVID-19.   Initial heparin level is elevated on 800 units/hr of IV heparin. H/H and Plt wnl. Per RN, no s/s of bleeding.     PM update:  HL is 0.66, therapeutic No issues with line or reported bleeding per RN   Goal of Therapy:  Heparin level 0.3-0.7 units/ml Monitor platelets by anticoagulation protocol: Yes   Plan:  Continue IV heparin to 650 units/hr  Check  daily while on heparin Continue to monitor H&H and platelets   Royetta Asal, PharmD, BCPS 08/16/2018 1:07 AM

## 2018-08-16 NOTE — Progress Notes (Signed)
CRITICAL VALUE ALERT  Critical Value:  Chloride > 130  Date & Time Notied:  08/16/18 0930 Provider Notified: Dr. Bonner Puna  Orders Received/Actions taken: Dr. Bonner Puna notified via [place]. Awaiting call back and/or orders.

## 2018-08-16 NOTE — Progress Notes (Signed)
[place] for Heparin Indication: r/o ACS (COVID-19)  Allergies  Allergen Reactions  . Aspirin Other (See Comments)    GI intolerance  . Codeine Itching  . Fluzone [Flu Virus Vaccine]     Unknown reaction. Received info from nursing home  . Morphine And Related Itching  . Sulfa Antibiotics Itching  . Tramadol Other (See Comments)    Constipation    Patient Measurements:   Heparin Dosing Weight: 66 kg  Vital Signs: Temp: 97.7 F (36.5 C) (06/01 0500) Temp Source: Axillary (06/01 0500) BP: 106/56 (06/01 0400) Pulse Rate: 62 (05/31 2020)  Labs: Recent Labs    08/13/2018 1102 08/04/2018 1825 08/15/18 0012 08/15/18 0700 08/15/18 1140 08/15/18 2012  HGB 13.2  --  13.1  --   --   --   HCT 43.9  --  42.6  --   --   --   PLT 198  --  211  --   --   --   APTT  --   --  56*  --   --   --   LABPROT 18.7*  --   --   --   --   --   INR 1.6*  --   --   --   --   --   HEPARINUNFRC  --   --   --  0.95*  --  0.66  CREATININE 2.50* 2.20* 2.07*  --  1.87*  --   TROPONINI  --  0.24* 0.20* 0.24*  --   --     CrCl cannot be calculated (Unknown ideal weight.).   Medical History: Past Medical History:  Diagnosis Date  . Arthritis   . Dementia ([place])   . Diabetes mellitus without complication ([place])   . Hypertension   . Uterine cancer [facility])     Assessment: 85 y/o F from LTCF with COVID outbreak admitted with AMS. Patient has a h/o DM, HTN, dementia, and uterine cancer. Pharmay consulted to manage heparin infusion for elevated troponin and hypercoagulable state associated with COVID-19.   HL 0.55, therapeutic on heparin at 650 units/hr. SCr down to 1.38 CBC: Hgb down to 11.8, Plt stable/WNL. No s/s of bleeding reported.   Goal of Therapy:  Heparin level 0.3-0.7 units/ml Monitor platelets by anticoagulation protocol: Yes   Plan:   Continue IV heparin at 650 units/hr   Check anti-Xa level daily while on heparin  Follow up duration of  heparin anticoagulation for r/o ACS.  Continue to monitor H&H and platelets  Gretta Arab PharmD, BCPS Clinical Pharmacist Clinical phone 7am- 5pm: [phone] 08/16/2018 7:25 AM

## 2018-08-16 NOTE — Progress Notes (Addendum)
PROGRESS NOTE  Felma Pfefferle  JIR:[id number] DOB: [birth year] DOA: 08/06/2018 PCP: Kirk Ruths, MD   Brief Narrative: Ethleen Lormand is an 85 y.o. female with a history of dementia, diet-controlled T2DM, HTN, and uterine cancer who presented to the ED from her nursing home where she had recently tested positive for covid due to diminished responsiveness. She was reportedly hypoxic and on NRB at time of presentation. This was weaned in the ED, though she was found to be in renal failure with hypernatremia and lactic acidosis. Covid positivity confirmed and admission to [facility] requested with IV fluids. Renal function has improved slowly, though sodium remains elevated, and encephalopathy has persisted. Inflammatory markers have risen.   Assessment & Plan: Principal Problem:   Acute respiratory disease due to COVID-19 virus Active Problems:   Acute encephalopathy   Pressure injury of skin   Dementia (HCC)   Hypertension   Acute renal failure (HCC)  Covid-19 infection with acute hypoxic respiratory failure despite no infiltrates on CXR:   - Continue airborne, contact precautions. PPE including surgical gown, gloves, face shield, cap, shoe covers, and N-95 used during this encounter in a negative pressure room.  - Trend inflammatory labs. Improving today.  - Avoid NSAIDs - Not currently able to prone or use incentive spirometry - No current wheezing.  - Goals of care have been discussed with family who are understanding that the patient's prognosis remains guarded. Were she to decompensate, comfort measures only would be plan of care.  Acute renal failure: Improving w/IVF - Continue IVF's - Monitor I/O  Hypernatremia: Given NS bolus and hypotonic saline thus far. Suspect dehydration in setting of encephalopathy, overall volume becoming euvolemic.  - Change to D5W w/71mEq potassium @100cc /hr and follow metabolic panel.  Hyperglycemia, T2DM:  - Start SSI AC/HS. Improving. If no po intake,  can convert to q4h.  Elevated troponin: Suspect due to demand ischemia in setting of covid-19 and renal failure. Trending downward.  - No STEMI on ECG. - Started heparin gtt, will continue for 48 hrs. Hgb down some, though coincides with fluids. No bleeding noted. Monitor CBC. - Has allergy to aspirin and morphine  HTN: Soft BP.  - Hold hydralazine  Iron deficiency: No anemia currently.  - Hold po iron for now  Acute metabolic encephalopathy on chronic dementia: Due to viral infection, hypernatremia, and renal failure.  - Delirium precautions  Multiple Pressure Injuries: All POA, listed below. Plan to offload as able. Pressure Ulcer 10/17/15 Stage II -  Partial thickness loss of dermis presenting as a shallow open ulcer with a red, pink wound bed without slough. red, open, .5 x .5 inch (Active)  10/17/15 2000  Location: Coccyx  Location Orientation: Mid  Staging: Stage II -  Partial thickness loss of dermis presenting as a shallow open ulcer with a red, pink wound bed without slough.  Wound Description (Comments): red, open, .5 x .5 inch  Present on Admission: Yes     Pressure Ulcer 10/17/15 Stage I -  Intact skin with non-blanchable redness of a localized area usually over a bony prominence. closed, dry  (Active)  10/17/15 2000  Location: Buttocks  Location Orientation: Left  Staging: Stage I -  Intact skin with non-blanchable redness of a localized area usually over a bony prominence.  Wound Description (Comments): closed, dry   Present on Admission: Yes     Pressure Injury 08/08/2018 Buttocks Right Stage I -  Intact skin with non-blanchable redness of a localized area usually over  a bony prominence. (Active)  08/13/2018 1744  Location: Buttocks  Location Orientation: Right  Staging: Stage I -  Intact skin with non-blanchable redness of a localized area usually over a bony prominence.  Wound Description (Comments):   Present on Admission: Yes     Pressure Injury 08/01/2018  Ischial tuberosity Right Stage I -  Intact skin with non-blanchable redness of a localized area usually over a bony prominence. (Active)  08/03/2018 1745  Location: Ischial tuberosity  Location Orientation: Right  Staging: Stage I -  Intact skin with non-blanchable redness of a localized area usually over a bony prominence.  Wound Description (Comments):   Present on Admission: Yes     Pressure Injury 08/08/2018 Sacrum Medial Deep Tissue Injury - Purple or maroon localized area of discolored intact skin or blood-filled blister due to damage of underlying soft tissue from pressure and/or shear. (Active)  07/26/2018 1746  Location: Sacrum  Location Orientation: Medial  Staging: Deep Tissue Injury - Purple or maroon localized area of discolored intact skin or blood-filled blister due to damage of underlying soft tissue from pressure and/or shear.  Wound Description (Comments):   Present on Admission: Yes     Pressure Injury 08/09/2018 Foot Right;Lateral Deep Tissue Injury - Purple or maroon localized area of discolored intact skin or blood-filled blister due to damage of underlying soft tissue from pressure and/or shear. (Active)  08/15/2018 1747  Location: Foot  Location Orientation: Right;Lateral  Staging: Deep Tissue Injury - Purple or maroon localized area of discolored intact skin or blood-filled blister due to damage of underlying soft tissue from pressure and/or shear.  Wound Description (Comments):   Present on Admission: Yes     Pressure Injury 07/25/2018 Heel Left Deep Tissue Injury - Purple or maroon localized area of discolored intact skin or blood-filled blister due to damage of underlying soft tissue from pressure and/or shear. blister like pressure ulcer (Active)  07/31/2018 1748  Location: Heel  Location Orientation: Left  Staging: Deep Tissue Injury - Purple or maroon localized area of discolored intact skin or blood-filled blister due to damage of underlying soft tissue from pressure  and/or shear.  Wound Description (Comments): blister like pressure ulcer  Present on Admission: Yes   DVT prophylaxis: Heparin gtt Code Status: DNR Family Communication: None at bedside, spoke with daughter at length 5/31, will call again today. Disposition Plan: Uncertain, guarded prognosis.  Consultants:   None  Procedures:   None  Antimicrobials:  None   Subjective: No major changes. Pt still nonverbal, requiring oral care. Has not eaten breakfast. Daughter reports her baseline is to have a "great appetite." Unable to tell me if she's experiencing chest pain.   Objective: Vitals:   08/16/18 0000 08/16/18 0400 08/16/18 0500 08/16/18 0739  BP:  (!) 106/56  (!) 126/59  Pulse:    77  Resp:      Temp: (!) 97.5 F (36.4 C) 97.7 F (36.5 C) 97.7 F (36.5 C) (!) 97.3 F (36.3 C)  TempSrc: Axillary Axillary Axillary Axillary  SpO2:        Intake/Output Summary (Last 24 hours) at 08/16/2018 0935 Last data filed at 08/16/2018 0300 Gross per 24 hour  Intake 1941.41 ml  Output 850 ml  Net 1091.41 ml   Gen: Frail, elderly female in no distress Pulm: Nonlabored breathing room air. Clear. CV: Regular rate and rhythm. No murmur, rub, or gallop. No JVD, no dependent edema. GI: Abdomen soft, non-tender, non-distended, with normoactive bowel sounds.  Ext: Warm,  no deformities Skin: No new rashes, lesions or ulcers on visualized skin. Foam pressure dressings stable.. Nitropaste on chest unchanged.  Neuro: Alert, not oriented. Not cooperative with exam Psych: Judgement and insight appear impaired.   Data Reviewed: I have personally reviewed following labs and imaging studies  CBC: Recent Labs  Lab 07/18/2018 1102 08/15/18 0012 08/16/18 0650  WBC 10.4 9.2 7.9  NEUTROABS 9.3* 8.2* 6.9  HGB 13.2 13.1 11.8*  HCT 43.9 42.6 38.7  MCV 96.7 95.7 96.0  PLT 198 211 315   Basic Metabolic Panel: Recent Labs  Lab 08/08/2018 1102 08/02/2018 1825 08/15/18 0012 08/15/18 1140 08/16/18  0650  NA 160* 160* 159* 160* 159*  K 3.9 3.8 3.2* 3.6 3.5  CL 126* 125* 127* 128* >130*  CO2 22 24 22 23  21*  GLUCOSE 238* 238* 228* 222* 196*  BUN 158* 116* 117* 147* 107*  CREATININE 2.50* 2.20* 2.07* 1.87* 1.38*  CALCIUM 9.3 9.7 9.6 9.5 9.2  MG  --   --  2.7*  --  2.5*  PHOS  --   --  2.6  --  1.7*   GFR: CrCl cannot be calculated (Unknown ideal weight.). Liver Function Tests: Recent Labs  Lab 08/11/2018 1102 08/03/2018 1825 08/15/18 0012 08/16/18 0650  AST 41 30 28 24   ALT 26 23 23 19   ALKPHOS 55 57 60 64  BILITOT 1.4* 0.5 0.5 0.6  PROT 7.0 6.9 6.8 6.0*  ALBUMIN 2.5* 2.5* 2.5* 2.4*   Recent Labs  Lab 07/20/2018 1102  LIPASE 36   No results for input(s): AMMONIA in the last 168 hours. Coagulation Profile: Recent Labs  Lab 08/13/2018 1102  INR 1.6*   Cardiac Enzymes: Recent Labs  Lab 07/23/2018 1825 08/15/18 0012 08/15/18 0700 08/16/18 0650  TROPONINI 0.24* 0.20* 0.24* 0.09*   BNP (last 3 results) No results for input(s): PROBNP in the last 8760 hours. HbA1C: No results for input(s): HGBA1C in the last 72 hours. CBG: Recent Labs  Lab 08/15/18 1613 08/15/18 2055  GLUCAP 156* 155*   Lipid Profile: No results for input(s): CHOL, HDL, LDLCALC, TRIG, CHOLHDL, LDLDIRECT in the last 72 hours. Thyroid Function Tests: No results for input(s): TSH, T4TOTAL, FREET4, T3FREE, THYROIDAB in the last 72 hours. Anemia Panel: Recent Labs    08/15/18 0012 08/16/18 0500  FERRITIN 1,411* 1,158*   Urine analysis:    Component Value Date/Time   COLORURINE YELLOW (A) 08/01/2018 1145   APPEARANCEUR HAZY (A) 07/30/2018 1145   APPEARANCEUR Hazy 04/14/2013 1852   LABSPEC 1.021 07/29/2018 1145   LABSPEC 1.013 04/14/2013 1852   PHURINE 5.0 07/29/2018 1145   GLUCOSEU NEGATIVE 08/07/2018 1145   GLUCOSEU 50 mg/dL 04/14/2013 1852   HGBUR SMALL (A) 08/10/2018 1145   BILIRUBINUR NEGATIVE 08/09/2018 1145   BILIRUBINUR Negative 04/14/2013 1852   KETONESUR NEGATIVE 07/21/2018  1145   PROTEINUR NEGATIVE 08/13/2018 1145   NITRITE NEGATIVE 07/31/2018 1145   LEUKOCYTESUR NEGATIVE 08/12/2018 1145   LEUKOCYTESUR Negative 04/14/2013 1852   Recent Results (from the past 240 hour(s))  SARS Coronavirus 2 (CEPHEID- Performed in [place] hospital lab), Hosp Order     Status: Abnormal   Collection Time: 07/27/2018 11:02 AM  Result Value Ref Range Status   SARS Coronavirus 2 POSITIVE (A) NEGATIVE Final    Comment: RESULT CALLED TO, READ BACK BY AND VERIFIED WITH: HUNTER ORE AT 1229 07/26/2018.PMF (NOTE) If result is NEGATIVE SARS-CoV-2 target nucleic acids are NOT DETECTED. The SARS-CoV-2 RNA is generally detectable in upper and  lower  respiratory specimens during the acute phase of infection. The lowest  concentration of SARS-CoV-2 viral copies this assay can detect is 250  copies / mL. A negative result does not preclude SARS-CoV-2 infection  and should not be used as the sole basis for treatment or other  patient management decisions.  A negative result may occur with  improper specimen collection / handling, submission of specimen other  than nasopharyngeal swab, presence of viral mutation(s) within the  areas targeted by this assay, and inadequate number of viral copies  (<250 copies / mL). A negative result must be combined with clinical  observations, patient history, and epidemiological information. If result is POSITIVE SARS-CoV-2 target nucleic acids are DETECTED. The  SARS-CoV-2 RNA is generally detectable in upper and lower  respiratory specimens during the acute phase of infection.  Positive  results are indicative of active infection with SARS-CoV-2.  Clinical  correlation with patient history and other diagnostic information is  necessary to determine patient infection status.  Positive results do  not rule out bacterial infection or co-infection with other viruses. If result is PRESUMPTIVE POSTIVE SARS-CoV-2 nucleic acids MAY BE PRESENT.   A  presumptive positive result was obtained on the submitted specimen  and confirmed on repeat testing.  While 2019 novel coronavirus  (SARS-CoV-2) nucleic acids may be present in the submitted sample  additional confirmatory testing may be necessary for epidemiological  and / or clinical management purposes  to differentiate between  SARS-CoV-2 and other Sarbecovirus currently known to infect humans.  If clinically indicated additional testing with an alternate test  methodology [phone]) is ad vised. The SARS-CoV-2 RNA is generally  detectable in upper and lower respiratory specimens during the acute  phase of infection. The expected result is Negative. Fact Sheet for Patients:  StrictlyIdeas.no Fact Sheet for Healthcare Providers: BankingDealers.co.za This test is not yet approved or cleared by the [place] FDA and has been authorized for detection and/or diagnosis of SARS-CoV-2 by FDA under an Emergency Use Authorization (EUA).  This EUA will remain in effect (meaning this test can be used) for the duration of the COVID-19 declaration under Section 564(b)(1) of the Act, 21 U.S.C. section 360bbb-3(b)(1), unless the authorization is terminated or revoked sooner. Performed at [facility], [address] [place], [place] [address]   Blood Culture (routine x 2)     Status: None (Preliminary result)   Collection Time: 07/23/2018 11:45 AM  Result Value Ref Range Status   Specimen Description BLOOD R FA  Final   Special Requests   Final    BOTTLES DRAWN AEROBIC AND ANAEROBIC Blood Culture results may not be optimal due to an inadequate volume of blood received in culture bottles   Culture   Final    NO GROWTH 2 DAYS Performed at [facility], [address], [place], [place] [address]    Report Status PENDING  Incomplete  Blood Culture (routine x 2)     Status: None (Preliminary result)   Collection Time: 08/02/2018  11:46 AM  Result Value Ref Range Status   Specimen Description BLOOD RAC  Final   Special Requests   Final    BOTTLES DRAWN AEROBIC AND ANAEROBIC Blood Culture adequate volume   Culture   Final    NO GROWTH 2 DAYS Performed at [facility], [address], [place], [place] [address]    Report Status PENDING  Incomplete      Radiology Studies: Dg Chest Portable 1  View  Result Date: 07/26/2018 CLINICAL DATA:  Hypoxia in a patient who has tested positive for COVID-19. EXAM: PORTABLE CHEST 1 VIEW COMPARISON:  Single-view of the chest 07/31/2016 and 07/21/2016. PA and lateral chest 10/18/2015. FINDINGS: Single nodular opacities in the lower lobes bilaterally are stable in appearance since the 2018 exams. No consolidative process, pneumothorax or effusion. Heart size is normal. No acute bony abnormality. IMPRESSION: No acute disease. No change in bilateral lower lobe nodular opacities since the 2018 studies. Electronically Signed   By: Inge Rise M.D.   On: 07/16/2018 11:51    Scheduled Meds: . insulin aspart  0-5 Units Subcutaneous QHS  . insulin aspart  0-9 Units Subcutaneous TID WC  . nitroGLYCERIN  0.5 inch Topical Q6H   Continuous Infusions: . dextrose 5 % with KCl 20 mEq / L    . heparin 650 Units/hr (08/16/18 0300)     LOS: 2 days   Time spent: 35 minutes.  Patrecia Pour, MD [facility] [email or web address] Password [email or web address] 08/16/2018, 9:35 AM

## 2018-08-16 DEATH — deceased

## 2018-08-17 LAB — CBC WITH DIFFERENTIAL/PLATELET
Abs Immature Granulocytes: 0.12 10*3/uL — ABNORMAL HIGH (ref 0.00–0.07)
Basophils Absolute: 0 10*3/uL (ref 0.0–0.1)
Basophils Relative: 0 %
Eosinophils Absolute: 0 10*3/uL (ref 0.0–0.5)
Eosinophils Relative: 0 %
HCT: 36.8 % (ref 36.0–46.0)
Hemoglobin: 11 g/dL — ABNORMAL LOW (ref 12.0–15.0)
Immature Granulocytes: 2 %
Lymphocytes Relative: 7 %
Lymphs Abs: 0.5 10*3/uL — ABNORMAL LOW (ref 0.7–4.0)
MCH: 28.7 pg (ref 26.0–34.0)
MCHC: 29.9 g/dL — ABNORMAL LOW (ref 30.0–36.0)
MCV: 96.1 fL (ref 80.0–100.0)
Monocytes Absolute: 0.2 10*3/uL (ref 0.1–1.0)
Monocytes Relative: 3 %
Neutro Abs: 6.8 10*3/uL (ref 1.7–7.7)
Neutrophils Relative %: 88 %
Platelets: 260 10*3/uL (ref 150–400)
RBC: 3.83 MIL/uL — ABNORMAL LOW (ref 3.87–5.11)
RDW: 14.9 % (ref 11.5–15.5)
WBC: 7.7 10*3/uL (ref 4.0–10.5)
nRBC: 0.4 % — ABNORMAL HIGH (ref 0.0–0.2)

## 2018-08-17 LAB — COMPREHENSIVE METABOLIC PANEL
ALT: 19 U/L (ref 0–44)
AST: 21 U/L (ref 15–41)
Albumin: 2.2 g/dL — ABNORMAL LOW (ref 3.5–5.0)
Alkaline Phosphatase: 51 U/L (ref 38–126)
Anion gap: 8 (ref 5–15)
BUN: 88 mg/dL — ABNORMAL HIGH (ref 8–23)
CO2: 21 mmol/L — ABNORMAL LOW (ref 22–32)
Calcium: 8.9 mg/dL (ref 8.9–10.3)
Chloride: 125 mmol/L — ABNORMAL HIGH (ref 98–111)
Creatinine, Ser: 1.13 mg/dL — ABNORMAL HIGH (ref 0.44–1.00)
GFR calc Af Amer: 52 mL/min — ABNORMAL LOW (ref >60)
GFR calc non Af Amer: 45 mL/min — ABNORMAL LOW (ref >60)
Glucose, Bld: 272 mg/dL — ABNORMAL HIGH (ref 70–99)
Potassium: 3.7 mmol/L (ref 3.5–5.1)
Sodium: 154 mmol/L — ABNORMAL HIGH (ref 135–145)
Total Bilirubin: 0.5 mg/dL (ref 0.3–1.2)
Total Protein: 5.6 g/dL — ABNORMAL LOW (ref 6.5–8.1)

## 2018-08-17 LAB — D-DIMER, QUANTITATIVE: D-Dimer, Quant: 1.01 ug/mL-FEU — ABNORMAL HIGH (ref 0.00–0.50)

## 2018-08-17 LAB — HEPARIN LEVEL (UNFRACTIONATED): Heparin Unfractionated: 0.44 IU/mL (ref 0.30–0.70)

## 2018-08-17 LAB — C-REACTIVE PROTEIN: CRP: 4.8 mg/dL — ABNORMAL HIGH (ref <1.0)

## 2018-08-17 LAB — GLUCOSE, CAPILLARY
Glucose-Capillary: 143 mg/dL — ABNORMAL HIGH (ref 70–99)
Glucose-Capillary: 167 mg/dL — ABNORMAL HIGH (ref 70–99)
Glucose-Capillary: 260 mg/dL — ABNORMAL HIGH (ref 70–99)
Glucose-Capillary: 305 mg/dL — ABNORMAL HIGH (ref 70–99)

## 2018-08-17 LAB — MAGNESIUM: Magnesium: 2.3 mg/dL (ref 1.7–2.4)

## 2018-08-17 LAB — FERRITIN: Ferritin: 1037 ng/mL — ABNORMAL HIGH (ref 11–307)

## 2018-08-17 LAB — PHOSPHORUS: Phosphorus: 1.3 mg/dL — ABNORMAL LOW (ref 2.5–4.6)

## 2018-08-17 MED ORDER — NITROGLYCERIN 0.4 MG SL SUBL: 0.4000 mg | SUBLINGUAL_TABLET | SUBLINGUAL | Status: DC | PRN

## 2018-08-17 MED ORDER — POTASSIUM PHOSPHATES 15 MMOLE/5ML IV SOLN
20.0000 mmol | Freq: Once | INTRAVENOUS | Status: AC
  Administered 2018-08-17: 20 mmol via INTRAVENOUS
  Filled 2018-08-17: qty 6.67

## 2018-08-17 MED ORDER — INSULIN ASPART 100 UNIT/ML ~~LOC~~ SOLN
0.0000 [IU] | SUBCUTANEOUS | Status: DC
  Administered 2018-08-17: 5 [IU] via SUBCUTANEOUS
  Administered 2018-08-17 – 2018-08-18 (×3): 2 [IU] via SUBCUTANEOUS
  Administered 2018-08-18: 3 [IU] via SUBCUTANEOUS
  Administered 2018-08-18: 1 [IU] via SUBCUTANEOUS

## 2018-08-17 MED ORDER — BISACODYL 10 MG RE SUPP
10.0000 mg | Freq: Every day | RECTAL | Status: DC | PRN
  Administered 2018-08-18: 10 mg via RECTAL
  Filled 2018-08-17: qty 1

## 2018-08-17 MED ORDER — DEXTROSE 5 % IV SOLN
INTRAVENOUS | Status: DC
  Administered 2018-08-17 – 2018-08-18 (×2): via INTRAVENOUS

## 2018-08-17 MED ORDER — ENOXAPARIN SODIUM 40 MG/0.4ML ~~LOC~~ SOLN
40.0000 mg | Freq: Every day | SUBCUTANEOUS | Status: DC
  Administered 2018-08-17 – 2018-08-18 (×2): 40 mg via SUBCUTANEOUS
  Filled 2018-08-17 (×2): qty 0.4

## 2018-08-17 NOTE — Progress Notes (Signed)
[place] for Heparin Indication: r/o ACS (COVID-19)  Allergies  Allergen Reactions  . Aspirin Other (See Comments)    GI intolerance  . Codeine Itching  . Fluzone [Flu Virus Vaccine]     Unknown reaction. Received info from nursing home  . Morphine And Related Itching  . Sulfa Antibiotics Itching  . Tramadol Other (See Comments)    Constipation    Patient Measurements:   Heparin Dosing Weight: 66 kg  Vital Signs: Temp: 97.4 F (36.3 C) (06/02 0638) Temp Source: Oral (06/02 1700) BP: 139/62 (06/02 0000) Pulse Rate: 56 (06/02 0000)  Labs: Recent Labs    07/26/2018 1102  08/15/18 0012  08/15/18 0700 08/15/18 1140 08/15/18 2012 08/16/18 0650 08/16/18 0653 08/17/18 0502  HGB 13.2  --  13.1  --   --   --   --  11.8*  --  11.0*  HCT 43.9  --  42.6  --   --   --   --  38.7  --  36.8  PLT 198  --  211  --   --   --   --  250  --  260  APTT  --   --  56*  --   --   --   --   --   --   --   LABPROT 18.7*  --   --   --   --   --   --   --   --   --   INR 1.6*  --   --   --   --   --   --   --   --   --   HEPARINUNFRC  --   --   --    < > 0.95*  --  0.66  --  0.55 0.44  CREATININE 2.50*   < > 2.07*  --   --  1.87*  --  1.38*  --  1.13*  TROPONINI  --    < > 0.20*  --  0.24*  --   --  0.09*  --   --    < > = values in this interval not displayed.    CrCl cannot be calculated (Unknown ideal weight.).   Medical History: Past Medical History:  Diagnosis Date  . Arthritis   . Dementia ([place])   . Diabetes mellitus without complication ([place])   . Hypertension   . Uterine cancer [facility])     Assessment: 85 y/o F from LTCF with COVID outbreak admitted with AMS. Patient has a h/o DM, HTN, dementia, and uterine cancer. Pharmay consulted to manage heparin infusion for elevated troponin and hypercoagulable state associated with COVID-19.   HL 0.44, therapeutic on heparin at 650 units/hr. SCr down to 1.13 CBC: Hgb down to 11, Plt  stable/WNL. No s/s of bleeding reported.   Goal of Therapy:  Heparin level 0.3-0.7 units/ml Monitor platelets by anticoagulation protocol: Yes   Plan:   Continue IV heparin at 650 units/hr   Check anti-Xa level daily while on heparin  Follow up duration of heparin anticoagulation for r/o ACS.  Continue to monitor H&H and platelets  Gretta Arab PharmD, BCPS Clinical Pharmacist Clinical phone 7am- 5pm: [phone] 08/17/2018 8:38 AM

## 2018-08-17 NOTE — Progress Notes (Signed)
PROGRESS NOTE  Melinda Hughes  NOM:[id number] DOB: [birth year] DOA: 07/18/2018 PCP: Kirk Ruths, MD   Brief Narrative: Melinda Hughes is an 85 y.o. female with a history of dementia, diet-controlled T2DM, HTN, and uterine cancer who presented to the ED from her nursing home where she had recently tested positive for covid due to diminished responsiveness. She was reportedly hypoxic and on NRB at time of presentation. This was weaned in the ED, though she was found to be in renal failure with hypernatremia and lactic acidosis. Covid positivity confirmed and admission to [facility] requested with IV fluids. Renal function has improved slowly, though sodium remains elevated, and encephalopathy has persisted. Inflammatory markers have risen.   Assessment & Plan: Principal Problem:   Acute respiratory disease due to COVID-19 virus Active Problems:   Acute encephalopathy   Pressure injury of skin   Dementia (HCC)   Hypertension   Acute renal failure (HCC)  Covid-19 infection with acute hypoxic respiratory failure despite no infiltrates on CXR:   - Continue airborne, contact precautions. PPE including surgical gown, gloves, face shield, cap, shoe covers, and N-95 used during this encounter in a negative pressure room.  - Trend inflammatory labs. Improving today.  - Avoid NSAIDs - Not currently able to prone or use incentive spirometry - No current wheezing.  - Goals of care have been discussed with family who are understanding that the patient's prognosis remains guarded. Were she to decompensate, comfort measures only would be plan of care.  Acute renal failure: Continues to improve. - Continue IVF's - Monitor I/O  Hypernatremia, hyperchloremia:  - Continue D5W and follow metabolic panel. Rate of correction is appropriate  Hypophosphatemia:  - Replace with K phos and monitor.  Hyperglycemia, T2DM:  - Started SSI, still hyperglycemic with no hypoglycemia. Augment regimen to q4h CBG/SSI and  follow.  Elevated troponin: Suspect due to demand ischemia in setting of covid-19 and renal failure. Trending downward.  - No STEMI on ECG. Stop heparin gtt s/p 48 hrs, convert to lovenox DVT ppx.  - Hgb down some, though coincides with fluids. No bleeding noted. Recheck CBC in AM. - Has allergy to aspirin and morphine - NTG prn. Can stop nitropaste.  HTN: Soft BP.  - Hold hydralazine  Iron deficiency: No anemia currently.  - Hold po iron for now  Acute metabolic encephalopathy on chronic dementia: Due to viral infection, hypernatremia, and renal failure.  - Delirium precautions  Multiple Pressure Injuries: All POA, listed below. Plan to offload as able. Pressure Ulcer 10/17/15 Stage II -  Partial thickness loss of dermis presenting as a shallow open ulcer with a red, pink wound bed without slough. red, open, .5 x .5 inch (Active)  10/17/15 2000  Location: Coccyx  Location Orientation: Mid  Staging: Stage II -  Partial thickness loss of dermis presenting as a shallow open ulcer with a red, pink wound bed without slough.  Wound Description (Comments): red, open, .5 x .5 inch  Present on Admission: Yes     Pressure Ulcer 10/17/15 Stage I -  Intact skin with non-blanchable redness of a localized area usually over a bony prominence. closed, dry  (Active)  10/17/15 2000  Location: Buttocks  Location Orientation: Left  Staging: Stage I -  Intact skin with non-blanchable redness of a localized area usually over a bony prominence.  Wound Description (Comments): closed, dry   Present on Admission: Yes     Pressure Injury 07/18/2018 Buttocks Right Stage I -  Intact skin  with non-blanchable redness of a localized area usually over a bony prominence. (Active)  08/13/2018 1744  Location: Buttocks  Location Orientation: Right  Staging: Stage I -  Intact skin with non-blanchable redness of a localized area usually over a bony prominence.  Wound Description (Comments):   Present on Admission:  Yes     Pressure Injury 07/17/2018 Ischial tuberosity Right Stage I -  Intact skin with non-blanchable redness of a localized area usually over a bony prominence. (Active)  08/08/2018 1745  Location: Ischial tuberosity  Location Orientation: Right  Staging: Stage I -  Intact skin with non-blanchable redness of a localized area usually over a bony prominence.  Wound Description (Comments):   Present on Admission: Yes     Pressure Injury 07/16/2018 Sacrum Medial Deep Tissue Injury - Purple or maroon localized area of discolored intact skin or blood-filled blister due to damage of underlying soft tissue from pressure and/or shear. (Active)  07/29/2018 1746  Location: Sacrum  Location Orientation: Medial  Staging: Deep Tissue Injury - Purple or maroon localized area of discolored intact skin or blood-filled blister due to damage of underlying soft tissue from pressure and/or shear.  Wound Description (Comments):   Present on Admission: Yes     Pressure Injury 07/22/2018 Foot Right;Lateral Deep Tissue Injury - Purple or maroon localized area of discolored intact skin or blood-filled blister due to damage of underlying soft tissue from pressure and/or shear. (Active)  08/06/2018 1747  Location: Foot  Location Orientation: Right;Lateral  Staging: Deep Tissue Injury - Purple or maroon localized area of discolored intact skin or blood-filled blister due to damage of underlying soft tissue from pressure and/or shear.  Wound Description (Comments):   Present on Admission: Yes     Pressure Injury 07/17/2018 Heel Left Deep Tissue Injury - Purple or maroon localized area of discolored intact skin or blood-filled blister due to damage of underlying soft tissue from pressure and/or shear. blister like pressure ulcer (Active)  07/26/2018 1748  Location: Heel  Location Orientation: Left  Staging: Deep Tissue Injury - Purple or maroon localized area of discolored intact skin or blood-filled blister due to damage of  underlying soft tissue from pressure and/or shear.  Wound Description (Comments): blister like pressure ulcer  Present on Admission: Yes   DVT prophylaxis: Heparin gtt Code Status: DNR Family Communication: None at bedside, called Disposition Plan: Uncertain, guarded prognosis.  Consultants:   None  Procedures:   None  Antimicrobials:  None   Subjective: No po intake, no agitation reported. No new complaints/pt nonverbal. Daughter says that the patient is not stirring and not eating is very unlike her.  Objective: Vitals:   08/16/18 2300 08/17/18 0000 08/17/18 0638 08/17/18 0915  BP:  139/62  100/86  Pulse:  (!) 56  (!) 58  Resp:  (!) 24    Temp: 97.7 F (36.5 C)  (!) 97.4 F (36.3 C) (!) 96 F (35.6 C)  TempSrc: Axillary  Oral Axillary  SpO2:  93%  97%    Intake/Output Summary (Last 24 hours) at 08/17/2018 1021 Last data filed at 08/17/2018 0500 Gross per 24 hour  Intake 1607.6 ml  Output 325 ml  Net 1282.6 ml   Gen: 85 y.o. female in no distress Pulm: Nonlabored breathing room air. Clear. CV: Regular rate and rhythm. No murmur, rub, or gallop. No JVD, no dependent edema. GI: Abdomen soft, non-tender, non-distended, with normoactive bowel sounds.  Ext: Warm, no deformities Skin: No rashes, lesions or ulcers on visualized  skin. Neuro: Opens eyes to touch and loud voice, nonverbal, does not track  Psych: Judgement and insight appear impaired  Data Reviewed: I have personally reviewed following labs and imaging studies  CBC: Recent Labs  Lab 08/08/2018 1102 08/15/18 0012 08/16/18 0650 08/17/18 0502  WBC 10.4 9.2 7.9 7.7  NEUTROABS 9.3* 8.2* 6.9 6.8  HGB 13.2 13.1 11.8* 11.0*  HCT 43.9 42.6 38.7 36.8  MCV 96.7 95.7 96.0 96.1  PLT 198 211 250 371   Basic Metabolic Panel: Recent Labs  Lab 08/13/2018 1825 08/15/18 0012 08/15/18 1140 08/16/18 0650 08/17/18 0502  NA 160* 159* 160* 159* 154*  K 3.8 3.2* 3.6 3.5 3.7  CL 125* 127* 128* >130* 125*  CO2 24  22 23  21* 21*  GLUCOSE 238* 228* 222* 196* 272*  BUN 116* 117* 147* 107* 88*  CREATININE 2.20* 2.07* 1.87* 1.38* 1.13*  CALCIUM 9.7 9.6 9.5 9.2 8.9  MG  --  2.7*  --  2.5* 2.3  PHOS  --  2.6  --  1.7* 1.3*   GFR: CrCl cannot be calculated (Unknown ideal weight.). Liver Function Tests: Recent Labs  Lab 08/13/2018 1102 07/27/2018 1825 08/15/18 0012 08/16/18 0650 08/17/18 0502  AST 41 30 28 24 21   ALT 26 23 23 19 19   ALKPHOS 55 57 60 64 51  BILITOT 1.4* 0.5 0.5 0.6 0.5  PROT 7.0 6.9 6.8 6.0* 5.6*  ALBUMIN 2.5* 2.5* 2.5* 2.4* 2.2*   Recent Labs  Lab 07/27/2018 1102  LIPASE 36   No results for input(s): AMMONIA in the last 168 hours. Coagulation Profile: Recent Labs  Lab 08/07/2018 1102  INR 1.6*   Cardiac Enzymes: Recent Labs  Lab 07/20/2018 1825 08/15/18 0012 08/15/18 0700 08/16/18 0650  TROPONINI 0.24* 0.20* 0.24* 0.09*   BNP (last 3 results) No results for input(s): PROBNP in the last 8760 hours. HbA1C: No results for input(s): HGBA1C in the last 72 hours. CBG: Recent Labs  Lab 08/16/18 0737 08/16/18 1124 08/16/18 1620 08/16/18 2221 08/17/18 0804  GLUCAP 171* 142* 200* 219* 305*   Lipid Profile: No results for input(s): CHOL, HDL, LDLCALC, TRIG, CHOLHDL, LDLDIRECT in the last 72 hours. Thyroid Function Tests: No results for input(s): TSH, T4TOTAL, FREET4, T3FREE, THYROIDAB in the last 72 hours. Anemia Panel: Recent Labs    08/16/18 0500 08/17/18 0502  FERRITIN 1,158* 1,037*   Urine analysis:    Component Value Date/Time   COLORURINE YELLOW (A) 08/06/2018 1145   APPEARANCEUR HAZY (A) 07/16/2018 1145   APPEARANCEUR Hazy 04/14/2013 1852   LABSPEC 1.021 08/08/2018 1145   LABSPEC 1.013 04/14/2013 1852   PHURINE 5.0 08/05/2018 1145   GLUCOSEU NEGATIVE 07/21/2018 1145   GLUCOSEU 50 mg/dL 04/14/2013 1852   HGBUR SMALL (A) 08/09/2018 1145   BILIRUBINUR NEGATIVE 07/23/2018 1145   BILIRUBINUR Negative 04/14/2013 1852   KETONESUR NEGATIVE 07/27/2018 1145    PROTEINUR NEGATIVE 07/25/2018 1145   NITRITE NEGATIVE 08/13/2018 1145   LEUKOCYTESUR NEGATIVE 07/22/2018 1145   LEUKOCYTESUR Negative 04/14/2013 1852   Recent Results (from the past 240 hour(s))  SARS Coronavirus 2 (CEPHEID- Performed in [place] hospital lab), Hosp Order     Status: Abnormal   Collection Time: 07/22/2018 11:02 AM  Result Value Ref Range Status   SARS Coronavirus 2 POSITIVE (A) NEGATIVE Final    Comment: RESULT CALLED TO, READ BACK BY AND VERIFIED WITH: HUNTER ORE AT 1229 08/12/2018.PMF (NOTE) If result is NEGATIVE SARS-CoV-2 target nucleic acids are NOT DETECTED. The SARS-CoV-2 RNA is  generally detectable in upper and lower  respiratory specimens during the acute phase of infection. The lowest  concentration of SARS-CoV-2 viral copies this assay can detect is 250  copies / mL. A negative result does not preclude SARS-CoV-2 infection  and should not be used as the sole basis for treatment or other  patient management decisions.  A negative result may occur with  improper specimen collection / handling, submission of specimen other  than nasopharyngeal swab, presence of viral mutation(s) within the  areas targeted by this assay, and inadequate number of viral copies  (<250 copies / mL). A negative result must be combined with clinical  observations, patient history, and epidemiological information. If result is POSITIVE SARS-CoV-2 target nucleic acids are DETECTED. The  SARS-CoV-2 RNA is generally detectable in upper and lower  respiratory specimens during the acute phase of infection.  Positive  results are indicative of active infection with SARS-CoV-2.  Clinical  correlation with patient history and other diagnostic information is  necessary to determine patient infection status.  Positive results do  not rule out bacterial infection or co-infection with other viruses. If result is PRESUMPTIVE POSTIVE SARS-CoV-2 nucleic acids MAY BE PRESENT.   A presumptive  positive result was obtained on the submitted specimen  and confirmed on repeat testing.  While 2019 novel coronavirus  (SARS-CoV-2) nucleic acids may be present in the submitted sample  additional confirmatory testing may be necessary for epidemiological  and / or clinical management purposes  to differentiate between  SARS-CoV-2 and other Sarbecovirus currently known to infect humans.  If clinically indicated additional testing with an alternate test  methodology [phone]) is ad vised. The SARS-CoV-2 RNA is generally  detectable in upper and lower respiratory specimens during the acute  phase of infection. The expected result is Negative. Fact Sheet for Patients:  StrictlyIdeas.no Fact Sheet for Healthcare Providers: BankingDealers.co.za This test is not yet approved or cleared by the [place] FDA and has been authorized for detection and/or diagnosis of SARS-CoV-2 by FDA under an Emergency Use Authorization (EUA).  This EUA will remain in effect (meaning this test can be used) for the duration of the COVID-19 declaration under Section 564(b)(1) of the Act, 21 U.S.C. section 360bbb-3(b)(1), unless the authorization is terminated or revoked sooner. Performed at [facility], [address] [place], [place] [address]   Blood Culture (routine x 2)     Status: None (Preliminary result)   Collection Time: 07/29/2018 11:45 AM  Result Value Ref Range Status   Specimen Description BLOOD R FA  Final   Special Requests   Final    BOTTLES DRAWN AEROBIC AND ANAEROBIC Blood Culture results may not be optimal due to an inadequate volume of blood received in culture bottles   Culture   Final    NO GROWTH 3 DAYS Performed at [facility], [address], [place], [place] [address]    Report Status PENDING  Incomplete  Blood Culture (routine x 2)     Status: None (Preliminary result)   Collection Time: 07/22/2018 11:46 AM   Result Value Ref Range Status   Specimen Description BLOOD RAC  Final   Special Requests   Final    BOTTLES DRAWN AEROBIC AND ANAEROBIC Blood Culture adequate volume   Culture   Final    NO GROWTH 3 DAYS Performed at [facility], [address] [place], [place] [address]    Report Status PENDING  Incomplete      Radiology  Studies: No results found.  Scheduled Meds: . insulin aspart  0-5 Units Subcutaneous QHS  . insulin aspart  0-9 Units Subcutaneous TID WC  . nitroGLYCERIN  0.5 inch Topical Q6H   Continuous Infusions: . dextrose 5 % with KCl 20 mEq / L 100 mL/hr at 08/17/18 0500  . heparin 650 Units/hr (08/17/18 0500)     LOS: 3 days   Time spent: 35 minutes.  Patrecia Pour, MD [facility] [email or web address] Password [facility] 08/17/2018, 10:21 AM

## 2018-08-17 NOTE — Progress Notes (Signed)
Inpatient Diabetes Program Recommendations  AACE/ADA: New Consensus Statement on Inpatient Glycemic Control (2015)  Target Ranges:  Prepandial:   less than 140 mg/dL      Peak postprandial:   less than 180 mg/dL (1-2 hours)      Critically ill patients:  140 - 180 mg/dL   Results for Melinda Hughes, Melinda Hughes (MRN [id number]) as of 08/17/2018 09:21  Ref. Range 08/16/2018 07:37 08/16/2018 11:24 08/16/2018 16:20 08/16/2018 22:21 08/17/2018 08:04  Glucose-Capillary Latest Ref Range: 70 - 99 mg/dL 171 (H) 142 (H) 200 (H) 219 (H) 305 (H)   Review of Glycemic Control  Diabetes history: None  Current orders for Inpatient glycemic control: Novolog 0-9 units tid, Novolog 0-5 units qhs  Inpatient Diabetes Program Recommendations:    Glucose trends increasing. PO intake poor. Consider increasing Novolog Correction scale to Novolog 0-15 units Q4 hours.  Thanks,  Tama Headings RN, MSN, BC-ADM Inpatient Diabetes Coordinator Team Pager [phone] (8a-5p)

## 2018-08-18 LAB — COMPREHENSIVE METABOLIC PANEL
ALT: 19 U/L (ref 0–44)
AST: 23 U/L (ref 15–41)
Albumin: 2.2 g/dL — ABNORMAL LOW (ref 3.5–5.0)
Alkaline Phosphatase: 51 U/L (ref 38–126)
Anion gap: 6 (ref 5–15)
BUN: 66 mg/dL — ABNORMAL HIGH (ref 8–23)
CO2: 19 mmol/L — ABNORMAL LOW (ref 22–32)
Calcium: 8.5 mg/dL — ABNORMAL LOW (ref 8.9–10.3)
Chloride: 120 mmol/L — ABNORMAL HIGH (ref 98–111)
Creatinine, Ser: 0.89 mg/dL (ref 0.44–1.00)
GFR calc Af Amer: >60 mL/min (ref >60)
GFR calc non Af Amer: 60 mL/min — ABNORMAL LOW (ref >60)
Glucose, Bld: 229 mg/dL — ABNORMAL HIGH (ref 70–99)
Potassium: 3.7 mmol/L (ref 3.5–5.1)
Sodium: 145 mmol/L (ref 135–145)
Total Bilirubin: 0.6 mg/dL (ref 0.3–1.2)
Total Protein: 5.5 g/dL — ABNORMAL LOW (ref 6.5–8.1)

## 2018-08-18 LAB — CBC WITH DIFFERENTIAL/PLATELET
Abs Immature Granulocytes: 0.11 10*3/uL — ABNORMAL HIGH (ref 0.00–0.07)
Basophils Absolute: 0 10*3/uL (ref 0.0–0.1)
Basophils Relative: 0 %
Eosinophils Absolute: 0 10*3/uL (ref 0.0–0.5)
Eosinophils Relative: 0 %
HCT: 36.4 % (ref 36.0–46.0)
Hemoglobin: 11.2 g/dL — ABNORMAL LOW (ref 12.0–15.0)
Immature Granulocytes: 1 %
Lymphocytes Relative: 8 %
Lymphs Abs: 0.6 10*3/uL — ABNORMAL LOW (ref 0.7–4.0)
MCH: 29.1 pg (ref 26.0–34.0)
MCHC: 30.8 g/dL (ref 30.0–36.0)
MCV: 94.5 fL (ref 80.0–100.0)
Monocytes Absolute: 0.2 10*3/uL (ref 0.1–1.0)
Monocytes Relative: 2 %
Neutro Abs: 6.8 10*3/uL (ref 1.7–7.7)
Neutrophils Relative %: 89 %
Platelets: 238 10*3/uL (ref 150–400)
RBC: 3.85 MIL/uL — ABNORMAL LOW (ref 3.87–5.11)
RDW: 14.8 % (ref 11.5–15.5)
WBC: 7.7 10*3/uL (ref 4.0–10.5)
nRBC: 0.5 % — ABNORMAL HIGH (ref 0.0–0.2)

## 2018-08-18 LAB — BLOOD CULTURE ID PANEL (REFLEXED)

## 2018-08-18 LAB — MAGNESIUM: Magnesium: 2 mg/dL (ref 1.7–2.4)

## 2018-08-18 LAB — GLUCOSE, CAPILLARY
Glucose-Capillary: 174 mg/dL — ABNORMAL HIGH (ref 70–99)
Glucose-Capillary: 194 mg/dL — ABNORMAL HIGH (ref 70–99)
Glucose-Capillary: 211 mg/dL — ABNORMAL HIGH (ref 70–99)

## 2018-08-18 LAB — PHOSPHORUS: Phosphorus: 2.7 mg/dL (ref 2.5–4.6)

## 2018-08-18 LAB — FERRITIN: Ferritin: 975 ng/mL — ABNORMAL HIGH (ref 11–307)

## 2018-08-18 LAB — C-REACTIVE PROTEIN: CRP: 7.3 mg/dL — ABNORMAL HIGH (ref <1.0)

## 2018-08-18 LAB — D-DIMER, QUANTITATIVE: D-Dimer, Quant: 1.43 ug/mL-FEU — ABNORMAL HIGH (ref 0.00–0.50)

## 2018-08-18 MED ORDER — FENTANYL CITRATE (PF) 100 MCG/2ML IJ SOLN: 25.0000 ug | INTRAMUSCULAR | Status: DC | PRN

## 2018-08-18 MED ORDER — FENTANYL CITRATE (PF) 100 MCG/2ML IJ SOLN
25.0000 ug | INTRAMUSCULAR | Status: DC | PRN
  Administered 2018-08-18: 25 ug via INTRAVENOUS
  Administered 2018-08-18 – 2018-08-19 (×3): 50 ug via INTRAVENOUS
  Administered 2018-08-19: 25 ug via INTRAVENOUS
  Administered 2018-08-20 – 2018-08-21 (×4): 50 ug via INTRAVENOUS
  Filled 2018-08-18 (×10): qty 2

## 2018-08-18 MED ORDER — LORAZEPAM 2 MG/ML IJ SOLN
1.0000 mg | INTRAMUSCULAR | Status: DC | PRN
  Administered 2018-08-18: 1 mg via INTRAVENOUS
  Administered 2018-08-18 – 2018-08-23 (×5): 2 mg via INTRAVENOUS
  Administered 2018-08-23: 1 mg via INTRAVENOUS
  Filled 2018-08-18 (×8): qty 1

## 2018-08-18 NOTE — Progress Notes (Signed)
[place] TEAM 1 - Stepdown/ICU TEAM  Melinda Hughes  FYB:[id number] DOB: 26-Aug-1931 DOA: 07/22/2018 PCP: Kirk Ruths, MD    Brief Narrative:  [phone] w/ a hx ofdementia, DM, HTN, and uterine cancer who presented to the ED from her SNF for AMS. She tested positive for SARS-CoV-2 at her facility. She was hypoxic and on NRB at time of presentation.   In the ED her O2 was able to be weaned down, but she was found to be in renal failure with hypernatremia and lactic acidosis.   Significant Events: 5/30 admit via [facility] ED  COVID-19 specific Treatment: none  Subjective: The patient has had recurrent severe hypoxia this morning.  Even with transitioning to a nonrebreather mask it was transiently difficult to keep her saturations within the target range.  At the time of my visit her respirations have settled down somewhat.  She is in no apparent acute distress.  She is noncommunicative.  She does not appear to be experiencing any uncontrolled pain.  Assessment & Plan:  COVID PNA - acute hypoxic respiratory failure family understand the prognosis is guarded - she is not a candidate for COVID specific tx given her short life expectancy - we have transitioned to comfort focused care today given her clinical decline   Acute metabolic encephalopathy on chronic dementia due to viral infection, hypernatremia, and renal failure - noncommunicative today   Acute renal failure Renal fxn has improved w/ simple hydration   Recent Labs  Lab 08/15/18 0012 08/15/18 1140 08/16/18 0650 08/17/18 0502 08/18/18 0545  CREATININE 2.07* 1.87* 1.38* 1.13* 0.89    Hypernatremia Due to simple dehydration - Na is now normal   Hypophosphatemia Due to very poor intake - corrected   DM2 Strict CBG control not indicated at this time   Elevated troponin due to demand ischemia - no STEMI on ECG - allergy to aspirin and morphine  HTN  Iron deficiency  Multiple Pressure Injuries  All were present  on admit - see prior documentation   DVT prophylaxis: comfort care only  Code Status: DNR - NO CODE Family Communication:   Disposition Plan:   Consultants:  none  Antimicrobials:  None    Objective: Blood pressure (!) 110/57, pulse 65, temperature (!) 96.4 F (35.8 C), temperature source Axillary, resp. rate (!) 24, SpO2 95 %.  Intake/Output Summary (Last 24 hours) at 08/18/2018 1434 Last data filed at 08/18/2018 1300 Gross per 24 hour  Intake 1572.24 ml  Output 645 ml  Net 927.24 ml   There were no vitals filed for this visit.  Examination: General: No acute respiratory distress - obtunded  Lungs: poor inspiratory effort - no labored breathing  Cardiovascular: bradycardic - no M  Abdomen: soft, bs hypoactive  Extremities: No signif edema B LE   CBC: Recent Labs  Lab 08/16/18 0650 08/17/18 0502 08/18/18 0545  WBC 7.9 7.7 7.7  NEUTROABS 6.9 6.8 6.8  HGB 11.8* 11.0* 11.2*  HCT 38.7 36.8 36.4  MCV 96.0 96.1 94.5  PLT 250 260 824   Basic Metabolic Panel: Recent Labs  Lab 08/16/18 0650 08/17/18 0502 08/18/18 0545  NA 159* 154* 145  K 3.5 3.7 3.7  CL >130* 125* 120*  CO2 21* 21* 19*  GLUCOSE 196* 272* 229*  BUN 107* 88* 66*  CREATININE 1.38* 1.13* 0.89  CALCIUM 9.2 8.9 8.5*  MG 2.5* 2.3 2.0  PHOS 1.7* 1.3* 2.7   GFR: CrCl cannot be calculated (Unknown ideal weight.).  Liver Function Tests:  Recent Labs  Lab 08/15/18 0012 08/16/18 0650 08/17/18 0502 08/18/18 0545  AST 28 24 21 23   ALT 23 19 19 19   ALKPHOS 60 64 51 51  BILITOT 0.5 0.6 0.5 0.6  PROT 6.8 6.0* 5.6* 5.5*  ALBUMIN 2.5* 2.4* 2.2* 2.2*   Recent Labs  Lab 07/29/2018 1102  LIPASE 36    Coagulation Profile: Recent Labs  Lab 07/21/2018 1102  INR 1.6*    Cardiac Enzymes: Recent Labs  Lab 07/31/2018 1825 08/15/18 0012 08/15/18 0700 08/16/18 0650  TROPONINI 0.24* 0.20* 0.24* 0.09*    CBG: Recent Labs  Lab 08/17/18 1710 08/17/18 2017 08/17/18 2343 08/18/18 0559 08/18/18  0747  GLUCAP 167* 174* 143* 194* 211*    Recent Results (from the past 240 hour(s))  SARS Coronavirus 2 (CEPHEID- Performed in [place] hospital lab), Hosp Order     Status: Abnormal   Collection Time: 08/02/2018 11:02 AM  Result Value Ref Range Status   SARS Coronavirus 2 POSITIVE (A) NEGATIVE Final    Comment: RESULT CALLED TO, READ BACK BY AND VERIFIED WITH: HUNTER ORE AT 1229 08/06/2018.PMF (NOTE) If result is NEGATIVE SARS-CoV-2 target nucleic acids are NOT DETECTED. The SARS-CoV-2 RNA is generally detectable in upper and lower  respiratory specimens during the acute phase of infection. The lowest  concentration of SARS-CoV-2 viral copies this assay can detect is 250  copies / mL. A negative result does not preclude SARS-CoV-2 infection  and should not be used as the sole basis for treatment or other  patient management decisions.  A negative result may occur with  improper specimen collection / handling, submission of specimen other  than nasopharyngeal swab, presence of viral mutation(s) within the  areas targeted by this assay, and inadequate number of viral copies  (<250 copies / mL). A negative result must be combined with clinical  observations, patient history, and epidemiological information. If result is POSITIVE SARS-CoV-2 target nucleic acids are DETECTED. The  SARS-CoV-2 RNA is generally detectable in upper and lower  respiratory specimens during the acute phase of infection.  Positive  results are indicative of active infection with SARS-CoV-2.  Clinical  correlation with patient history and other diagnostic information is  necessary to determine patient infection status.  Positive results do  not rule out bacterial infection or co-infection with other viruses. If result is PRESUMPTIVE POSTIVE SARS-CoV-2 nucleic acids MAY BE PRESENT.   A presumptive positive result was obtained on the submitted specimen  and confirmed on repeat testing.  While 2019 novel  coronavirus  (SARS-CoV-2) nucleic acids may be present in the submitted sample  additional confirmatory testing may be necessary for epidemiological  and / or clinical management purposes  to differentiate between  SARS-CoV-2 and other Sarbecovirus currently known to infect humans.  If clinically indicated additional testing with an alternate test  methodology [phone]) is ad vised. The SARS-CoV-2 RNA is generally  detectable in upper and lower respiratory specimens during the acute  phase of infection. The expected result is Negative. Fact Sheet for Patients:  StrictlyIdeas.no Fact Sheet for Healthcare Providers: BankingDealers.co.za This test is not yet approved or cleared by the [place] FDA and has been authorized for detection and/or diagnosis of SARS-CoV-2 by FDA under an Emergency Use Authorization (EUA).  This EUA will remain in effect (meaning this test can be used) for the duration of the COVID-19 declaration under Section 564(b)(1) of the Act, 21 U.S.C. section 360bbb-3(b)(1), unless the authorization is terminated or revoked sooner. Performed at  [facility], [address] [place], [place] [address]   Blood Culture (routine x 2)     Status: None (Preliminary result)   Collection Time: 07/23/2018 11:45 AM  Result Value Ref Range Status   Specimen Description BLOOD R FA  Final   Special Requests   Final    BOTTLES DRAWN AEROBIC AND ANAEROBIC Blood Culture results may not be optimal due to an inadequate volume of blood received in culture bottles   Culture   Final    NO GROWTH 4 DAYS Performed at [facility], [address], [place], [place] [address]    Report Status PENDING  Incomplete  Blood Culture (routine x 2)     Status: None (Preliminary result)   Collection Time: 07/28/2018 11:46 AM  Result Value Ref Range Status   Specimen Description   Final    BLOOD RAC Performed at [facility], [address], [place], [place] [address]    Special Requests   Final    BOTTLES DRAWN AEROBIC AND ANAEROBIC Blood Culture adequate volume Performed at [facility], [address] [place], [place] [address]    Culture  Setup Time   Final    GRAM POSITIVE COCCI AEROBIC BOTTLE ONLY CRITICAL RESULT CALLED TO, READ BACK BY AND VERIFIED WITH: CHRIS WILLIAMS ON 08/18/2018 AT 1443 QSD Performed at [facility], [place] [address], [place], [place] [address]    Culture GRAM POSITIVE COCCI  Final   Report Status PENDING  Incomplete  Blood Culture ID Panel (Reflexed)     Status: None   Collection Time: 08/08/2018 11:46 AM  Result Value Ref Range Status   Enterococcus species NOT DETECTED NOT DETECTED Final   Listeria monocytogenes NOT DETECTED NOT DETECTED Final   Staphylococcus species NOT DETECTED NOT DETECTED Final   Staphylococcus aureus (BCID) NOT DETECTED NOT DETECTED Final   Streptococcus species NOT DETECTED NOT DETECTED Final   Streptococcus agalactiae NOT DETECTED NOT DETECTED Final   Streptococcus pneumoniae NOT DETECTED NOT DETECTED Final   Streptococcus pyogenes NOT DETECTED NOT DETECTED Final   Acinetobacter baumannii NOT DETECTED NOT DETECTED Final   Enterobacteriaceae species NOT DETECTED NOT DETECTED Final   Enterobacter cloacae complex NOT DETECTED NOT DETECTED Final   Escherichia coli NOT DETECTED NOT DETECTED Final   Klebsiella oxytoca NOT DETECTED NOT DETECTED Final   Klebsiella pneumoniae NOT DETECTED NOT DETECTED Final   Proteus species NOT DETECTED NOT DETECTED Final   Serratia marcescens NOT DETECTED NOT DETECTED Final   Haemophilus influenzae NOT DETECTED NOT DETECTED Final   Neisseria meningitidis NOT DETECTED NOT DETECTED Final   Pseudomonas aeruginosa NOT DETECTED NOT DETECTED Final   Candida albicans NOT DETECTED NOT DETECTED Final   Candida glabrata NOT DETECTED NOT DETECTED Final   Candida krusei NOT DETECTED NOT DETECTED Final   Candida  parapsilosis NOT DETECTED NOT DETECTED Final   Candida tropicalis NOT DETECTED NOT DETECTED Final    Comment: Performed at [facility], [address] [place], [place] [address]     Scheduled Meds:  Continuous Infusions: . dextrose 10 mL/hr at 08/18/18 0956     LOS: 4 days   Cherene Altes, MD [facility] Office  [phone] Pager - Text Page per Shea Evans  If 7PM-7AM, please contact night-coverage per Amion 08/18/2018, 2:34 PM

## 2018-08-18 NOTE — Progress Notes (Addendum)
Pt sats 100% non rebreather placed on 5 L [place] sats down to low 80"s reapplied non rebreather notified Dr Thereasa Solo. No new orders 0950 Notified MD down to 41 and ranging 41-49. See new orders. 53 notified family- daughter reported patiens condition. Informed her she can come and see patient now that she is comfort care. Will cordinate with nurse leaders.

## 2018-08-18 NOTE — Progress Notes (Signed)
Pt pulse Ox went down to 74%. Non breather applied , pulse Ox-92%. Dr. Sherral Hammers was made aware ( via [facility] text message). Awaiting for any possible orders.

## 2018-08-18 NOTE — Progress Notes (Signed)
Date and time results received: 08/18/18  0219  Test: blood cultures  Critical Value:  + blood culture, gram + cocci in 1 of 4 bottles, no ID of organism  Name of Provider Notified: Dr. Sherral Hammers (via [facility] text message)  Orders Received? Or Actions Taken?: messaged current covering physician with results, awaiting any possible orders

## 2018-08-19 LAB — CULTURE, BLOOD (ROUTINE X 2): Culture: NO GROWTH

## 2018-08-19 NOTE — Progress Notes (Signed)
Patient's daughter [name]) called and was updated. She didn't want to facetime with patient because she didn't want to see her mom unresponsive but I did put her on speaker phone to say whatever she wanted to say to her mother.

## 2018-08-19 NOTE — Progress Notes (Signed)
[place] TEAM 1 - Stepdown/ICU TEAM  Melinda Hughes  Melinda DOB: 12-21-1931 DOA: 08/04/2018 PCP: Kirk Ruths, MD    Brief Narrative:  [phone] w/ a hx ofdementia, DM, HTN, and uterine cancer who presented to the ED from her SNF for AMS. She tested positive for SARS-CoV-2 at her facility. She was hypoxic and on NRB at time of presentation.   In the ED her O2 was able to be weaned down, but she was found to be in renal failure with hypernatremia and lactic acidosis.   Significant Events: 5/30 admit via [facility] ED  COVID-19 specific Treatment: none  Subjective: [name] appears to be comfortable.  She does not respond to my exam or voice.  She does not appear to be in respiratory distress and there is no evidence of uncontrolled pain.  Her respirations are extremely shallow and her heart rate is consistently around 40.  Assessment & Plan:  COVID PNA - acute hypoxic respiratory failure family understand the prognosis is guarded - she is not a candidate for COVID specific tx given her short life expectancy - we have transitioned to comfort focused care - she appears to be actively dying    Acute metabolic encephalopathy on chronic dementia due to viral infection, hypernatremia, and renal failure - noncommunicative at this time  Acute renal failure Renal fxn improved w/ simple hydration   Recent Labs  Lab 08/15/18 0012 08/15/18 1140 08/16/18 0650 08/17/18 0502 08/18/18 0545  CREATININE 2.07* 1.87* 1.38* 1.13* 0.89    Hypernatremia Due to simple dehydration  Hypophosphatemia Due to very poor intake   DM2 Strict CBG control not indicated at this time   Elevated troponin due to demand ischemia - no STEMI on ECG - allergy to aspirin and morphine  HTN  Iron deficiency  Multiple Pressure Injuries  All were present on admit - see prior documentation   DVT prophylaxis: comfort care only  Code Status: DNR - NO CODE Family Communication:   Disposition Plan:    Consultants:  none  Antimicrobials:  None    Objective: Blood pressure (!) 59/34, pulse (!) 50, temperature (!) 92.6 F (33.7 C), temperature source Rectal, resp. rate (!) 27, SpO2 (!) 81 %.  Intake/Output Summary (Last 24 hours) at 08/19/2018 0951 Last data filed at 08/19/2018 0600 Gross per 24 hour  Intake 323.06 ml  Output 700 ml  Net -376.94 ml   There were no vitals filed for this visit.  Examination: General: Respirations agonal Lungs: poor inspiratory effort  Cardiovascular: bradycardic  Extremities: no signif edema B LE   CBC: Recent Labs  Lab 08/16/18 0650 08/17/18 0502 08/18/18 0545  WBC 7.9 7.7 7.7  NEUTROABS 6.9 6.8 6.8  HGB 11.8* 11.0* 11.2*  HCT 38.7 36.8 36.4  MCV 96.0 96.1 94.5  PLT 250 260 646   Basic Metabolic Panel: Recent Labs  Lab 08/16/18 0650 08/17/18 0502 08/18/18 0545  NA 159* 154* 145  K 3.5 3.7 3.7  CL >130* 125* 120*  CO2 21* 21* 19*  GLUCOSE 196* 272* 229*  BUN 107* 88* 66*  CREATININE 1.38* 1.13* 0.89  CALCIUM 9.2 8.9 8.5*  MG 2.5* 2.3 2.0  PHOS 1.7* 1.3* 2.7   GFR: CrCl cannot be calculated (Unknown ideal weight.).  Liver Function Tests: Recent Labs  Lab 08/15/18 0012 08/16/18 0650 08/17/18 0502 08/18/18 0545  AST 28 24 21 23   ALT 23 19 19 19   ALKPHOS 60 64 51 51  BILITOT 0.5 0.6 0.5 0.6  PROT 6.8 6.0* 5.6* 5.5*  ALBUMIN 2.5* 2.4* 2.2* 2.2*   Recent Labs  Lab 08/09/2018 1102  LIPASE 36    Coagulation Profile: Recent Labs  Lab 07/21/2018 1102  INR 1.6*    Cardiac Enzymes: Recent Labs  Lab 08/10/2018 1825 08/15/18 0012 08/15/18 0700 08/16/18 0650  TROPONINI 0.24* 0.20* 0.24* 0.09*    CBG: Recent Labs  Lab 08/17/18 1710 08/17/18 2017 08/17/18 2343 08/18/18 0559 08/18/18 0747  GLUCAP 167* 174* 143* 194* 211*    Recent Results (from the past 240 hour(s))  SARS Coronavirus 2 (CEPHEID- Performed in [place] hospital lab), Hosp Order     Status: Abnormal   Collection Time: 08/13/2018 11:02 AM   Result Value Ref Range Status   SARS Coronavirus 2 POSITIVE (A) NEGATIVE Final    Comment: RESULT CALLED TO, READ BACK BY AND VERIFIED WITH: HUNTER ORE AT 1229 08/04/2018.PMF (NOTE) If result is NEGATIVE SARS-CoV-2 target nucleic acids are NOT DETECTED. The SARS-CoV-2 RNA is generally detectable in upper and lower  respiratory specimens during the acute phase of infection. The lowest  concentration of SARS-CoV-2 viral copies this assay can detect is 250  copies / mL. A negative result does not preclude SARS-CoV-2 infection  and should not be used as the sole basis for treatment or other  patient management decisions.  A negative result may occur with  improper specimen collection / handling, submission of specimen other  than nasopharyngeal swab, presence of viral mutation(s) within the  areas targeted by this assay, and inadequate number of viral copies  (<250 copies / mL). A negative result must be combined with clinical  observations, patient history, and epidemiological information. If result is POSITIVE SARS-CoV-2 target nucleic acids are DETECTED. The  SARS-CoV-2 RNA is generally detectable in upper and lower  respiratory specimens during the acute phase of infection.  Positive  results are indicative of active infection with SARS-CoV-2.  Clinical  correlation with patient history and other diagnostic information is  necessary to determine patient infection status.  Positive results do  not rule out bacterial infection or co-infection with other viruses. If result is PRESUMPTIVE POSTIVE SARS-CoV-2 nucleic acids MAY BE PRESENT.   A presumptive positive result was obtained on the submitted specimen  and confirmed on repeat testing.  While 2019 novel coronavirus  (SARS-CoV-2) nucleic acids may be present in the submitted sample  additional confirmatory testing may be necessary for epidemiological  and / or clinical management purposes  to differentiate between  SARS-CoV-2 and  other Sarbecovirus currently known to infect humans.  If clinically indicated additional testing with an alternate test  methodology [id number]) is ad vised. The SARS-CoV-2 RNA is generally  detectable in upper and lower respiratory specimens during the acute  phase of infection. The expected result is Negative. Fact Sheet for Patients:  StrictlyIdeas.no Fact Sheet for Healthcare Providers: BankingDealers.co.za This test is not yet approved or cleared by the [place] FDA and has been authorized for detection and/or diagnosis of SARS-CoV-2 by FDA under an Emergency Use Authorization (EUA).  This EUA will remain in effect (meaning this test can be used) for the duration of the COVID-19 declaration under Section 564(b)(1) of the Act, 21 U.S.C. section 360bbb-3(b)(1), unless the authorization is terminated or revoked sooner. Performed at [facility], [address] [place],  [address]   Blood Culture (routine x 2)     Status: None   Collection Time: 08/15/2018 11:45 AM  Result Value Ref Range Status  Specimen Description BLOOD R FA  Final   Special Requests   Final    BOTTLES DRAWN AEROBIC AND ANAEROBIC Blood Culture results may not be optimal due to an inadequate volume of blood received in culture bottles   Culture   Final    NO GROWTH 5 DAYS Performed at [facility], [address], [place], [place] [address]    Report Status 08/19/2018 FINAL  Final  Blood Culture (routine x 2)     Status: None (Preliminary result)   Collection Time: 07/27/2018 11:46 AM  Result Value Ref Range Status   Specimen Description   Final    BLOOD RAC Performed at [facility], [address] [place], [place] [address]    Special Requests   Final    BOTTLES DRAWN AEROBIC AND ANAEROBIC Blood Culture adequate volume Performed at [facility], [address] [place], [place] [address]    Culture  Setup Time    Final    GRAM POSITIVE COCCI AEROBIC BOTTLE ONLY CRITICAL RESULT CALLED TO, READ BACK BY AND VERIFIED WITH: CHRIS WILLIAMS ON 08/18/2018 AT 7989 QSD Performed at [facility], [place] [address], [place], [place] [address]    Culture GRAM POSITIVE COCCI  Final   Report Status PENDING  Incomplete  Blood Culture ID Panel (Reflexed)     Status: None   Collection Time: 08/02/2018 11:46 AM  Result Value Ref Range Status   Enterococcus species NOT DETECTED NOT DETECTED Final   Listeria monocytogenes NOT DETECTED NOT DETECTED Final   Staphylococcus species NOT DETECTED NOT DETECTED Final   Staphylococcus aureus (BCID) NOT DETECTED NOT DETECTED Final   Streptococcus species NOT DETECTED NOT DETECTED Final   Streptococcus agalactiae NOT DETECTED NOT DETECTED Final   Streptococcus pneumoniae NOT DETECTED NOT DETECTED Final   Streptococcus pyogenes NOT DETECTED NOT DETECTED Final   Acinetobacter baumannii NOT DETECTED NOT DETECTED Final   Enterobacteriaceae species NOT DETECTED NOT DETECTED Final   Enterobacter cloacae complex NOT DETECTED NOT DETECTED Final   Escherichia coli NOT DETECTED NOT DETECTED Final   Klebsiella oxytoca NOT DETECTED NOT DETECTED Final   Klebsiella pneumoniae NOT DETECTED NOT DETECTED Final   Proteus species NOT DETECTED NOT DETECTED Final   Serratia marcescens NOT DETECTED NOT DETECTED Final   Haemophilus influenzae NOT DETECTED NOT DETECTED Final   Neisseria meningitidis NOT DETECTED NOT DETECTED Final   Pseudomonas aeruginosa NOT DETECTED NOT DETECTED Final   Candida albicans NOT DETECTED NOT DETECTED Final   Candida glabrata NOT DETECTED NOT DETECTED Final   Candida krusei NOT DETECTED NOT DETECTED Final   Candida parapsilosis NOT DETECTED NOT DETECTED Final   Candida tropicalis NOT DETECTED NOT DETECTED Final    Comment: Performed at [facility], [address] [place], [place] [address]       LOS: 5 days   Cherene Altes, MD [facility] Office  [phone] Pager - Text Page per Amion  If 7PM-7AM, please contact night-coverage per Amion 08/19/2018, 9:51 AM

## 2018-08-19 NOTE — Progress Notes (Signed)
Reached out to the patients daughter gwen to give her updates on her mothers condition. Offered to do face time and even a possible goodbye visit but patients daughter declined both. She stated that she face times patient last night and it was difficult to see her mother like that. Informed her that patient is resting comfortably and we are trying our best to keep the patient out of pain.

## 2018-08-19 NOTE — Progress Notes (Signed)
Patient grimacing in pain, Gave another does of fentyl. Will continue to monitor.

## 2018-08-20 LAB — CULTURE, BLOOD (ROUTINE X 2): Special Requests: ADEQUATE

## 2018-08-20 NOTE — Progress Notes (Signed)
Nutrition Brief Note  Chart reviewed due to MST and low Braden scores. Patient is receiving comfort care.  No nutrition interventions warranted at this time.  Please consult as needed.    Molli Barrows, RD, LDN, [place] Pager [phone] After Hours Pager [phone]

## 2018-08-20 NOTE — Progress Notes (Signed)
[place] TEAM 1 - Stepdown/ICU TEAM  Ailen Strauch  ZOX:[id number] DOB: 09-21-1931 DOA: 07/18/2018 PCP: Kirk Ruths, MD    Brief Narrative:  [phone] w/ a hx ofdementia, DM, HTN, and uterine cancer who presented to the ED from her SNF for AMS. She tested positive for SARS-CoV-2 at her facility. She was hypoxic and on NRB at time of presentation.   In the ED her O2 was able to be weaned down, but she was found to be in renal failure with hypernatremia and lactic acidosis.   Significant Events: 5/30 admit via [facility] ED  COVID-19 specific Treatment: none  Subjective: The patient has actually improved slightly since I saw her yesterday.  She is somewhat more awake though she does not interact with me.  Her blood pressure remains low and she continues to have bradycardia.  We will continue comfort care.  I called and spoke with the patient's daughter at length via phone.  I updated her on the current situation.  We both agreed that comfort care remains the appropriate treatment plan and I have assured the patient's daughter that the patient is resting comfortably and in no distress.  I have explained to her that anticipate the patient's death could be at any moment but this certainly is unlikely to be more than a day or 2 away.  Assessment & Plan:  COVID PNA - acute hypoxic respiratory failure family understand the prognosis is guarded - she is not a candidate for COVID specific tx given her short life expectancy - we have transitioned to comfort focused care  Acute metabolic encephalopathy on chronic dementia due to viral infection, hypernatremia, and renal failure - noncommunicative at this time  Acute renal failure Renal fxn initially improved w/ hydration   Hypernatremia Due to simple dehydration  Hypophosphatemia Due to very poor intake   DM2 Strict CBG control not indicated at this time   Elevated troponin due to demand ischemia - no STEMI on ECG - allergy to aspirin and  morphine  HTN  Iron deficiency  Multiple Pressure Injuries  All were present on admit - see prior documentation   DVT prophylaxis: comfort care only  Code Status: DNR - NO CODE Family Communication: spoke w/ daughter via phone  Disposition Plan:   Consultants:  none  Antimicrobials:  None    Objective: Blood pressure (!) 76/39, pulse (!) 37, temperature (!) 96.4 F (35.8 C), temperature source Axillary, resp. rate (!) 36, SpO2 (!) 82 %.  Intake/Output Summary (Last 24 hours) at 08/20/2018 1521 Last data filed at 08/20/2018 0959 Gross per 24 hour  Intake 89.32 ml  Output 350 ml  Net -260.68 ml   There were no vitals filed for this visit.  Examination: General: Respirations very shallow Lungs: no wheezing  Cardiovascular: bradycardic   CBC: Recent Labs  Lab 08/16/18 0650 08/17/18 0502 08/18/18 0545  WBC 7.9 7.7 7.7  NEUTROABS 6.9 6.8 6.8  HGB 11.8* 11.0* 11.2*  HCT 38.7 36.8 36.4  MCV 96.0 96.1 94.5  PLT 250 260 147   Basic Metabolic Panel: Recent Labs  Lab 08/16/18 0650 08/17/18 0502 08/18/18 0545  NA 159* 154* 145  K 3.5 3.7 3.7  CL >130* 125* 120*  CO2 21* 21* 19*  GLUCOSE 196* 272* 229*  BUN 107* 88* 66*  CREATININE 1.38* 1.13* 0.89  CALCIUM 9.2 8.9 8.5*  MG 2.5* 2.3 2.0  PHOS 1.7* 1.3* 2.7   GFR: CrCl cannot be calculated (Unknown ideal weight.).  Liver Function  Tests: Recent Labs  Lab 08/15/18 0012 08/16/18 0650 08/17/18 0502 08/18/18 0545  AST 28 24 21 23   ALT 23 19 19 19   ALKPHOS 60 64 51 51  BILITOT 0.5 0.6 0.5 0.6  PROT 6.8 6.0* 5.6* 5.5*  ALBUMIN 2.5* 2.4* 2.2* 2.2*   Recent Labs  Lab 07/19/2018 1102  LIPASE 36    Coagulation Profile: Recent Labs  Lab 07/22/2018 1102  INR 1.6*    Cardiac Enzymes: Recent Labs  Lab 07/25/2018 1825 08/15/18 0012 08/15/18 0700 08/16/18 0650  TROPONINI 0.24* 0.20* 0.24* 0.09*     LOS: 6 days   Cherene Altes, MD [facility] Office  [phone] Pager - Text Page  per Shea Evans  If 7PM-7AM, please contact night-coverage per Amion 08/20/2018, 3:21 PM

## 2018-08-21 NOTE — Progress Notes (Signed)
[place] TEAM 1 - Stepdown/ICU TEAM  Raeya Merritts  YYT:[id number] DOB: [birth year] DOA: 08/13/2018 PCP: Kirk Ruths, MD    Brief Narrative:  [phone] w/ a hx ofdementia, DM, HTN, and uterine cancer who presented to the ED from her SNF for AMS. She tested positive for SARS-CoV-2 at her facility. She was hypoxic and on NRB at time of presentation.   In the ED her O2 was able to be weaned down, but she was found to be in renal failure with hypernatremia and lactic acidosis.   Significant Events: 5/30 admit via [facility] ED  COVID-19 specific Treatment: none  Subjective: No significant change in her condition. Appears comfortable.   Assessment & Plan:  COVID PNA - acute hypoxic respiratory failure family understand the prognosis is guarded - she is not a candidate for COVID specific tx given her short life expectancy - we have transitioned to comfort focused care  Acute metabolic encephalopathy on chronic dementia due to viral infection, hypernatremia, and renal failure - noncommunicative at this time  Acute renal failure Renal fxn initially improved w/ hydration   Hypernatremia Due to simple dehydration  Hypophosphatemia Due to very poor intake   DM2 Strict CBG control not indicated at this time   Elevated troponin due to demand ischemia - no STEMI on ECG - allergy to aspirin and morphine  HTN  Iron deficiency  Multiple Pressure Injuries  All were present on admit - see prior documentation   DVT prophylaxis: comfort care only  Code Status: DNR - NO CODE Family Communication:   Disposition Plan:   Consultants:  none  Antimicrobials:  None    Objective: Blood pressure 102/64, pulse 82, temperature 98.6 F (37 C), temperature source Axillary, resp. rate 20, height 5\' 5"  (1.651 m), weight 68 kg, SpO2 (!) 82 %.  Intake/Output Summary (Last 24 hours) at 08/21/2018 0830 Last data filed at 08/20/2018 2347 Gross per 24 hour  Intake -  Output 850 ml  Net -850 ml    Filed Weights   08/20/18 2121  Weight: 68 kg    Examination: General: NAD Lungs: no wheezing  Cardiovascular: bradycardic   CBC: Recent Labs  Lab 08/16/18 0650 08/17/18 0502 08/18/18 0545  WBC 7.9 7.7 7.7  NEUTROABS 6.9 6.8 6.8  HGB 11.8* 11.0* 11.2*  HCT 38.7 36.8 36.4  MCV 96.0 96.1 94.5  PLT 250 260 700   Basic Metabolic Panel: Recent Labs  Lab 08/16/18 0650 08/17/18 0502 08/18/18 0545  NA 159* 154* 145  K 3.5 3.7 3.7  CL >130* 125* 120*  CO2 21* 21* 19*  GLUCOSE 196* 272* 229*  BUN 107* 88* 66*  CREATININE 1.38* 1.13* 0.89  CALCIUM 9.2 8.9 8.5*  MG 2.5* 2.3 2.0  PHOS 1.7* 1.3* 2.7   GFR: Estimated Creatinine Clearance: 42.3 mL/min (by C-G formula based on SCr of 0.89 mg/dL).  Liver Function Tests: Recent Labs  Lab 08/15/18 0012 08/16/18 0650 08/17/18 0502 08/18/18 0545  AST 28 24 21 23   ALT 23 19 19 19   ALKPHOS 60 64 51 51  BILITOT 0.5 0.6 0.5 0.6  PROT 6.8 6.0* 5.6* 5.5*  ALBUMIN 2.5* 2.4* 2.2* 2.2*   Recent Labs  Lab 07/19/2018 1102  LIPASE 36    Coagulation Profile: Recent Labs  Lab 07/16/2018 1102  INR 1.6*    Cardiac Enzymes: Recent Labs  Lab 08/10/2018 1825 08/15/18 0012 08/15/18 0700 08/16/18 0650  TROPONINI 0.24* 0.20* 0.24* 0.09*     LOS: 7 days  Cherene Altes, MD [facility] Office  [phone] Pager - Text Page per Amion  If 7PM-7AM, please contact night-coverage per Amion 08/21/2018, 8:30 AM

## 2018-08-21 NOTE — Progress Notes (Signed)
Family was called and updated patients condition. Spoke with Graceann Congress last night.

## 2018-08-22 NOTE — Progress Notes (Signed)
  [place] TEAM 1 - Stepdown/ICU TEAM  Karron Goens  [id number]:[id number] DOB: 12-06-1931 DOA: 08/12/2018 PCP: Kirk Ruths, MD    Brief Narrative:  [phone] w/ a hx ofdementia, DM, HTN, and uterine cancer who presented to the ED from her SNF for AMS. She tested positive for SARS-CoV-2 at her facility. She was hypoxic and on NRB at time of presentation.   In the ED her O2 was able to be weaned down, but she was found to be in renal failure with hypernatremia and lactic acidosis.   Significant Events: 5/30 admit via [facility] ED  COVID-19 specific Treatment: none  Subjective: No significant change in her condition. Appears comfortable.   Assessment & Plan:  COVID PNA - acute hypoxic respiratory failure family understand the prognosis is guarded - she is not a candidate for COVID specific tx given her short life expectancy - cont comfort focused care  Acute metabolic encephalopathy on chronic dementia due to viral infection, hypernatremia, and renal failure - noncommunicative at this time  Acute renal failure Renal fxn initially improved w/ hydration   Hypernatremia Due to simple dehydration  Hypophosphatemia Due to very poor intake   DM2 Strict CBG control not indicated at this time   Elevated troponin due to demand ischemia - no STEMI on ECG - allergy to aspirin and morphine  HTN  Iron deficiency  Multiple Pressure Injuries  All were present on admit - see prior documentation   DVT prophylaxis: comfort care only  Code Status: DNR - NO CODE Family Communication:   Disposition Plan:   Consultants:  none  Antimicrobials:  None    Objective: Blood pressure 114/64, pulse 85, temperature 97.8 F (36.6 C), temperature source Axillary, resp. rate (!) 24, height 5\' 5"  (1.651 m), weight 68 kg, SpO2 90 %.  Intake/Output Summary (Last 24 hours) at 08/22/2018 0752 Last data filed at 08/22/2018 0700 Gross per 24 hour  Intake -  Output 600 ml  Net -600 ml   Filed  Weights   08/20/18 2121  Weight: 68 kg    Examination: General: NAD Lungs: respirations are comfortable  Cardiovascular: RRR  CBC: Recent Labs  Lab 08/16/18 0650 08/17/18 0502 08/18/18 0545  WBC 7.9 7.7 7.7  NEUTROABS 6.9 6.8 6.8  HGB 11.8* 11.0* 11.2*  HCT 38.7 36.8 36.4  MCV 96.0 96.1 94.5  PLT 250 260 798   Basic Metabolic Panel: Recent Labs  Lab 08/16/18 0650 08/17/18 0502 08/18/18 0545  NA 159* 154* 145  K 3.5 3.7 3.7  CL >130* 125* 120*  CO2 21* 21* 19*  GLUCOSE 196* 272* 229*  BUN 107* 88* 66*  CREATININE 1.38* 1.13* 0.89  CALCIUM 9.2 8.9 8.5*  MG 2.5* 2.3 2.0  PHOS 1.7* 1.3* 2.7   GFR: Estimated Creatinine Clearance: 42.3 mL/min (by C-G formula based on SCr of 0.89 mg/dL).  Liver Function Tests: Recent Labs  Lab 08/16/18 0650 08/17/18 0502 08/18/18 0545  AST 24 21 23   ALT 19 19 19   ALKPHOS 64 51 51  BILITOT 0.6 0.5 0.6  PROT 6.0* 5.6* 5.5*  ALBUMIN 2.4* 2.2* 2.2*     LOS: 8 days   Cherene Altes, MD [facility] Office  [phone] Pager - Text Page per Amion  If 7PM-7AM, please contact night-coverage per Amion 08/22/2018, 7:52 AM

## 2018-08-22 NOTE — Progress Notes (Signed)
Called and spoke with patient's daughter at 100hrs, updated on plan of care.  Given my number to call if questions and advised would call if any change in status.

## 2018-08-23 MED ORDER — FENTANYL CITRATE (PF) 100 MCG/2ML IJ SOLN
25.0000 ug | Freq: Once | INTRAMUSCULAR | Status: AC
  Administered 2018-08-23: 25 ug via INTRAVENOUS
  Filled 2018-08-23: qty 2

## 2018-08-23 MED ORDER — MORPHINE SULFATE (PF) 2 MG/ML IV SOLN: 4.0000 mg | Freq: Once | INTRAVENOUS | Status: DC

## 2018-08-23 MED ORDER — FENTANYL CITRATE (PF) 100 MCG/2ML IJ SOLN
25.0000 ug | INTRAMUSCULAR | Status: DC | PRN
  Administered 2018-08-23 – 2018-08-25 (×6): 50 ug via INTRAVENOUS
  Filled 2018-08-23 (×5): qty 2

## 2018-08-23 MED ORDER — MORPHINE SULFATE (PF) 2 MG/ML IV SOLN: 2.0000 mg | INTRAVENOUS | Status: DC | PRN

## 2018-08-23 NOTE — Progress Notes (Signed)
  [place] TEAM 1 - Stepdown/ICU TEAM  Winnifred Dufford  [id number] DOB: 04/10/1931 DOA: 08/07/2018 PCP: Kirk Ruths, MD    Brief Narrative:  [phone] w/ a hx ofdementia, DM, HTN, and uterine cancer who presented to the ED from her SNF for AMS. She tested positive for SARS-CoV-2 at her facility. She was hypoxic and on NRB at time of presentation.   In the ED her O2 was able to be weaned down, but she was found to be in renal failure with hypernatremia and lactic acidosis.   Significant Events: 5/30 admit via [facility] ED  COVID-19 specific Treatment: none  Subjective: The patient appears to be in a state of decline today.  Her respiratory pattern has changed.  There is no evidence of discomfort.  There is no evidence of anxiety.  Assessment & Plan:  COVID PNA - acute hypoxic respiratory failure family understand the prognosis is guarded - she is not a candidate for COVID specific tx given her short life expectancy - cont comfort focused care  Acute metabolic encephalopathy on chronic dementia due to viral infection, hypernatremia, and renal failure - noncommunicative at this time  Acute renal failure Renal fxn initially improved w/ hydration   Hypernatremia Due to simple dehydration  Hypophosphatemia Due to very poor intake   DM2 Strict CBG control not indicated at this time   Elevated troponin due to demand ischemia - no STEMI on ECG - allergy to aspirin and morphine  HTN  Iron deficiency  Multiple Pressure Injuries  All were present on admit - see prior documentation   DVT prophylaxis: comfort care only  Code Status: DNR - NO CODE Family Communication:   Disposition Plan:   Consultants:  none  Antimicrobials:  None    Objective: Blood pressure (!) 105/50, pulse 79, temperature 98.6 F (37 C), temperature source Axillary, resp. rate (!) 25, height 5\' 5"  (1.651 m), weight 68 kg, SpO2 (!) 83 %.  Intake/Output Summary (Last 24 hours) at 08/23/2018 0757  Last data filed at 08/23/2018 0600 Gross per 24 hour  Intake 50 ml  Output 475 ml  Net -425 ml   Filed Weights   08/20/18 2121  Weight: 68 kg    Examination: General: NAD Lungs: respirations are rapid but not labored Cardiovascular: RRR  CBC: Recent Labs  Lab 08/17/18 0502 08/18/18 0545  WBC 7.7 7.7  NEUTROABS 6.8 6.8  HGB 11.0* 11.2*  HCT 36.8 36.4  MCV 96.1 94.5  PLT 260 366   Basic Metabolic Panel: Recent Labs  Lab 08/17/18 0502 08/18/18 0545  NA 154* 145  K 3.7 3.7  CL 125* 120*  CO2 21* 19*  GLUCOSE 272* 229*  BUN 88* 66*  CREATININE 1.13* 0.89  CALCIUM 8.9 8.5*  MG 2.3 2.0  PHOS 1.3* 2.7   GFR: Estimated Creatinine Clearance: 42.3 mL/min (by C-G formula based on SCr of 0.89 mg/dL).  Liver Function Tests: Recent Labs  Lab 08/17/18 0502 08/18/18 0545  AST 21 23  ALT 19 19  ALKPHOS 51 51  BILITOT 0.5 0.6  PROT 5.6* 5.5*  ALBUMIN 2.2* 2.2*     LOS: 9 days   Cherene Altes, MD [facility] Office  [phone] Pager - Text Page per Amion  If 7PM-7AM, please contact night-coverage per Amion 08/23/2018, 7:57 AM

## 2018-08-24 MED ORDER — LORAZEPAM 2 MG/ML IJ SOLN: 1.0000 mg | INTRAMUSCULAR | Status: DC | PRN

## 2018-08-24 NOTE — Progress Notes (Signed)
  [place] TEAM 1 - Stepdown/ICU TEAM  Melinda Hughes  MBW:[id number] DOB: Aug 14, 1931 DOA: 07/16/2018 PCP: Kirk Ruths, MD    Brief Narrative:  [phone] w/ a hx ofdementia, DM, HTN, and uterine cancer who presented to the ED from her SNF for AMS. She tested positive for SARS-CoV-2 at her facility. She was hypoxic and on NRB at time of presentation.   In the ED her O2 was able to be weaned down, but she was found to be in renal failure with hypernatremia and lactic acidosis.   Significant Events: 5/30 admit via [facility] ED  COVID-19 specific Treatment: none  Subjective: Respirations very shallow. No evidence of distress of any kind/appears comfortable.  Assessment & Plan:  COVID PNA - acute hypoxic respiratory failure family understand the prognosis is guarded - she was not a candidate for COVID specific tx given her short life expectancy - cont comfort focused care  Acute metabolic encephalopathy on chronic dementia due to viral infection, hypernatremia, and renal failure - noncommunicative at this time  Acute renal failure Renal fxn initially improved w/ hydration   Hypernatremia Due to simple dehydration  Hypophosphatemia Due to very poor intake   DM2 Strict CBG control not indicated at this time   Elevated troponin due to demand ischemia - no STEMI on ECG - allergy to aspirin and morphine  HTN  Iron deficiency  Multiple Pressure Injuries  All were present on admit - see prior documentation   DVT prophylaxis: comfort care only  Code Status: DNR - NO CODE Family Communication:   Disposition Plan: anticipate hospital death within 24-48hrs  Consultants:  none  Antimicrobials:  None    Objective: Blood pressure (!) 40/28, pulse 75, temperature 98.7 F (37.1 C), temperature source Axillary, resp. rate (!) 46, height 5\' 5"  (1.651 m), weight 68 kg, SpO2 (!) 89 %.  Intake/Output Summary (Last 24 hours) at 08/24/2018 1608 Last data filed at 08/24/2018 0600  Gross per 24 hour  Intake 0 ml  Output 200 ml  Net -200 ml   Filed Weights   08/20/18 2121  Weight: 68 kg    Examination: General: NAD Lungs: respirations very shallow - not rapid  Cardiovascular: RRR  CBC: Recent Labs  Lab 08/18/18 0545  WBC 7.7  NEUTROABS 6.8  HGB 11.2*  HCT 36.4  MCV 94.5  PLT 939   Basic Metabolic Panel: Recent Labs  Lab 08/18/18 0545  NA 145  K 3.7  CL 120*  CO2 19*  GLUCOSE 229*  BUN 66*  CREATININE 0.89  CALCIUM 8.5*  MG 2.0  PHOS 2.7   GFR: Estimated Creatinine Clearance: 42.3 mL/min (by C-G formula based on SCr of 0.89 mg/dL).  Liver Function Tests: Recent Labs  Lab 08/18/18 0545  AST 23  ALT 19  ALKPHOS 51  BILITOT 0.6  PROT 5.5*  ALBUMIN 2.2*     LOS: 10 days   Cherene Altes, MD [facility] Office  [phone] Pager - Text Page per Amion  If 7PM-7AM, please contact night-coverage per Amion 08/24/2018, 4:08 PM

## 2018-08-25 MED ORDER — SCOPOLAMINE 1 MG/3DAYS TD PT72
1.0000 | MEDICATED_PATCH | TRANSDERMAL | Status: DC
  Administered 2018-08-25: 1.5 mg via TRANSDERMAL
  Filled 2018-08-25 (×2): qty 1

## 2018-08-25 MED ORDER — MORPHINE BOLUS VIA INFUSION
1.0000 mg | INTRAVENOUS | Status: DC | PRN
  Filled 2018-08-25: qty 1

## 2018-08-25 MED ORDER — MORPHINE 100MG IN NS 100ML (1MG/ML) PREMIX INFUSION
1.0000 mg/h | INTRAVENOUS | Status: DC
  Administered 2018-08-25 – 2018-08-27 (×5): 1 mg/h via INTRAVENOUS
  Filled 2018-08-25: qty 100

## 2018-08-25 NOTE — Progress Notes (Signed)
PROGRESS NOTE  Melinda Hughes YOV:[id number] DOB: 04/11/1931 DOA: 07/30/2018 PCP: Kirk Ruths, MD   LOS: 11 days   Brief Narrative / Interim history: 85 year old female with dementia, diabetes, hypertension, uterine cancer who came from SNF for altered mental status, was admitted to the hospital on 07/25/2018, diagnosed with COVID-19 at her facility.  She was hypoxic and on a nonrebreather at the time of presentation.  She was also found to be in renal failure with hyponatremia as well as lactic acidosis.  Subjective: Eyes are open but does not interact, not tracking me.  Appears uncomfortable and tachypneic  Assessment & Plan: Principal Problem:   Acute respiratory disease due to COVID-19 virus Active Problems:   Acute encephalopathy   Pressure injury of skin   Dementia (HCC)   Hypertension   Acute renal failure (HCC)   Principal Problem Acute Hypoxic Respiratory Failure due to Covid-19 Viral Illness -Very guarded prognosis, after goals of care discussion by Dr. Thereasa Solo, for now focus on comfort.  We will switch to morphine from fentanyl given increased tachypnea and apparent discomfort  COVID-19 Labs  No results for input(s): DDIMER, FERRITIN, LDH, CRP in the last 72 hours.  Lab Results  Component Value Date   SARSCOV2NAA POSITIVE (A) 07/27/2018   Active Problems Acute metabolic encephalopathy, chronic dementia -Multifactorial in the setting of renal failure, viral infection, hyponatremia.  Acute kidney injury -Received fluid initially, kidney function is improved, most recent labs on 6/3.  No focusing on comfort  Hyponatremia -Due to dehydration  Type 2 diabetes mellitus -No focus on comfort  Elevated troponin -Due to demand ischemia  Hypertension  Iron deficiency   Scheduled Meds: Continuous Infusions: . morphine 1 mg/hr (08/25/18 1001)   PRN Meds:.acetaminophen, bisacodyl, LORazepam, nitroGLYCERIN, ondansetron **OR** ondansetron (ZOFRAN) IV  DVT  prophylaxis: comfort measures Code Status: DNR Family Communication:  Disposition Plan: Anticipating hospital death  Consultants:   None  Procedures:   Antimicrobials:  None    Objective: Vitals:   08/25/18 0000 08/25/18 0429 08/25/18 0747 08/25/18 0756  BP:  (!) 99/49 112/60   Pulse:   63   Resp: (!) 37 (!) 38 16 (!) 42  Temp:  99.4 F (37.4 C) 98.1 F (36.7 C)   TempSrc:  Axillary Axillary   SpO2: (!) 79% (!) 82% 91%   Weight:      Height:        Intake/Output Summary (Last 24 hours) at 08/25/2018 1147 Last data filed at 08/25/2018 0600 Gross per 24 hour  Intake 0 ml  Output 390 ml  Net -390 ml   Filed Weights   08/20/18 2121  Weight: 68 kg    Examination:  Constitutional: Laying in bed, nonverbal, tachypneic, eyes are open but not tracking Respiratory: shallow breathing Cardiovascular: RRR  Data Reviewed: I have independently reviewed following labs and imaging studies   CBC: No results for input(s): WBC, NEUTROABS, HGB, HCT, MCV, PLT in the last 168 hours. Basic Metabolic Panel: No results for input(s): NA, K, CL, CO2, GLUCOSE, BUN, CREATININE, CALCIUM, MG, PHOS in the last 168 hours. GFR: Estimated Creatinine Clearance: 42.3 mL/min (by C-G formula based on SCr of 0.89 mg/dL). Liver Function Tests: No results for input(s): AST, ALT, ALKPHOS, BILITOT, PROT, ALBUMIN in the last 168 hours. No results for input(s): LIPASE, AMYLASE in the last 168 hours. No results for input(s): AMMONIA in the last 168 hours. Coagulation Profile: No results for input(s): INR, PROTIME in the last 168 hours. Cardiac Enzymes: No results  for input(s): CKTOTAL, CKMB, CKMBINDEX, TROPONINI in the last 168 hours. BNP (last 3 results) No results for input(s): PROBNP in the last 8760 hours. HbA1C: No results for input(s): HGBA1C in the last 72 hours. CBG: No results for input(s): GLUCAP in the last 168 hours. Lipid Profile: No results for input(s): CHOL, HDL, LDLCALC, TRIG,  CHOLHDL, LDLDIRECT in the last 72 hours. Thyroid Function Tests: No results for input(s): TSH, T4TOTAL, FREET4, T3FREE, THYROIDAB in the last 72 hours. Anemia Panel: No results for input(s): VITAMINB12, FOLATE, FERRITIN, TIBC, IRON, RETICCTPCT in the last 72 hours. Urine analysis:    Component Value Date/Time   COLORURINE YELLOW (A) 08/12/2018 1145   APPEARANCEUR HAZY (A) 07/24/2018 1145   APPEARANCEUR Hazy 04/14/2013 1852   LABSPEC 1.021 08/09/2018 1145   LABSPEC 1.013 04/14/2013 1852   PHURINE 5.0 08/10/2018 1145   GLUCOSEU NEGATIVE 08/08/2018 1145   GLUCOSEU 50 mg/dL 04/14/2013 1852   HGBUR SMALL (A) 07/16/2018 1145   BILIRUBINUR NEGATIVE 08/13/2018 1145   BILIRUBINUR Negative 04/14/2013 1852   KETONESUR NEGATIVE 08/12/2018 1145   PROTEINUR NEGATIVE 07/20/2018 1145   NITRITE NEGATIVE 07/27/2018 [place] 07/16/2018 1145   LEUKOCYTESUR Negative 04/14/2013 1852   Sepsis Labs: Invalid input(s): PROCALCITONIN, LACTICIDVEN  No results found for this or any previous visit (from the past 240 hour(s)).    Radiology Studies: No results found.  Marzetta Board, MD, PhD [facility]  Contact via  [email or web address]  [place] P: [phone]  F: [phone]

## 2018-08-26 NOTE — Progress Notes (Signed)
PROGRESS NOTE  Melinda Hughes HYW:[id number] DOB: [birth year] DOA: 07/24/2018 PCP: Kirk Ruths, MD   LOS: 12 days   Brief Narrative / Interim history: 85 year old female with dementia, diabetes, hypertension, uterine cancer who came from SNF for altered mental status, was admitted to the hospital on 07/16/2018, diagnosed with COVID-19 at her facility.  She was hypoxic and on a nonrebreather at the time of presentation.  She was also found to be in renal failure with hyponatremia as well as lactic acidosis.  Subjective: Appears to have agonal breathing, unresponsive  Assessment & Plan: Principal Problem:   Acute respiratory disease due to COVID-19 virus Active Problems:   Acute encephalopathy   Pressure injury of skin   Dementia (HCC)   Hypertension   Acute renal failure (HCC)   Principal Problem Acute Hypoxic Respiratory Failure due to Covid-19 Viral Illness -Very guarded prognosis, after goals of care discussion by Dr. Thereasa Solo, for now focus on comfort.  Anticipate in hospital death  COVID-19 Labs  No results for input(s): DDIMER, FERRITIN, LDH, CRP in the last 72 hours.  Lab Results  Component Value Date   SARSCOV2NAA POSITIVE (A) 07/25/2018   Active Problems Acute metabolic encephalopathy, chronic dementia -Multifactorial in the setting of renal failure, viral infection, hyponatremia.  Acute kidney injury -Received fluid initially, kidney function is improved, most recent labs on 6/3.  No focusing on comfort  Hyponatremia -Due to dehydration  Type 2 diabetes mellitus -No focus on comfort  Elevated troponin -Due to demand ischemia  Hypertension  Iron deficiency   Scheduled Meds: . scopolamine  1 patch Transdermal Q72H   Continuous Infusions: . morphine 1 mg/hr (08/26/18 0700)   PRN Meds:.acetaminophen, bisacodyl, LORazepam, morphine, nitroGLYCERIN, ondansetron **OR** ondansetron (ZOFRAN) IV  DVT prophylaxis: comfort measures Code Status: DNR  Family Communication:  Disposition Plan: Anticipating hospital death  Consultants:   None  Procedures:   Antimicrobials:  None    Objective: Vitals:   08/25/18 1752 08/25/18 1930 08/25/18 2216 08/26/18 0740  BP:  (!) 79/33 (!) 81/43 (!) 104/47  Pulse:  75  67  Resp: (!) 38 (!) 34  (!) 30  Temp:   99.7 F (37.6 C) 99.3 F (37.4 C)  TempSrc:   Axillary Axillary  SpO2:  (!) 85%  (!) 78%  Weight:      Height:        Intake/Output Summary (Last 24 hours) at 08/26/2018 1052 Last data filed at 08/26/2018 0700 Gross per 24 hour  Intake 22.93 ml  Output 320 ml  Net -297.07 ml   Filed Weights   08/20/18 2121  Weight: 68 kg    Examination:  Constitutional: Eyes closed, unresponsive Respiratory: Tachypneic  Data Reviewed: I have independently reviewed following labs and imaging studies   CBC: No results for input(s): WBC, NEUTROABS, HGB, HCT, MCV, PLT in the last 168 hours. Basic Metabolic Panel: No results for input(s): NA, K, CL, CO2, GLUCOSE, BUN, CREATININE, CALCIUM, MG, PHOS in the last 168 hours. GFR: Estimated Creatinine Clearance: 42.3 mL/min (by C-G formula based on SCr of 0.89 mg/dL). Liver Function Tests: No results for input(s): AST, ALT, ALKPHOS, BILITOT, PROT, ALBUMIN in the last 168 hours. No results for input(s): LIPASE, AMYLASE in the last 168 hours. No results for input(s): AMMONIA in the last 168 hours. Coagulation Profile: No results for input(s): INR, PROTIME in the last 168 hours. Cardiac Enzymes: No results for input(s): CKTOTAL, CKMB, CKMBINDEX, TROPONINI in the last 168 hours. BNP (last 3 results) No results  for input(s): PROBNP in the last 8760 hours. HbA1C: No results for input(s): HGBA1C in the last 72 hours. CBG: No results for input(s): GLUCAP in the last 168 hours. Lipid Profile: No results for input(s): CHOL, HDL, LDLCALC, TRIG, CHOLHDL, LDLDIRECT in the last 72 hours. Thyroid Function Tests: No results for input(s): TSH,  T4TOTAL, FREET4, T3FREE, THYROIDAB in the last 72 hours. Anemia Panel: No results for input(s): VITAMINB12, FOLATE, FERRITIN, TIBC, IRON, RETICCTPCT in the last 72 hours. Urine analysis:    Component Value Date/Time   COLORURINE YELLOW (A) 07/23/2018 1145   APPEARANCEUR HAZY (A) 08/13/2018 1145   APPEARANCEUR Hazy 04/14/2013 1852   LABSPEC 1.021 08/04/2018 1145   LABSPEC 1.013 04/14/2013 1852   PHURINE 5.0 07/29/2018 1145   GLUCOSEU NEGATIVE 08/10/2018 1145   GLUCOSEU 50 mg/dL 04/14/2013 1852   HGBUR SMALL (A) 08/13/2018 1145   BILIRUBINUR NEGATIVE 07/29/2018 1145   BILIRUBINUR Negative 04/14/2013 1852   KETONESUR NEGATIVE 07/31/2018 1145   PROTEINUR NEGATIVE 07/30/2018 1145   NITRITE NEGATIVE 07/29/2018 [place] 08/13/2018 1145   LEUKOCYTESUR Negative 04/14/2013 1852   Sepsis Labs: Invalid input(s): PROCALCITONIN, LACTICIDVEN  No results found for this or any previous visit (from the past 240 hour(s)).    Radiology Studies: No results found.  Marzetta Board, MD, PhD [facility]  Contact via  [email or web address]  [place] P: [phone]  F: [phone]

## 2018-08-26 NOTE — Progress Notes (Signed)
Daughter [name] updated on patient. All questions answered.

## 2018-08-27 MED ORDER — ACETAMINOPHEN 650 MG RE SUPP: 650.0000 mg | Freq: Four times a day (QID) | RECTAL | Status: DC | PRN

## 2018-08-27 NOTE — Progress Notes (Signed)
Progressively declining, anticipating hospital death.  No changes, continue comfort measures.  [place] M. Cruzita Lederer, MD, PhD [facility]  Contact via  [email or web address]  [place] P: [phone]  F: [phone]

## 2018-09-15 NOTE — Discharge Summary (Signed)
Death Summary  Melinda Hughes NAT:[id number] DOB: [birth year] DOA: 09-08-18  PCP: Kirk Ruths, MD  Admit date: 09/08/18 Date of Death: 09-22-18 Time of Death: 14:15 Notification: Kirk Ruths, MD notified of death of 2018-09-22   History of present illness: Melinda Hughes is an 85 y.o. female with a history of dementia, diet-controlled T2DM, HTN, and uterine cancer who presented to the ED from nursing home due to diminished responsiveness in the setting of reported covid-positive test. Due to dementia and diminished responsiveness we are uncertain of the duration and progress of symptoms. She was reportedly hypoxic and on NRB at time of presentation. This was weaned in the ED, though she was found to be in renal failure with hypernatremia and lactic acidosis. Covid positivity confirmed and admission to [facility] requested with IV fluids.   Patient was admitted to the hospital with acute hypoxic respiratory failure in the setting of COVID-19 infection, she received supportive treatment however despite that her condition continued to deteriorate, and after goals of care discussion with family patient was transitioned towards comfort measures.  She passed away on September 22, 2018 at 14: 15  Final Diagnoses:  Acute hypoxic respiratory failure in the setting of COVID-19 viral infection Septic shock Acute renal failure Hyponatremia Non-STEMI Hypertension Dementia Acute metabolic encephalopathy   The results of significant diagnostics from this hospitalization (including imaging, microbiology, ancillary and laboratory) are listed below for reference.    Significant Diagnostic Studies: Dg Chest Portable 1 View  Result Date: 2018-09-08 CLINICAL DATA:  Hypoxia in a patient who has tested positive for COVID-19. EXAM: PORTABLE CHEST 1 VIEW COMPARISON:  Single-view of the chest 07/31/2016 and 07/21/2016. PA and lateral chest 10/18/2015. FINDINGS: Single nodular opacities in the lower lobes bilaterally  are stable in appearance since the 2018 exams. No consolidative process, pneumothorax or effusion. Heart size is normal. No acute bony abnormality. IMPRESSION: No acute disease. No change in bilateral lower lobe nodular opacities since the 2018 studies. Electronically Signed   By: Inge Rise M.D.   On: 09/08/18 11:51    Microbiology: No results found for this or any previous visit (from the past 240 hour(s)).   Labs: Basic Metabolic Panel: No results for input(s): NA, K, CL, CO2, GLUCOSE, BUN, CREATININE, CALCIUM, MG, PHOS in the last 168 hours. Liver Function Tests: No results for input(s): AST, ALT, ALKPHOS, BILITOT, PROT, ALBUMIN in the last 168 hours. No results for input(s): LIPASE, AMYLASE in the last 168 hours. No results for input(s): AMMONIA in the last 168 hours. CBC: No results for input(s): WBC, NEUTROABS, HGB, HCT, MCV, PLT in the last 168 hours. Cardiac Enzymes: No results for input(s): CKTOTAL, CKMB, CKMBINDEX, TROPONINI in the last 168 hours. D-Dimer No results for input(s): DDIMER in the last 72 hours. BNP: Invalid input(s): POCBNP CBG: No results for input(s): GLUCAP in the last 168 hours. Anemia work up No results for input(s): VITAMINB12, FOLATE, FERRITIN, TIBC, IRON, RETICCTPCT in the last 72 hours. Urinalysis    Component Value Date/Time   COLORURINE YELLOW (A) 2018/09/08 1145   APPEARANCEUR HAZY (A) 08-Sep-2018 1145   APPEARANCEUR Hazy 04/14/2013 1852   LABSPEC 1.021 09-08-18 1145   LABSPEC 1.013 04/14/2013 1852   PHURINE 5.0 Sep 08, 2018 1145   GLUCOSEU NEGATIVE 08-Sep-2018 1145   GLUCOSEU 50 mg/dL 04/14/2013 1852   HGBUR SMALL (A) 09-08-18 1145   BILIRUBINUR NEGATIVE 2018-09-08 1145   BILIRUBINUR Negative 04/14/2013 [place] 2018-09-08 1145   PROTEINUR NEGATIVE September 08, 2018 1145   NITRITE NEGATIVE  08/03/2018 1145   LEUKOCYTESUR NEGATIVE 08/01/2018 1145   LEUKOCYTESUR Negative 04/14/2013 1852   Sepsis Labs Invalid input(s):  PROCALCITONIN,  WBC,  LACTICIDVEN   SIGNED:  Marzetta Board, MD  [facility] 2018-09-23, 2:32 PM Pager   If 7PM-7AM, please contact night-coverage [email or web address] Password [email or web address]

## 2018-09-15 NOTE — Progress Notes (Signed)
Progressively declining, anticipating hospital death.  No changes, continue comfort measures.  Claritza July M. Cruzita Lederer, MD, PhD [facility]  Contact via  [email or web address]  [place] P: [phone]  F: [phone]

## 2018-09-15 NOTE — Progress Notes (Signed)
TOD 1415.  Pronounced with Despina Arias, RN.  Physician notified.  Family notified.

## 2018-09-15 NOTE — Progress Notes (Signed)
I witnessed Wonda Cheng, RN waste 25 ml of morphine.

## 2018-09-15 NOTE — Progress Notes (Addendum)
25 ml from morphine gtt wasted with witness Nadyne Coombes, Therapist, sports.

## 2018-09-15 DEATH — deceased

## 2020-07-21 IMAGING — DX PORTABLE CHEST - 1 VIEW
1 series · 1 of 1 positions shown · non-contrast
Comparison: Single-view of the chest 07/31/2016 and 07/21/2016. PA
and lateral chest 10/18/2015.

CLINICAL DATA: Hypoxia in a patient who has tested positive for
[id number].

EXAM:
PORTABLE CHEST 1 VIEW

[chest ap]
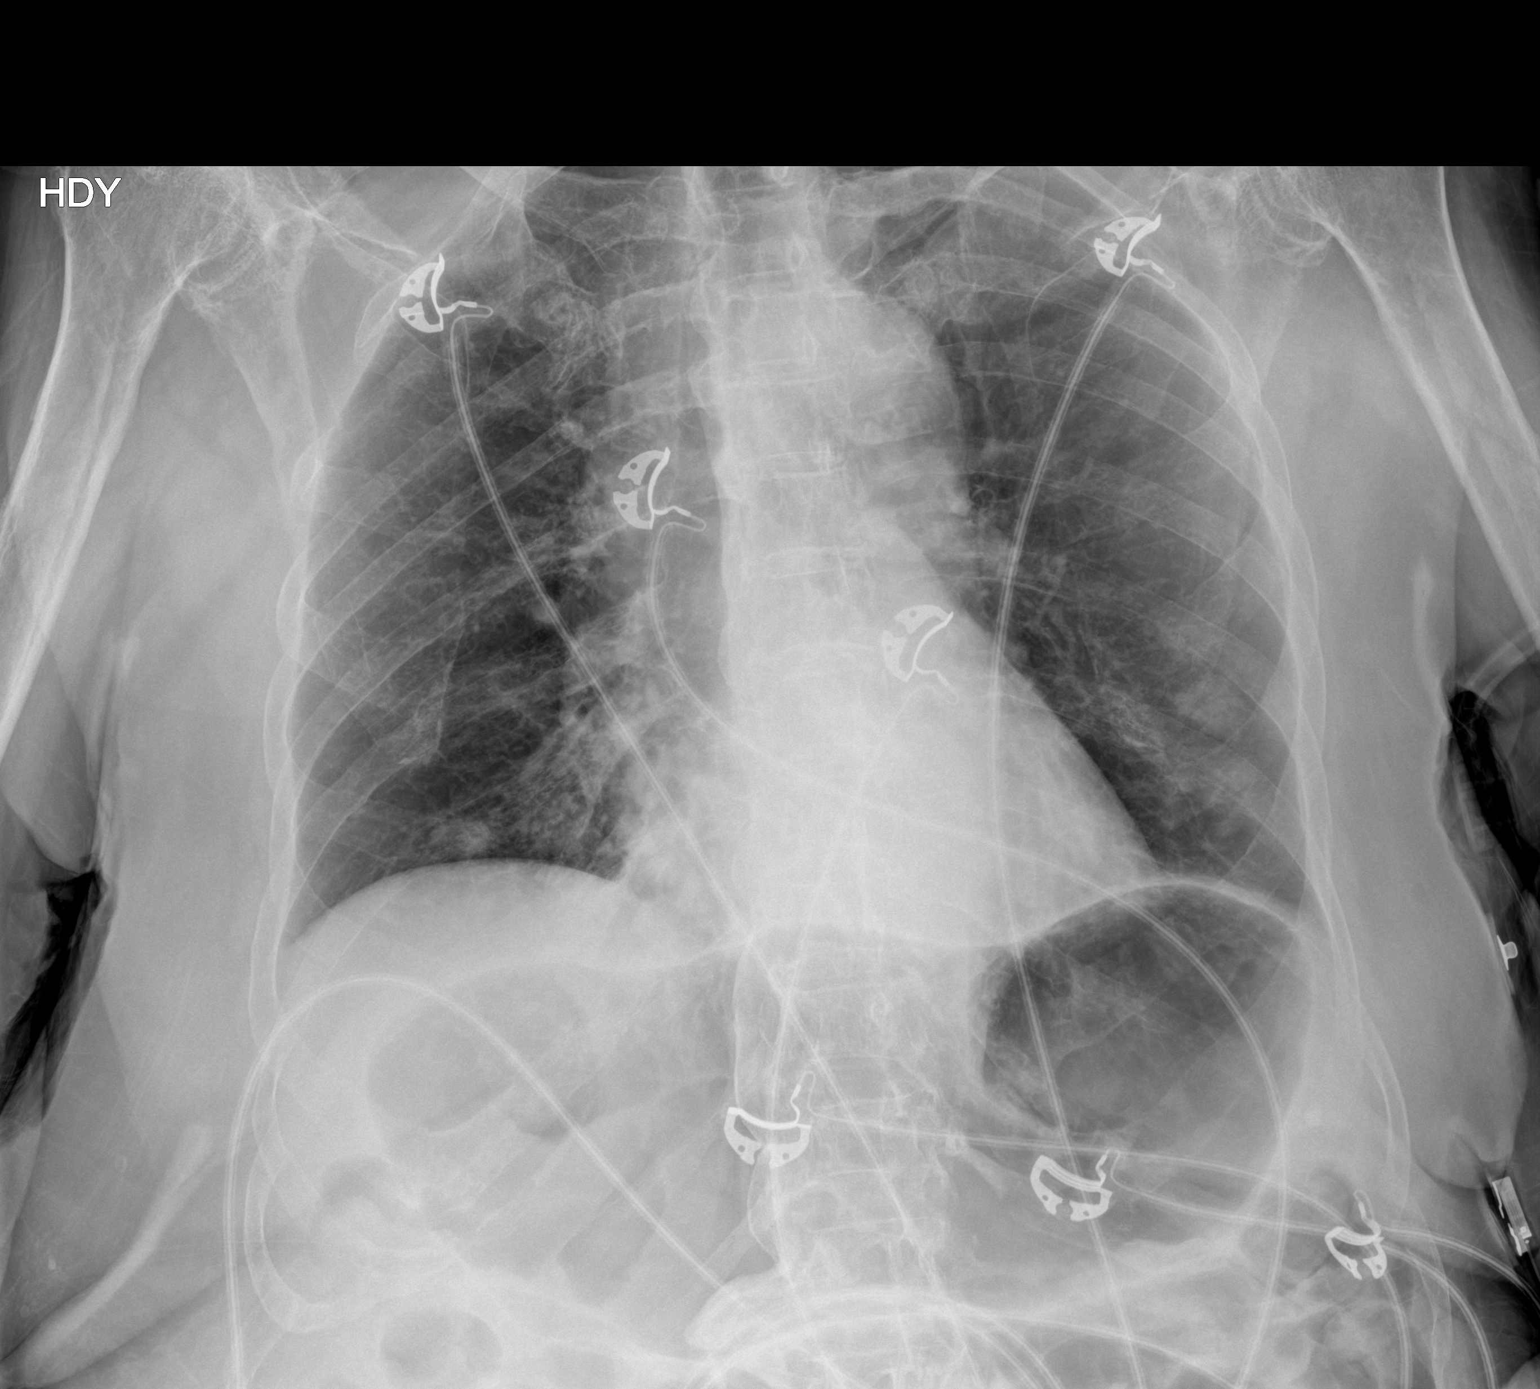

[1 of 1 positions shown; findings below may reference images not displayed]

FINDINGS: Single nodular opacities in the lower lobes bilaterally are stable
in appearance since the 0337 exams. No consolidative process,
pneumothorax or effusion. Heart size is normal. No acute bony
abnormality.
IMPRESSION: No acute disease.

No change in bilateral lower lobe nodular opacities since the 0337
studies.
